# Patient Record
Sex: Female | Born: 1990 | Race: White | Hispanic: No | Marital: Single | Smoking: Current every day smoker
Health system: Southern US, Community
[De-identification: ages and names within clinical notes are randomized; demographics above are authoritative.]

## PROBLEM LIST (undated history)

## (undated) ENCOUNTER — Inpatient Hospital Stay (HOSPITAL_COMMUNITY): Payer: Self-pay

## (undated) DIAGNOSIS — O24419 Gestational diabetes mellitus in pregnancy, unspecified control: Secondary | ICD-10-CM

## (undated) DIAGNOSIS — F329 Major depressive disorder, single episode, unspecified: Secondary | ICD-10-CM

## (undated) DIAGNOSIS — F419 Anxiety disorder, unspecified: Secondary | ICD-10-CM

## (undated) DIAGNOSIS — D649 Anemia, unspecified: Secondary | ICD-10-CM

## (undated) DIAGNOSIS — IMO0002 Reserved for concepts with insufficient information to code with codable children: Secondary | ICD-10-CM

## (undated) DIAGNOSIS — R569 Unspecified convulsions: Secondary | ICD-10-CM

## (undated) DIAGNOSIS — J45909 Unspecified asthma, uncomplicated: Secondary | ICD-10-CM

## (undated) DIAGNOSIS — M797 Fibromyalgia: Secondary | ICD-10-CM

## (undated) DIAGNOSIS — R12 Heartburn: Secondary | ICD-10-CM

## (undated) DIAGNOSIS — O26899 Other specified pregnancy related conditions, unspecified trimester: Secondary | ICD-10-CM

## (undated) DIAGNOSIS — F32A Depression, unspecified: Secondary | ICD-10-CM

## (undated) DIAGNOSIS — K625 Hemorrhage of anus and rectum: Secondary | ICD-10-CM

## (undated) HISTORY — DX: Depression, unspecified: F32.A

## (undated) HISTORY — PX: CHOLECYSTECTOMY: SHX55

## (undated) HISTORY — DX: Hemorrhage of anus and rectum: K62.5

## (undated) HISTORY — DX: Anemia, unspecified: D64.9

## (undated) HISTORY — PX: WISDOM TOOTH EXTRACTION: SHX21

## (undated) HISTORY — DX: Fibromyalgia: M79.7

## (undated) HISTORY — DX: Reserved for concepts with insufficient information to code with codable children: IMO0002

## (undated) HISTORY — DX: Gestational diabetes mellitus in pregnancy, unspecified control: O24.419

## (undated) HISTORY — DX: Major depressive disorder, single episode, unspecified: F32.9

---
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
## 2008-02-26 ENCOUNTER — Emergency Department: Payer: Self-pay | Admitting: Emergency Medicine

## 2008-04-16 HISTORY — PX: COLONOSCOPY: SHX174

## 2008-05-12 ENCOUNTER — Emergency Department: Payer: Self-pay | Admitting: Internal Medicine

## 2008-05-26 ENCOUNTER — Emergency Department: Payer: Self-pay | Admitting: Emergency Medicine

## 2008-06-10 ENCOUNTER — Encounter: Payer: Self-pay | Admitting: Maternal & Fetal Medicine

## 2008-06-11 ENCOUNTER — Other Ambulatory Visit: Payer: Self-pay

## 2008-06-11 ENCOUNTER — Emergency Department: Payer: Self-pay

## 2008-06-14 ENCOUNTER — Ambulatory Visit: Payer: Self-pay | Admitting: Maternal & Fetal Medicine

## 2008-06-14 ENCOUNTER — Encounter: Payer: Self-pay | Admitting: Maternal & Fetal Medicine

## 2008-06-21 ENCOUNTER — Encounter: Payer: Self-pay | Admitting: Maternal & Fetal Medicine

## 2008-06-28 ENCOUNTER — Emergency Department: Payer: Self-pay | Admitting: Unknown Physician Specialty

## 2008-06-30 ENCOUNTER — Ambulatory Visit: Payer: Self-pay | Admitting: Maternal & Fetal Medicine

## 2008-07-02 ENCOUNTER — Emergency Department: Payer: Self-pay | Admitting: Emergency Medicine

## 2008-07-07 ENCOUNTER — Ambulatory Visit: Payer: Self-pay | Admitting: Gynecology

## 2008-07-15 ENCOUNTER — Other Ambulatory Visit: Payer: Self-pay | Admitting: Gynecology

## 2008-07-15 ENCOUNTER — Emergency Department (HOSPITAL_COMMUNITY): Admission: EM | Admit: 2008-07-15 | Discharge: 2008-07-16 | Payer: Self-pay | Admitting: Emergency Medicine

## 2008-07-26 ENCOUNTER — Other Ambulatory Visit: Admission: RE | Admit: 2008-07-26 | Discharge: 2008-07-26 | Payer: Self-pay | Admitting: Obstetrics & Gynecology

## 2008-07-26 ENCOUNTER — Ambulatory Visit: Payer: Self-pay | Admitting: Gynecology

## 2008-07-26 ENCOUNTER — Encounter: Payer: Self-pay | Admitting: Obstetrics & Gynecology

## 2008-08-04 ENCOUNTER — Ambulatory Visit: Payer: Self-pay | Admitting: Gynecology

## 2008-08-17 ENCOUNTER — Ambulatory Visit (HOSPITAL_COMMUNITY): Admission: RE | Admit: 2008-08-17 | Discharge: 2008-08-17 | Payer: Self-pay | Admitting: Gynecology

## 2008-08-18 ENCOUNTER — Ambulatory Visit: Payer: Self-pay | Admitting: Obstetrics and Gynecology

## 2008-08-27 ENCOUNTER — Inpatient Hospital Stay (HOSPITAL_COMMUNITY): Admission: AD | Admit: 2008-08-27 | Discharge: 2008-08-27 | Payer: Self-pay | Admitting: Obstetrics & Gynecology

## 2008-08-27 ENCOUNTER — Ambulatory Visit: Payer: Self-pay | Admitting: Obstetrics & Gynecology

## 2008-09-01 ENCOUNTER — Ambulatory Visit (HOSPITAL_COMMUNITY): Admission: RE | Admit: 2008-09-01 | Discharge: 2008-09-01 | Payer: Self-pay | Admitting: Gynecology

## 2008-09-23 ENCOUNTER — Ambulatory Visit: Payer: Self-pay | Admitting: Obstetrics & Gynecology

## 2008-10-01 ENCOUNTER — Emergency Department: Payer: Self-pay | Admitting: Emergency Medicine

## 2008-10-12 ENCOUNTER — Ambulatory Visit: Payer: Self-pay | Admitting: Obstetrics & Gynecology

## 2008-10-18 ENCOUNTER — Ambulatory Visit (HOSPITAL_COMMUNITY): Admission: RE | Admit: 2008-10-18 | Discharge: 2008-10-18 | Payer: Self-pay | Admitting: Gynecology

## 2008-10-26 ENCOUNTER — Encounter: Payer: Self-pay | Admitting: Family Medicine

## 2008-10-26 ENCOUNTER — Ambulatory Visit: Payer: Self-pay | Admitting: Obstetrics & Gynecology

## 2008-10-26 LAB — CONVERTED CEMR LAB
HCT: 34.8 % — ABNORMAL LOW (ref 36.0–46.0)
Hemoglobin: 11.3 g/dL — ABNORMAL LOW (ref 12.0–15.0)
MCHC: 32.5 g/dL (ref 30.0–36.0)
MCV: 90.2 fL (ref 78.0–100.0)
Platelets: 239 10*3/uL (ref 150–400)
RBC: 3.86 M/uL — ABNORMAL LOW (ref 3.87–5.11)
RDW: 13 % (ref 11.5–15.5)
WBC: 9.1 10*3/uL (ref 4.0–10.5)

## 2008-11-09 ENCOUNTER — Ambulatory Visit: Payer: Self-pay | Admitting: Family Medicine

## 2008-11-15 ENCOUNTER — Ambulatory Visit (HOSPITAL_COMMUNITY): Admission: RE | Admit: 2008-11-15 | Discharge: 2008-11-15 | Payer: Self-pay | Admitting: Gynecology

## 2008-11-23 ENCOUNTER — Ambulatory Visit: Payer: Self-pay | Admitting: Obstetrics & Gynecology

## 2008-11-29 ENCOUNTER — Ambulatory Visit: Payer: Self-pay | Admitting: Family Medicine

## 2008-12-02 ENCOUNTER — Ambulatory Visit: Payer: Self-pay | Admitting: Family Medicine

## 2008-12-06 ENCOUNTER — Ambulatory Visit: Payer: Self-pay | Admitting: Obstetrics and Gynecology

## 2008-12-06 ENCOUNTER — Ambulatory Visit (HOSPITAL_COMMUNITY): Admission: RE | Admit: 2008-12-06 | Discharge: 2008-12-06 | Payer: Self-pay | Admitting: Obstetrics & Gynecology

## 2008-12-09 ENCOUNTER — Ambulatory Visit (HOSPITAL_COMMUNITY): Admission: RE | Admit: 2008-12-09 | Discharge: 2008-12-09 | Payer: Self-pay | Admitting: Obstetrics & Gynecology

## 2008-12-11 ENCOUNTER — Inpatient Hospital Stay (HOSPITAL_COMMUNITY): Admission: AD | Admit: 2008-12-11 | Discharge: 2008-12-12 | Payer: Self-pay | Admitting: Obstetrics & Gynecology

## 2008-12-11 ENCOUNTER — Ambulatory Visit: Payer: Self-pay | Admitting: Advanced Practice Midwife

## 2008-12-13 ENCOUNTER — Ambulatory Visit: Payer: Self-pay | Admitting: Obstetrics and Gynecology

## 2008-12-16 ENCOUNTER — Ambulatory Visit (HOSPITAL_COMMUNITY): Admission: RE | Admit: 2008-12-16 | Discharge: 2008-12-16 | Payer: Self-pay | Admitting: Obstetrics & Gynecology

## 2008-12-20 ENCOUNTER — Ambulatory Visit: Payer: Self-pay | Admitting: Obstetrics and Gynecology

## 2008-12-22 ENCOUNTER — Ambulatory Visit (HOSPITAL_COMMUNITY): Admission: RE | Admit: 2008-12-22 | Discharge: 2008-12-22 | Payer: Self-pay | Admitting: Obstetrics & Gynecology

## 2008-12-27 ENCOUNTER — Ambulatory Visit: Payer: Self-pay | Admitting: Family Medicine

## 2008-12-27 LAB — CONVERTED CEMR LAB
Chlamydia, DNA Probe: NEGATIVE
GC Probe Amp, Genital: NEGATIVE

## 2008-12-28 ENCOUNTER — Encounter: Payer: Self-pay | Admitting: Family Medicine

## 2008-12-30 ENCOUNTER — Inpatient Hospital Stay (HOSPITAL_COMMUNITY): Admission: AD | Admit: 2008-12-30 | Discharge: 2008-12-30 | Payer: Self-pay | Admitting: Obstetrics & Gynecology

## 2009-01-01 ENCOUNTER — Ambulatory Visit: Payer: Self-pay | Admitting: Obstetrics & Gynecology

## 2009-01-01 ENCOUNTER — Inpatient Hospital Stay (HOSPITAL_COMMUNITY): Admission: AD | Admit: 2009-01-01 | Discharge: 2009-01-02 | Payer: Self-pay | Admitting: Obstetrics & Gynecology

## 2009-01-03 ENCOUNTER — Ambulatory Visit: Payer: Self-pay | Admitting: Obstetrics & Gynecology

## 2009-01-04 ENCOUNTER — Ambulatory Visit: Payer: Self-pay | Admitting: Obstetrics and Gynecology

## 2009-01-05 ENCOUNTER — Encounter: Payer: Self-pay | Admitting: Family Medicine

## 2009-01-05 LAB — CONVERTED CEMR LAB
Collection Interval-CRCL: 24 hr
Creatinine 24 HR UR: 997 mg/24hr (ref 700–1800)
Creatinine Clearance: 103 mL/min (ref 75–115)
Creatinine, Urine: 46.4 mg/dL
Protein, Ur: 108 mg/24hr — ABNORMAL HIGH (ref 50–100)

## 2009-01-06 ENCOUNTER — Ambulatory Visit (HOSPITAL_COMMUNITY): Admission: RE | Admit: 2009-01-06 | Discharge: 2009-01-06 | Payer: Self-pay | Admitting: Obstetrics & Gynecology

## 2009-01-07 ENCOUNTER — Ambulatory Visit: Payer: Self-pay | Admitting: Obstetrics and Gynecology

## 2009-01-10 ENCOUNTER — Ambulatory Visit: Payer: Self-pay | Admitting: Obstetrics & Gynecology

## 2009-01-11 ENCOUNTER — Inpatient Hospital Stay (HOSPITAL_COMMUNITY): Admission: RE | Admit: 2009-01-11 | Discharge: 2009-01-15 | Payer: Self-pay | Admitting: Family Medicine

## 2009-01-11 ENCOUNTER — Ambulatory Visit: Payer: Self-pay | Admitting: Family Medicine

## 2009-01-12 ENCOUNTER — Encounter: Payer: Self-pay | Admitting: Family Medicine

## 2009-01-19 ENCOUNTER — Ambulatory Visit: Payer: Self-pay | Admitting: Family Medicine

## 2009-01-20 ENCOUNTER — Encounter: Payer: Self-pay | Admitting: Family Medicine

## 2009-03-01 ENCOUNTER — Ambulatory Visit: Payer: Self-pay | Admitting: Family Medicine

## 2009-05-16 ENCOUNTER — Emergency Department: Payer: Self-pay | Admitting: Emergency Medicine

## 2009-10-26 IMAGING — US US FETAL BPP W/O NONSTRESS
1 series · 18 of 20 positions shown · non-contrast
Comparison: none

OBSTETRICAL ULTRASOUND:
 This ultrasound was performed in The [HOSPITAL], and the [facility] report will be stored to [REDACTED] PACS.

[Series 1: us fetal bpp w/o nonstress · 18 of 20 slices shown]
[im 1/20]
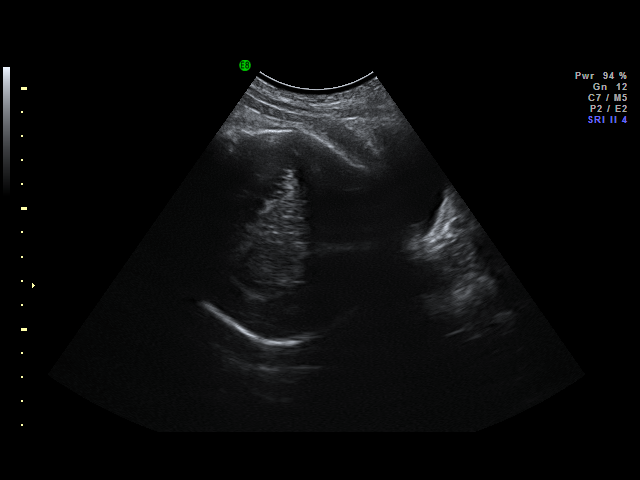
[im 2/20]
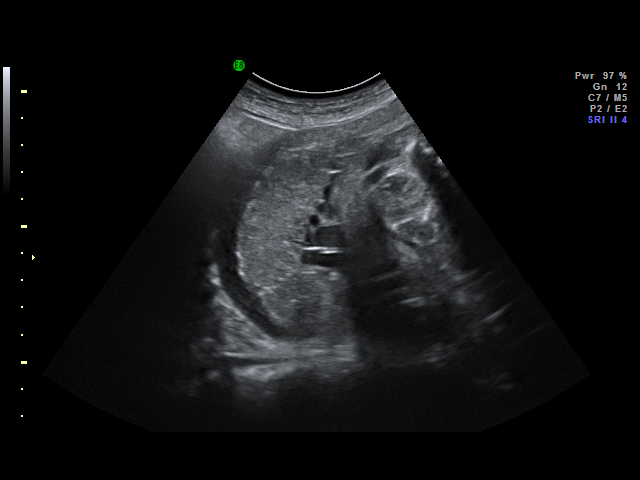
[im 3/20]
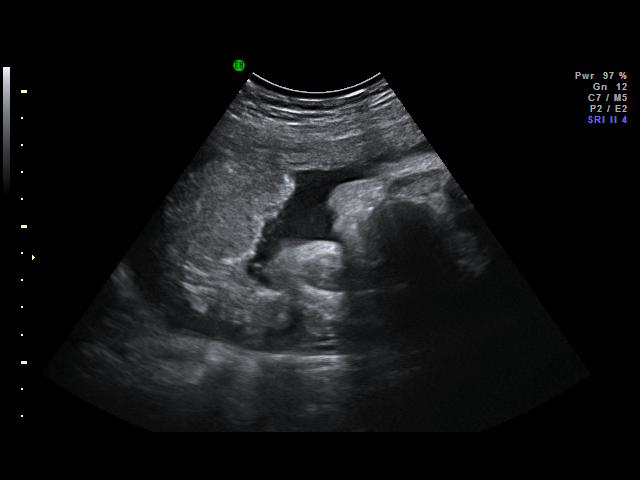
[im 4/20]
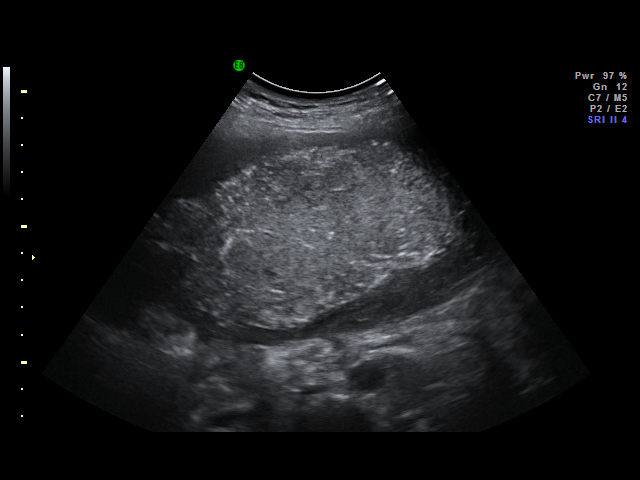
[im 6/20]
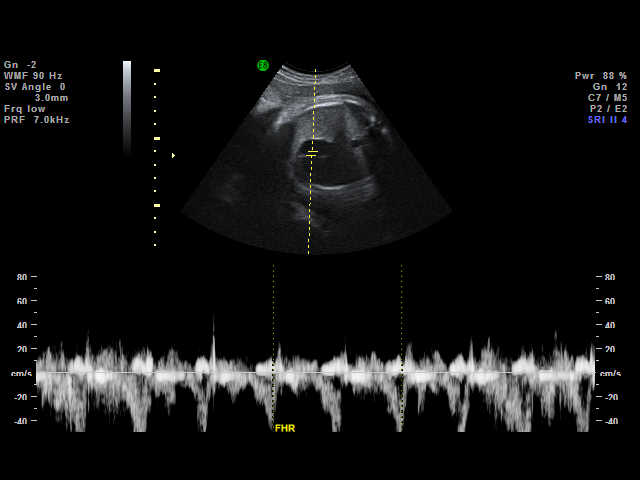
[im 7/20]
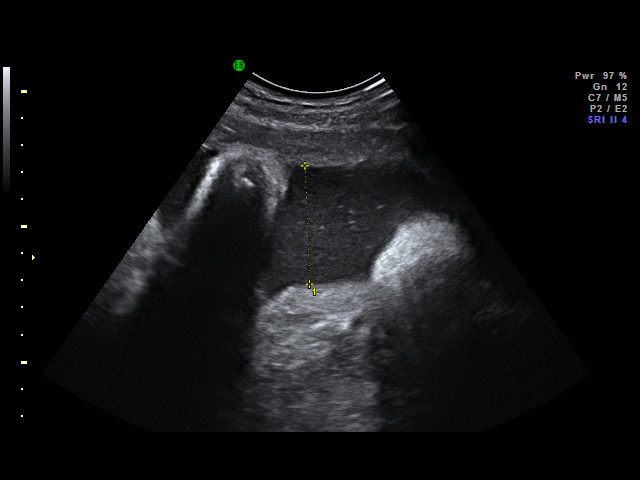
[im 8/20]
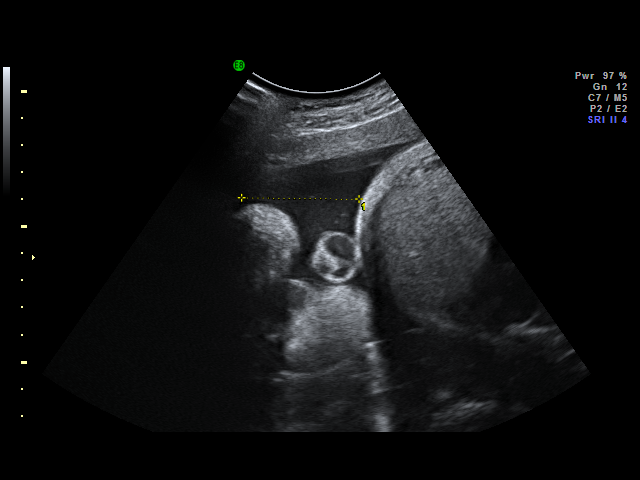
[im 9/20]
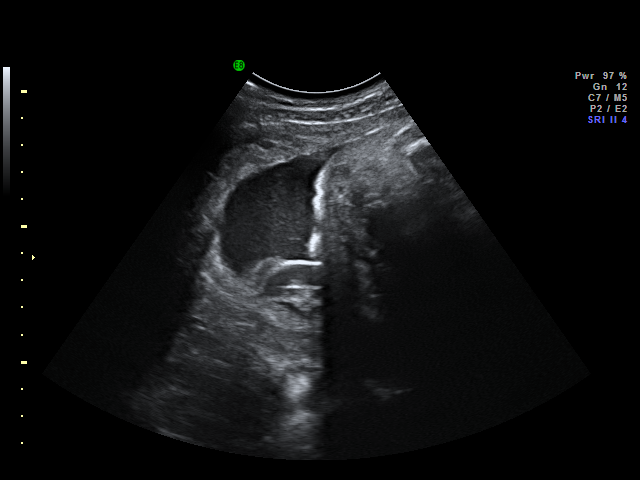
[im 10/20]
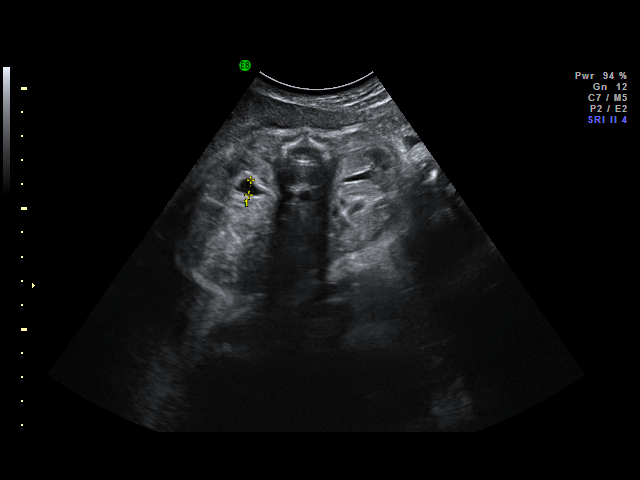
[im 11/20]
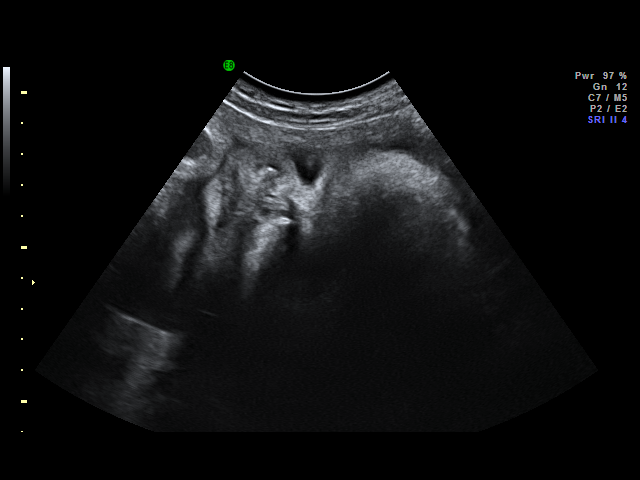
[im 12/20]
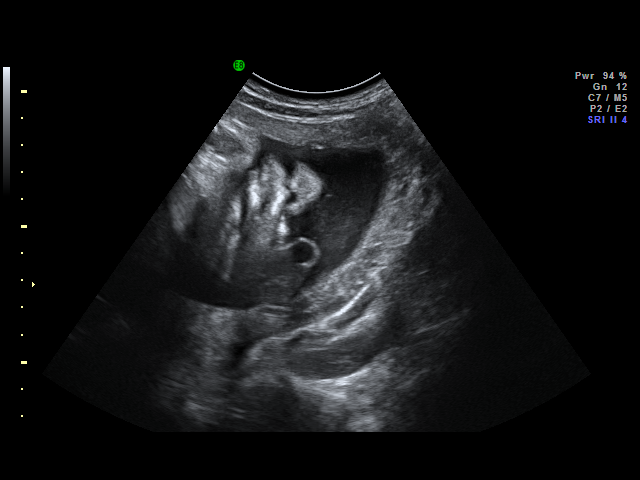
[im 13/20]
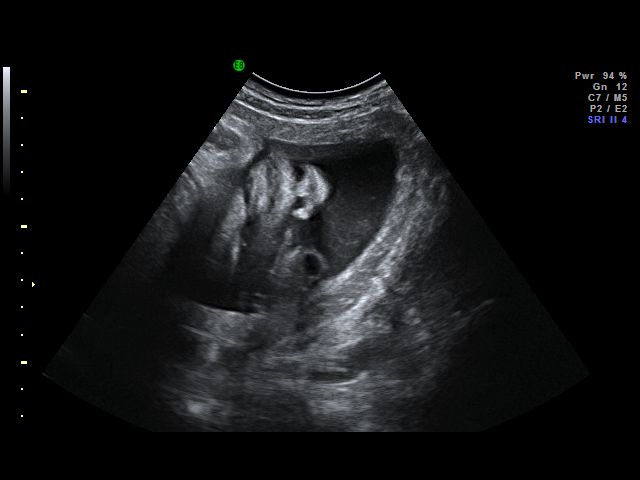
[im 14/20]
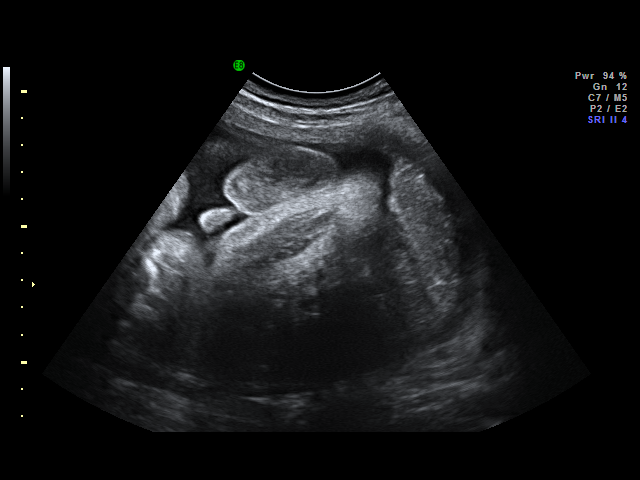
[im 16/20]
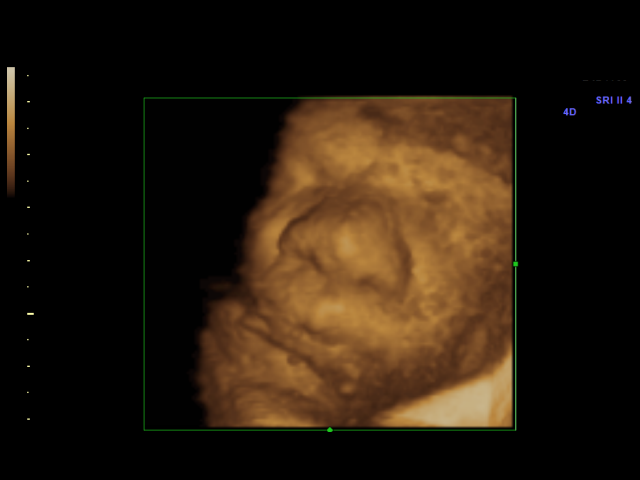
[im 17/20]
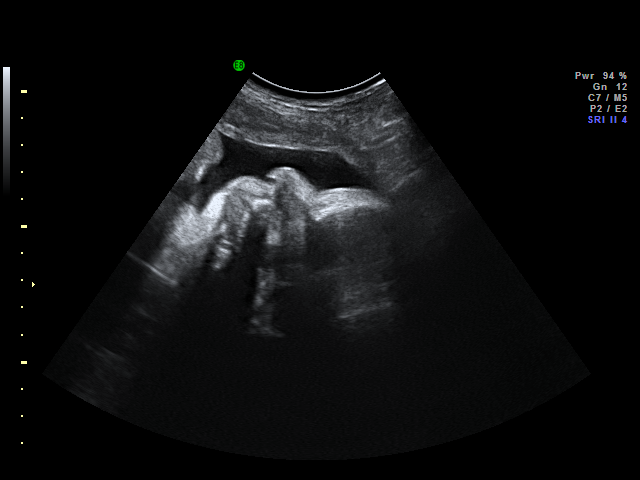
[im 18/20]
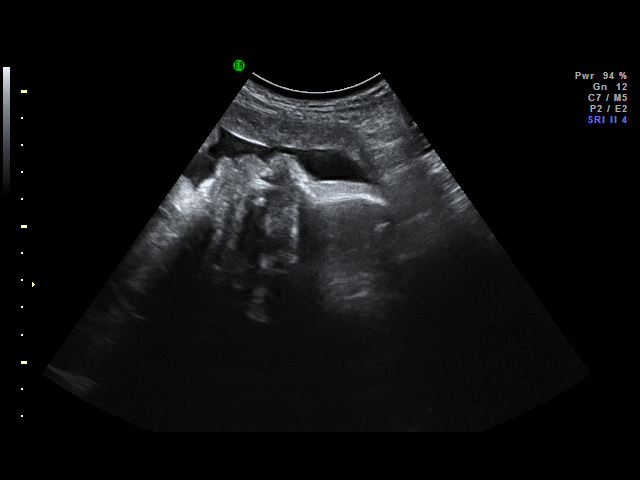
[im 19/20]
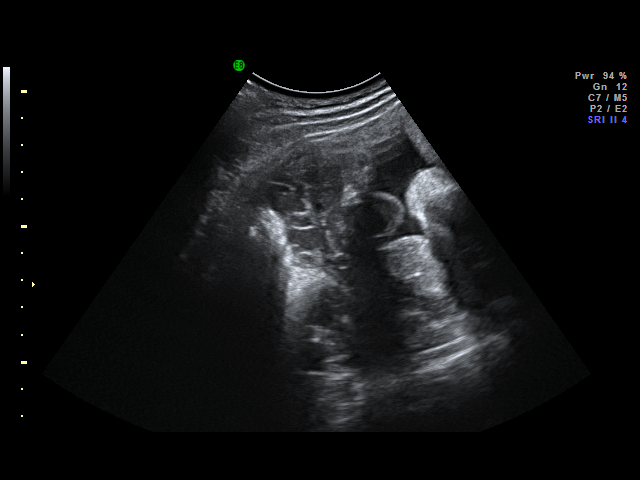
[im 20/20]
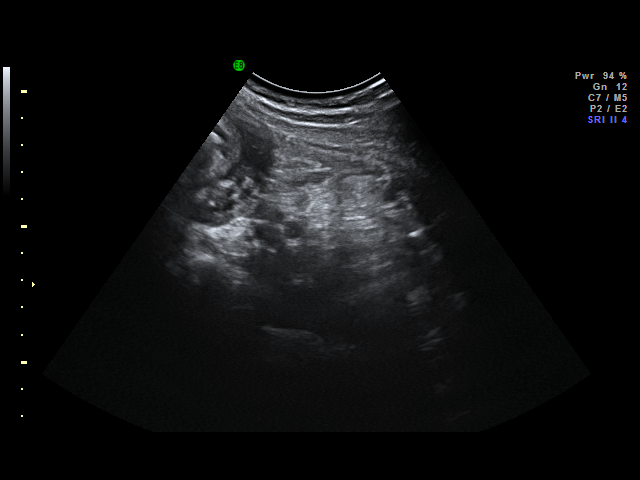

[18 of 20 positions shown; findings below may reference images not displayed]

IMPRESSION: [facility] has also been faxed to the ordering physician.

## 2010-03-04 ENCOUNTER — Emergency Department: Payer: Self-pay | Admitting: Emergency Medicine

## 2010-05-30 ENCOUNTER — Emergency Department: Payer: Self-pay | Admitting: Emergency Medicine

## 2010-06-01 ENCOUNTER — Encounter: Payer: Self-pay | Admitting: Obstetrics & Gynecology

## 2010-06-01 ENCOUNTER — Ambulatory Visit: Payer: Self-pay | Admitting: Obstetrics and Gynecology

## 2010-06-01 LAB — CONVERTED CEMR LAB
Antibody Screen: NEGATIVE
Eosinophils Absolute: 0.4 10*3/uL (ref 0.0–0.7)
Eosinophils Relative: 6 % — ABNORMAL HIGH (ref 0–5)
Hemoglobin: 10.9 g/dL — ABNORMAL LOW (ref 12.0–15.0)
Lymphs Abs: 2.2 10*3/uL (ref 0.7–4.0)
MCHC: 33.3 g/dL (ref 30.0–36.0)
Monocytes Absolute: 0.7 10*3/uL (ref 0.1–1.0)
Monocytes Relative: 9 % (ref 3–12)
RDW: 13.5 % (ref 11.5–15.5)
Rh Type: POSITIVE
Rubella: 137.1 intl units/mL — ABNORMAL HIGH
TSH: 3.027 microintl units/mL (ref 0.350–4.500)

## 2010-06-08 ENCOUNTER — Ambulatory Visit: Payer: Self-pay | Admitting: Obstetrics and Gynecology

## 2010-06-08 ENCOUNTER — Other Ambulatory Visit: Admission: RE | Admit: 2010-06-08 | Discharge: 2010-06-08 | Payer: Self-pay | Admitting: Obstetrics and Gynecology

## 2010-06-08 ENCOUNTER — Encounter: Payer: Self-pay | Admitting: Obstetrics & Gynecology

## 2010-06-08 LAB — CONVERTED CEMR LAB: hCG, Beta Chain, Quant, S: 16407.7 milliintl units/mL

## 2010-06-26 ENCOUNTER — Ambulatory Visit: Payer: Self-pay | Admitting: Obstetrics and Gynecology

## 2010-06-26 LAB — CONVERTED CEMR LAB
Ferritin: 14 ng/mL (ref 10–291)
Folate: >20 ng/mL

## 2010-07-07 ENCOUNTER — Encounter: Payer: Self-pay | Admitting: Cardiovascular Disease

## 2010-07-11 ENCOUNTER — Ambulatory Visit: Payer: Self-pay | Admitting: Obstetrics & Gynecology

## 2010-07-31 ENCOUNTER — Ambulatory Visit: Payer: Self-pay | Admitting: Obstetrics and Gynecology

## 2010-08-04 ENCOUNTER — Ambulatory Visit (HOSPITAL_COMMUNITY): Admission: RE | Admit: 2010-08-04 | Discharge: 2010-08-04 | Payer: Self-pay | Admitting: Obstetrics & Gynecology

## 2010-08-07 ENCOUNTER — Ambulatory Visit: Payer: Self-pay | Admitting: Obstetrics and Gynecology

## 2010-09-05 ENCOUNTER — Emergency Department: Payer: Self-pay | Admitting: Emergency Medicine

## 2010-09-07 ENCOUNTER — Ambulatory Visit: Payer: Self-pay | Admitting: Obstetrics and Gynecology

## 2010-09-11 ENCOUNTER — Ambulatory Visit (HOSPITAL_COMMUNITY): Admission: RE | Admit: 2010-09-11 | Discharge: 2010-09-11 | Payer: Self-pay | Admitting: Family Medicine

## 2010-09-13 ENCOUNTER — Ambulatory Visit: Payer: Self-pay | Admitting: Obstetrics and Gynecology

## 2010-10-10 ENCOUNTER — Encounter: Payer: Self-pay | Admitting: Obstetrics & Gynecology

## 2010-10-10 ENCOUNTER — Ambulatory Visit: Payer: Self-pay | Admitting: Obstetrics & Gynecology

## 2010-10-15 ENCOUNTER — Observation Stay: Payer: Self-pay | Admitting: Obstetrics and Gynecology

## 2010-10-23 ENCOUNTER — Ambulatory Visit: Payer: Self-pay | Admitting: Obstetrics and Gynecology

## 2010-11-08 ENCOUNTER — Ambulatory Visit: Payer: Self-pay | Admitting: Obstetrics and Gynecology

## 2010-11-18 ENCOUNTER — Inpatient Hospital Stay: Payer: Self-pay | Admitting: Obstetrics and Gynecology

## 2010-11-20 ENCOUNTER — Encounter: Payer: Self-pay | Admitting: Obstetrics & Gynecology

## 2010-11-20 ENCOUNTER — Ambulatory Visit: Payer: Self-pay | Admitting: Obstetrics & Gynecology

## 2010-11-20 LAB — CONVERTED CEMR LAB
HCT: 31.6 % — ABNORMAL LOW (ref 36.0–46.0)
HIV: NONREACTIVE
MCV: 88.5 fL (ref 78.0–100.0)
RBC: 3.57 M/uL — ABNORMAL LOW (ref 3.87–5.11)
WBC: 13.7 10*3/uL — ABNORMAL HIGH (ref 4.0–10.5)

## 2010-11-22 ENCOUNTER — Ambulatory Visit: Payer: Self-pay | Admitting: Obstetrics & Gynecology

## 2010-11-24 ENCOUNTER — Inpatient Hospital Stay (HOSPITAL_COMMUNITY)
Admission: AD | Admit: 2010-11-24 | Discharge: 2010-11-24 | Payer: Self-pay | Source: Home / Self Care | Admitting: Obstetrics and Gynecology

## 2010-12-04 ENCOUNTER — Ambulatory Visit: Payer: Self-pay | Admitting: Obstetrics and Gynecology

## 2010-12-05 ENCOUNTER — Ambulatory Visit: Payer: Self-pay | Admitting: Obstetrics & Gynecology

## 2010-12-13 ENCOUNTER — Ambulatory Visit: Payer: Self-pay | Admitting: Family Medicine

## 2010-12-13 ENCOUNTER — Ambulatory Visit: Payer: Self-pay | Admitting: Obstetrics & Gynecology

## 2010-12-18 ENCOUNTER — Ambulatory Visit: Payer: Self-pay | Admitting: Obstetrics and Gynecology

## 2010-12-20 ENCOUNTER — Ambulatory Visit: Payer: Self-pay | Admitting: Obstetrics & Gynecology

## 2011-01-05 ENCOUNTER — Inpatient Hospital Stay (HOSPITAL_COMMUNITY)
Admission: AD | Admit: 2011-01-05 | Discharge: 2011-01-06 | Payer: Self-pay | Source: Home / Self Care | Attending: Family Medicine | Admitting: Family Medicine

## 2011-01-08 ENCOUNTER — Inpatient Hospital Stay (HOSPITAL_COMMUNITY)
Admission: AD | Admit: 2011-01-08 | Discharge: 2011-01-09 | Payer: Self-pay | Source: Home / Self Care | Attending: Obstetrics & Gynecology | Admitting: Obstetrics & Gynecology

## 2011-01-08 ENCOUNTER — Ambulatory Visit
Admission: RE | Admit: 2011-01-08 | Discharge: 2011-01-08 | Payer: Self-pay | Source: Home / Self Care | Attending: Obstetrics and Gynecology | Admitting: Obstetrics and Gynecology

## 2011-01-10 ENCOUNTER — Inpatient Hospital Stay (HOSPITAL_COMMUNITY)
Admission: AD | Admit: 2011-01-10 | Discharge: 2011-01-11 | Payer: Self-pay | Source: Home / Self Care | Attending: Obstetrics and Gynecology | Admitting: Obstetrics and Gynecology

## 2011-01-11 ENCOUNTER — Ambulatory Visit
Admission: RE | Admit: 2011-01-11 | Discharge: 2011-01-11 | Payer: Self-pay | Source: Home / Self Care | Attending: Obstetrics & Gynecology | Admitting: Obstetrics & Gynecology

## 2011-01-14 ENCOUNTER — Inpatient Hospital Stay (HOSPITAL_COMMUNITY)
Admission: AD | Admit: 2011-01-14 | Discharge: 2011-01-14 | Payer: Self-pay | Source: Home / Self Care | Attending: Family Medicine | Admitting: Family Medicine

## 2011-01-15 LAB — URINALYSIS, ROUTINE W REFLEX MICROSCOPIC
Bilirubin Urine: NEGATIVE
Bilirubin Urine: NEGATIVE
Bilirubin Urine: NEGATIVE
Hgb urine dipstick: NEGATIVE
Hgb urine dipstick: NEGATIVE
Hgb urine dipstick: NEGATIVE
Ketones, ur: NEGATIVE mg/dL
Ketones, ur: NEGATIVE mg/dL
Ketones, ur: NEGATIVE mg/dL
Nitrite: NEGATIVE
Nitrite: NEGATIVE
Nitrite: NEGATIVE
Protein, ur: NEGATIVE mg/dL
Protein, ur: NEGATIVE mg/dL
Protein, ur: NEGATIVE mg/dL
Specific Gravity, Urine: <1.005 — ABNORMAL LOW (ref 1.005–1.030)
Specific Gravity, Urine: <1.005 — ABNORMAL LOW (ref 1.005–1.030)
Specific Gravity, Urine: <1.005 — ABNORMAL LOW (ref 1.005–1.030)
Urine Glucose, Fasting: NEGATIVE mg/dL
Urine Glucose, Fasting: NEGATIVE mg/dL
Urine Glucose, Fasting: NEGATIVE mg/dL
Urobilinogen, UA: 0.2 mg/dL (ref 0.0–1.0)
Urobilinogen, UA: 0.2 mg/dL (ref 0.0–1.0)
Urobilinogen, UA: 1 mg/dL (ref 0.0–1.0)
pH: 6 (ref 5.0–8.0)
pH: 6 (ref 5.0–8.0)
pH: 6.5 (ref 5.0–8.0)

## 2011-01-15 LAB — URINE MICROSCOPIC-ADD ON

## 2011-01-15 LAB — COMPREHENSIVE METABOLIC PANEL
ALT: 10 U/L (ref 0–35)
AST: 20 U/L (ref 0–37)
Albumin: 2.8 g/dL — ABNORMAL LOW (ref 3.5–5.2)
Alkaline Phosphatase: 191 U/L — ABNORMAL HIGH (ref 39–117)
BUN: 2 mg/dL — ABNORMAL LOW (ref 6–23)
CO2: 22 mEq/L (ref 19–32)
Calcium: 8.5 mg/dL (ref 8.4–10.5)
Chloride: 105 mEq/L (ref 96–112)
Creatinine, Ser: 0.48 mg/dL (ref 0.4–1.2)
GFR calc Af Amer: >60 mL/min (ref >60)
GFR calc non Af Amer: >60 mL/min (ref >60)
Glucose, Bld: 83 mg/dL (ref 70–99)
Potassium: 3.1 mEq/L — ABNORMAL LOW (ref 3.5–5.1)
Sodium: 135 mEq/L (ref 135–145)
Total Bilirubin: 0.3 mg/dL (ref 0.3–1.2)
Total Protein: 6.1 g/dL (ref 6.0–8.3)

## 2011-01-15 LAB — WET PREP, GENITAL
Clue Cells Wet Prep HPF POC: NONE SEEN
Trich, Wet Prep: NONE SEEN
Yeast Wet Prep HPF POC: NONE SEEN

## 2011-01-15 LAB — AMNISURE RUPTURE OF MEMBRANE (ROM) NOT AT ARMC: Amnisure ROM: NEGATIVE

## 2011-01-15 LAB — CBC
HCT: 31.2 % — ABNORMAL LOW (ref 36.0–46.0)
Hemoglobin: 10.4 g/dL — ABNORMAL LOW (ref 12.0–15.0)
MCH: 27.2 pg (ref 26.0–34.0)
MCHC: 33.3 g/dL (ref 30.0–36.0)
MCV: 81.7 fL (ref 78.0–100.0)
Platelets: 253 10*3/uL (ref 150–400)
RBC: 3.82 MIL/uL — ABNORMAL LOW (ref 3.87–5.11)
RDW: 14.3 % (ref 11.5–15.5)
WBC: 7.5 10*3/uL (ref 4.0–10.5)

## 2011-01-17 ENCOUNTER — Ambulatory Visit
Admission: RE | Admit: 2011-01-17 | Discharge: 2011-01-17 | Payer: Self-pay | Source: Home / Self Care | Attending: Obstetrics & Gynecology | Admitting: Obstetrics & Gynecology

## 2011-01-17 ENCOUNTER — Encounter: Payer: Self-pay | Admitting: Obstetrics & Gynecology

## 2011-01-17 LAB — CONVERTED CEMR LAB: GC Probe Amp, Genital: NEGATIVE

## 2011-01-23 ENCOUNTER — Ambulatory Visit (HOSPITAL_COMMUNITY)
Admission: RE | Admit: 2011-01-23 | Discharge: 2011-01-23 | Payer: Self-pay | Source: Home / Self Care | Attending: Obstetrics & Gynecology | Admitting: Obstetrics & Gynecology

## 2011-01-24 ENCOUNTER — Inpatient Hospital Stay (HOSPITAL_COMMUNITY)
Admission: AD | Admit: 2011-01-24 | Discharge: 2011-01-24 | Payer: Self-pay | Source: Home / Self Care | Attending: Obstetrics and Gynecology | Admitting: Obstetrics and Gynecology

## 2011-01-25 ENCOUNTER — Ambulatory Visit
Admission: RE | Admit: 2011-01-25 | Discharge: 2011-01-25 | Payer: Self-pay | Source: Home / Self Care | Attending: Obstetrics & Gynecology | Admitting: Obstetrics & Gynecology

## 2011-01-29 ENCOUNTER — Inpatient Hospital Stay (HOSPITAL_COMMUNITY)
Admission: RE | Admit: 2011-01-29 | Discharge: 2011-01-29 | Payer: Self-pay | Source: Home / Self Care | Attending: Obstetrics & Gynecology | Admitting: Obstetrics & Gynecology

## 2011-01-29 LAB — SURGICAL PCR SCREEN: MRSA, PCR: NEGATIVE

## 2011-01-29 LAB — RPR: RPR Ser Ql: NONREACTIVE

## 2011-01-30 LAB — CBC
HCT: 34.4 % — ABNORMAL LOW (ref 36.0–46.0)
MCHC: 31.7 g/dL (ref 30.0–36.0)
Platelets: 248 10*3/uL (ref 150–400)
RDW: 15.2 % (ref 11.5–15.5)
WBC: 9.5 10*3/uL (ref 4.0–10.5)

## 2011-02-01 ENCOUNTER — Encounter: Payer: Medicaid Other | Admitting: Obstetrics & Gynecology

## 2011-02-01 DIAGNOSIS — O169 Unspecified maternal hypertension, unspecified trimester: Secondary | ICD-10-CM

## 2011-02-01 DIAGNOSIS — O34219 Maternal care for unspecified type scar from previous cesarean delivery: Secondary | ICD-10-CM

## 2011-02-01 DIAGNOSIS — Z3480 Encounter for supervision of other normal pregnancy, unspecified trimester: Secondary | ICD-10-CM

## 2011-02-04 ENCOUNTER — Other Ambulatory Visit: Payer: Self-pay | Admitting: Physician Assistant

## 2011-02-04 ENCOUNTER — Inpatient Hospital Stay (HOSPITAL_COMMUNITY)
Admission: AD | Admit: 2011-02-04 | Discharge: 2011-02-07 | DRG: 766 | Disposition: A | Discharge status: Home / Self Care | Payer: Medicaid Other | Source: Ambulatory Visit | Attending: Obstetrics & Gynecology | Admitting: Obstetrics & Gynecology

## 2011-02-04 ENCOUNTER — Other Ambulatory Visit: Payer: Self-pay

## 2011-02-04 DIAGNOSIS — O1490 Unspecified pre-eclampsia, unspecified trimester: Secondary | ICD-10-CM

## 2011-02-04 DIAGNOSIS — O34219 Maternal care for unspecified type scar from previous cesarean delivery: Principal | ICD-10-CM | POA: Diagnosis present

## 2011-02-04 DIAGNOSIS — O4290 Premature rupture of membranes, unspecified as to length of time between rupture and onset of labor, unspecified weeks of gestation: Secondary | ICD-10-CM | POA: Diagnosis present

## 2011-02-04 LAB — COMPREHENSIVE METABOLIC PANEL
CO2: 23 mEq/L (ref 19–32)
Calcium: 8.6 mg/dL (ref 8.4–10.5)
Creatinine, Ser: 0.45 mg/dL (ref 0.4–1.2)
GFR calc Af Amer: >60 mL/min (ref >60)
GFR calc non Af Amer: >60 mL/min (ref >60)
Glucose, Bld: 81 mg/dL (ref 70–99)
Total Protein: 5.8 g/dL — ABNORMAL LOW (ref 6.0–8.3)

## 2011-02-04 LAB — CBC
HCT: 31 % — ABNORMAL LOW (ref 36.0–46.0)
Hemoglobin: 10.4 g/dL — ABNORMAL LOW (ref 12.0–15.0)
MCH: 26.3 pg (ref 26.0–34.0)
MCHC: 33.5 g/dL (ref 30.0–36.0)
RDW: 15.9 % — ABNORMAL HIGH (ref 11.5–15.5)

## 2011-02-04 LAB — AMNISURE RUPTURE OF MEMBRANE (ROM) NOT AT ARMC: Amnisure ROM: POSITIVE

## 2011-02-04 LAB — PROTEIN / CREATININE RATIO, URINE
Protein Creatinine Ratio: 0.2 — ABNORMAL HIGH (ref 0.00–0.15)
Total Protein, Urine: 16 mg/dL

## 2011-02-04 LAB — RPR: RPR Ser Ql: NONREACTIVE

## 2011-02-04 LAB — URIC ACID: Uric Acid, Serum: 3.7 mg/dL (ref 2.4–7.0)

## 2011-02-05 ENCOUNTER — Inpatient Hospital Stay (HOSPITAL_COMMUNITY)
Admission: RE | Admit: 2011-02-05 | Payer: Medicaid Other | Source: Home / Self Care | Admitting: Obstetrics & Gynecology

## 2011-02-05 DIAGNOSIS — O34219 Maternal care for unspecified type scar from previous cesarean delivery: Secondary | ICD-10-CM

## 2011-02-05 DIAGNOSIS — O4290 Premature rupture of membranes, unspecified as to length of time between rupture and onset of labor, unspecified weeks of gestation: Secondary | ICD-10-CM

## 2011-02-05 LAB — CBC
MCH: 26.1 pg (ref 26.0–34.0)
MCHC: 31.6 g/dL (ref 30.0–36.0)
Platelets: 203 10*3/uL (ref 150–400)
RDW: 16 % — ABNORMAL HIGH (ref 11.5–15.5)

## 2011-02-12 ENCOUNTER — Ambulatory Visit: Payer: Self-pay | Admitting: Pediatrics

## 2011-02-12 DIAGNOSIS — Z09 Encounter for follow-up examination after completed treatment for conditions other than malignant neoplasm: Secondary | ICD-10-CM

## 2011-02-14 NOTE — Op Note (Signed)
  [name], Kim [name]             ACCOUNT NO.:  [id number]  MEDICAL RECORD NO.:  [id number]           PATIENT TYPE:  I  LOCATION:  9130                          FACILITY:  [facility]  PHYSICIAN:  Lesly Dukes, M.D. DATE OF BIRTH:  1990/09/15  DATE OF PROCEDURE: DATE OF DISCHARGE:                              OPERATIVE REPORT   PREOPERATIVE DIAGNOSIS:  A 21 year old G2, P1-0-1 at 68 and 1 for repeat cesarean section for premature rupture of membranes.  POSTOPERATIVE DIAGNOSIS:  A 21 year old G2, P1-0-1 at 7 and 1 for repeat cesarean section for premature rupture of membranes.  PROCEDURE:  Repeat low transverse cesarean section.  SURGEON:  Lesly Dukes, MD  ANESTHESIA:  Spinal.  FINDINGS:  Viable female infant, Apgars 9 at 1 minute and 9 at 5 minute. Grossly normal uterus, ovaries, and fallopian tubes, and moderate mount of adhesions involving the anterior abdominal wall and the fascia.  SPECIMENS:  None.  ESTIMATED BLOOD LOSS:  100.  COMPLICATIONS:  None.  PROCEDURE IN DETAIL:  After informed consent was obtained, the patient was taken to the operating room where spinal anesthesia was found to be adequate.  The patient was placed in dorsal supine position with leftward tilt.  SCDs were on lower extremities and 2 g of Ancef were given IV.  A Pfannenstiel skin incision was made with a scalpel and carried down to fascia.  The fascia was incised in the midline.  This incision was extended bilaterally with the Mayo scissors.  The superior and inferior aspects of the fascial incision were grasped with Kocher clamps, tented up, and dissected off sharply and bluntly from the underlying layers of rectus muscles.  Rectus muscles were separated in midline.  Peritoneum was entered sharply and this incision was entered both superiorly and inferiorly with good visualization of the bladder. An Alexis self-retaining tractor was placed into the abdominal cavity. The bladder flap was  made and the bladder blade was inserted.  The uterine incision was made in transverse fashion in lower uterine segment.  This incision was extended bluntly,  head delivered atraumatically.  This baby's body was delivered easily.  The cord was clamped and cut.  Baby was handed off to the awaiting pediatrician. Placenta delivered manually with three-vessel cord.  The uterus was cleared of all clots and debris.  The uterine incision was closed with 0 Vicryl in a running locked fashion with good hemostasis.  The peritoneal cavity was copiously irrigated and again the uterus noted to be hemostatic off tension.  The peritoneum and rectus muscles noted to be hemostatic.  The fascia was closed with 0 Vicryl in a running fashion. Subcutaneous tissue was copiously irrigated and found to be hemostatic. The skin was closed with staples.  The patient tolerated procedure well. Sponge, lap, instrument, and needle count were correct x2.  The patient went to recovery room in stable condition.     Lesly Dukes, M.D.     Lora Paula  D:  02/04/2011  T:  02/04/2011  Job:  [id number]  Electronically Signed by Elsie Lincoln M.D. on 02/14/2011 07:15:33 PM

## 2011-02-19 NOTE — Discharge Summary (Signed)
  [name], [name]             ACCOUNT NO.:  [id number]  MEDICAL RECORD NO.:  [id number]           PATIENT TYPE:  I  LOCATION:  9130                          FACILITY:  [facility]  PHYSICIAN:  Kim [name], M.D.   DATE OF BIRTH:  07/14/1990  DATE OF ADMISSION:  02/04/2011 DATE OF DISCHARGE:  02/07/2011                              DISCHARGE SUMMARY   ATTENDING:  Shelbie Proctor. Shawnie Pons, MD  ADMISSION DIAGNOSES: 1. Intrauterine pregnancy at 39.1 weeks. 2. Previous cesarean section. 3. Premature rupture of membranes. 4. Repeat cesarean section for premature rupture of membranes and 2     previous cesarean sections.  DISCHARGE DIAGNOSES:  Status post repeat low transverse cesarean section for premature rupture of membranes.  DATE OF PROCEDURE:  February 04, 2011.  PROCEDURE:  A repeat low transverse cesarean section.  SURGEON:  Kim Dukes, MD.  Delivery of a viable female infant, Apgars 9 at 1 minute, 9 at 5 minutes, weight 6 pounds 9 ounces.  LABORATORY DATA:  CBC on admission; white count 10.5, hemoglobin 10.4, hematocrit 31.0, platelets 235.  Postop CBC; white count 13.4, hemoglobin 8.7, hematocrit 27.5, platelet count 203.  HOSPITAL COURSE:  This is a 21 year old G2, P2 at 39.1 weeks here for repeat C-section and premature rupture of membranes.  The patient was taken to the operating room where spinal anesthesia was found to be adequate.  Prior to the procedure, she received 2 doses of mag sulfate. Please see procedure note for further details.  Cord pH is pending. Agpars score is 9 and 9.  Postoperative course was benign.  The patient's C-section incision remained dry with staples intact.  Because the patient has had previous C-sections, we decided to leave the staples in for 2 more days and the [employer] nurse is to remove the staples on February 09, 2011.  The patient is to follow up with [facility] in 4-6  weeks.  The patient plans on breastfeeding and using  Depo-Provera for birth control.  DISPOSITION:  The patient was medically stable to be discharged home.  DISCHARGE MEDICATIONS: 1. Percocet 5/325 one tablet by mouth every 6 hours p.r.n. for pain. 2. Ibuprofen 600 mg every 6 hours 1 tablet by mouth p.r.n. for pain. 3. Colace 100 mg 1 tablet by mouth twice daily for postpartum     constipation.      ______________________________ Kim Lister, MD   ______________________________ Kim [name], M.D.    ID/MEDQ  D:  02/07/2011  T:  02/08/2011  Job:  [id number]  Electronically Signed by Kim Lister MD on 02/15/2011 09:01:33 PM Electronically Signed by Kim [name] M.D. on 02/19/2011 09:38:36 AM

## 2011-03-13 LAB — URINALYSIS, ROUTINE W REFLEX MICROSCOPIC
Glucose, UA: NEGATIVE mg/dL
Ketones, ur: NEGATIVE mg/dL
Leukocytes, UA: NEGATIVE
Nitrite: NEGATIVE
Protein, ur: NEGATIVE mg/dL
Urobilinogen, UA: 0.2 mg/dL (ref 0.0–1.0)

## 2011-03-13 LAB — URINE MICROSCOPIC-ADD ON

## 2011-03-15 ENCOUNTER — Ambulatory Visit: Payer: Medicaid Other | Admitting: Obstetrics & Gynecology

## 2011-03-15 LAB — ABO/RH: ABO/RH(D): O POS

## 2011-03-15 LAB — URINALYSIS, ROUTINE W REFLEX MICROSCOPIC
Bilirubin Urine: NEGATIVE
Glucose, UA: NEGATIVE mg/dL
Nitrite: NEGATIVE
Specific Gravity, Urine: 1.015 (ref 1.005–1.030)
pH: 6.5 (ref 5.0–8.0)

## 2011-03-15 LAB — URINE MICROSCOPIC-ADD ON

## 2011-03-21 ENCOUNTER — Ambulatory Visit: Payer: Medicaid Other | Admitting: Obstetrics & Gynecology

## 2011-03-23 NOTE — Assessment & Plan Note (Signed)
NAME:  Kim [name], Kim [name]               ACCOUNT NO.:  [id number]  MEDICAL RECORD NO.:  [id number]           PATIENT TYPE:  LOCATION:  [facility] at [facility]           FACILITY:  PHYSICIAN:  Tinnie Gens, MD             DATE OF BIRTH:  DATE OF SERVICE:                                 CLINIC NOTE  CHIEF COMPLAINT:  Postpartum check.  HISTORY OF PRESENT ILLNESS:  The patient is a 21 year old gravida 2, para 2 who had a repeat C-section approximately 6 weeks ago of a female infant as an elective repeat.  The patient declined birth control at that time and desires an IUD for 5 years now.  The patient reports her mood is good, her baby sleeps through the night.  She is breast and bottle feeding.  Her pregnancy was uncomplicated.  She reports that her mood is good.  PHYSICAL EXAMINATION TODAY:  VITAL SIGNS:  Her weight is 120 almost down to 118, which she was before pregnancy, blood pressure is 114/76, pulse is 109. GENERAL:  She is a well-developed, well-nourished female, in no acute distress. ABDOMEN:  Soft, nontender, nondistended.  Her incision is well healed. GENITOURINARY:  Normal external female genitalia.  BUS normal.  Vagina pink and rugated.  Cervix nulliparous and without lesion.  Uterus is small, anteverted.  No adnexal mass or tenderness.  IMPRESSION:  Postpartum check, doing well.  PLAN:  Return next week for Mirena IUD insertion, remain abstinent until which time that gets done.          ______________________________ Tinnie Gens, MD    TP/MEDQ  D:  03/21/2011  T:  03/22/2011  Job:  [id number]

## 2011-03-29 ENCOUNTER — Ambulatory Visit: Payer: Medicaid Other | Admitting: Obstetrics & Gynecology

## 2011-03-29 DIAGNOSIS — Z30430 Encounter for insertion of intrauterine contraceptive device: Secondary | ICD-10-CM

## 2011-04-16 LAB — CBC
HCT: 25.7 % — ABNORMAL LOW (ref 36.0–46.0)
HCT: 35.3 % — ABNORMAL LOW (ref 36.0–46.0)
HCT: 25.9 % — ABNORMAL LOW (ref 36.0–46.0)
Hemoglobin: 11.6 g/dL — ABNORMAL LOW (ref 12.0–15.0)
Hemoglobin: 8.6 g/dL — ABNORMAL LOW (ref 12.0–15.0)
MCHC: 32.9 g/dL (ref 30.0–36.0)
MCHC: 33 g/dL (ref 30.0–36.0)
MCHC: 33.7 g/dL (ref 30.0–36.0)
MCV: 87.1 fL (ref 78.0–100.0)
MCV: 87.8 fL (ref 78.0–100.0)
Platelets: 148 10*3/uL — ABNORMAL LOW (ref 150–400)
Platelets: 221 10*3/uL (ref 150–400)
RBC: 3.68 MIL/uL — ABNORMAL LOW (ref 3.87–5.11)
RBC: 3.8 MIL/uL — ABNORMAL LOW (ref 3.87–5.11)
RBC: 4.05 MIL/uL (ref 3.87–5.11)
RDW: 14.1 % (ref 11.5–15.5)
RDW: 14.4 % (ref 11.5–15.5)
RDW: 14.7 % (ref 11.5–15.5)
RDW: 14.9 % (ref 11.5–15.5)
RDW: 15.2 % (ref 11.5–15.5)
WBC: 10.1 10*3/uL (ref 4.0–10.5)

## 2011-04-16 LAB — URINALYSIS, ROUTINE W REFLEX MICROSCOPIC
Bilirubin Urine: NEGATIVE
Bilirubin Urine: NEGATIVE
Glucose, UA: NEGATIVE mg/dL
Hgb urine dipstick: NEGATIVE
Ketones, ur: NEGATIVE mg/dL
Nitrite: NEGATIVE
Nitrite: NEGATIVE
Protein, ur: NEGATIVE mg/dL
Specific Gravity, Urine: 1.015 (ref 1.005–1.030)
Specific Gravity, Urine: <1.005 — ABNORMAL LOW (ref 1.005–1.030)
Urobilinogen, UA: 0.2 mg/dL (ref 0.0–1.0)
Urobilinogen, UA: 0.2 mg/dL (ref 0.0–1.0)
pH: 6.5 (ref 5.0–8.0)

## 2011-04-16 LAB — COMPREHENSIVE METABOLIC PANEL
ALT: 10 U/L (ref 0–35)
ALT: 18 U/L (ref 0–35)
AST: 19 U/L (ref 0–37)
AST: 26 U/L (ref 0–37)
Albumin: 2.7 g/dL — ABNORMAL LOW (ref 3.5–5.2)
CO2: 20 mEq/L (ref 19–32)
Calcium: 8.3 mg/dL — ABNORMAL LOW (ref 8.4–10.5)
Calcium: 8.4 mg/dL (ref 8.4–10.5)
GFR calc Af Amer: >60 mL/min (ref >60)
GFR calc Af Amer: >60 mL/min (ref >60)
Sodium: 134 mEq/L — ABNORMAL LOW (ref 135–145)
Sodium: 137 mEq/L (ref 135–145)
Total Protein: 5.3 g/dL — ABNORMAL LOW (ref 6.0–8.3)
Total Protein: 5.8 g/dL — ABNORMAL LOW (ref 6.0–8.3)

## 2011-04-16 LAB — GLUCOSE, CAPILLARY
Glucose-Capillary: 109 mg/dL — ABNORMAL HIGH (ref 70–99)
Glucose-Capillary: 208 mg/dL — ABNORMAL HIGH (ref 70–99)
Glucose-Capillary: 71 mg/dL (ref 70–99)
Glucose-Capillary: 79 mg/dL (ref 70–99)
Glucose-Capillary: 87 mg/dL (ref 70–99)
Glucose-Capillary: 87 mg/dL (ref 70–99)
Glucose-Capillary: 87 mg/dL (ref 70–99)
Glucose-Capillary: 92 mg/dL (ref 70–99)
Glucose-Capillary: 97 mg/dL (ref 70–99)

## 2011-05-15 NOTE — Assessment & Plan Note (Signed)
NAME:  CIENNA, DUMAIS               ACCOUNT NO.:  [id number]   MEDICAL RECORD NO.:  [id number]          PATIENT TYPE:  POB   LOCATION:  [facility] at [facility]         FACILITY:  [facility]   PHYSICIAN:  Argentina Donovan, MD        DATE OF BIRTH:  1990-06-09   DATE OF SERVICE:  07/31/2010                                  CLINIC NOTE   The patient is a 21 year old Caucasian female gravida 2, para 1-0-1-62,  baby 64-month-old who since beginning of this pregnancy has had multiple  problems, dizziness, draining from her ears.  We have referred her to a  good ENT and she has an appointment this week finally.  She had problems  with tachycardia.  She has seen a cardiologist and has a mild tricuspid  regurg, but this week she started 2 days ago with bloody diarrhea,  abdominal, back cramping, and nausea.  She has not had any nausea since  beginning her pregnancy and she is now [redacted] weeks along.  She had a  history where she had a colonoscopy and was admitted to the hospital for  3 days back in 2008 with what she was told was that she had ulcers on  her bowel.  She said they did not clot, ulcerative colitis, and she  seemed to have recovered from that.  It was after she had graduated and  she has not had any symptoms similar up until now.  Because I do not  know what the etiology of this is, I am little afraid to put her on  Lomotil.  I told her she could take a little Imodium and probably safe  to take until she sees one of __________ trying to get her an  appointment with a gastroenterologist a.s.a.p., and I am going to give  her little Percocet for her pain.   IMPRESSION:  Also, bloody diarrhea of unknown etiology.  On examination,  the fetal heart was fine on the 12-week 3-day pregnancy.  She is having  her first pregnancy ultrasound this coming Friday.           ______________________________  Argentina Donovan, MD     PR/MEDQ  D:  07/31/2010  T:  08/01/2010  Job:  [id number]

## 2011-05-15 NOTE — Assessment & Plan Note (Signed)
NAME:  Kim [name], Kim [name]               ACCOUNT NO.:  [id number]   MEDICAL RECORD NO.:  [id number]          PATIENT TYPE:  POB   LOCATION:  [facility] at [facility]         FACILITY:  [facility]   PHYSICIAN:  Argentina Donovan, MD        DATE OF BIRTH:  [birth year]   DATE OF SERVICE:  06/08/2010                                  CLINIC NOTE   The patient is a 21 year old Caucasian female gravida 2, para 1-0-1-1  with the baby just over a year old.  On May 29, 2010, the patient  experienced difficulty breathing, some chest pain, called the rescue  service that took her into [facility] where she was reported to  have a pulse rate of 177 and a blood pressure of 150/90.  She also  apparently had some abdominal pain as they were preparing her for a CAT  scan, they gave her the contrast medium to drink and when they just got  her in to do the CAT scan, radiologist came and said we cannot do it.  She is pregnant.  So shortly after that, the heart rate went back down  to fairly normal.  She had received no treatment and was discharged. The  patient at that point not missed a period.  Her previous last period was  on May 06, 2010.  She used NuvaRing plus condoms for contraception and so  apparently conceived during that time if truly she is pregnant.  Chronic  medical conditions, she had been on Flexeril regularly for fibromyalgia  which mainly affects her upper back and neck area.  Physical exam was  except to the report, the next reported comments fairly normal with a  blood pressure 114/74, however, her pulse rate was 120.  Her thyroid was  enlarged, although the TSH, free T3, and T4 were normal as she had the  prenatal testing last week.  The thyroid is symmetrical and  symmetrically enlarged.  Heart, no murmur, normal sinus rhythm with a  apical rate of 120.  The abdomen is soft, flat, nontender.  No masses,  no organomegaly.  The genitalia examination, external genitalia is  normal.  BUS within normal  limits.  Vagina is clean and well rugated.  Cervix is clean, parous.  Pap smear was taken.  The uterus appears to be  of normal size, shape, consistency in first-degree retroversion.  I am  going to have the patient come back in about 2 weeks for followup.  I am  going to get a Cardiology consult on her and I am going to have her get  quantitative beta which we do not have on this lady.  If I did this  examination not knowing that she for sure she was pregnant, I would have  to say that physical examination, she shows no significant signs of  pregnancy at this point.  With her previous pregnancy, she did have  first trimester nausea, she may have at this time, but it is possible  that it is just too early to tell.  In the fact that she got pregnant  using NuvaRing plus condoms, makes a possibility of her not being  pregnant if  possible reality, although at this point she has missed a  period which should have occurred several days ago.   IMPRESSION:  Possible early pregnancy, a tachycardia.           ______________________________  Argentina Donovan, MD     PR/MEDQ  D:  06/08/2010  T:  06/08/2010  Job:  [id number]

## 2011-05-15 NOTE — Discharge Summary (Signed)
Kim [name], Kim [name]               ACCOUNT NO.:  [id number]   MEDICAL RECORD NO.:  [id number]         PATIENT TYPE:  WINP   LOCATION:                                FACILITY:  [facility]   PHYSICIAN:  Lazaro Arms, M.D.   DATE OF BIRTH:  [birth year]   DATE OF ADMISSION:  01/11/2009  DATE OF DISCHARGE:                               DISCHARGE SUMMARY   PRIMARY OB/GYN:  [facility].   DISCHARGE DIAGNOSES:  1. Intrauterine pregnancy at term.  2. Class B gestational diabetes mellitus.  3. Fetal intolerance to labor.   DISCHARGE MEDICATIONS:  1. Zoloft 50 mg daily.  2. Albuterol inhaler p.r.n.  3. Ambien 5 mg 1 p.o. nightly p.r.n. sleep.  4. NuvaRing as directed to start, 2 weeks postpartum.  5. Percocet 5/325 mg 1 p.o. q.4 h. p.r.n. pain.  6. Ibuprofen 600 mg q.6 h. p.r.n. pain.  7. Colace 100 mg b.i.d.  8. FeSO4 325 mg 1 p.o. b.i.d.   DISCONTINUED MEDICATIONS:  Glyburide 2.5 mg.   PROCEDURES:  Primary low transverse C-section.   PERTINENT LABS:  Hemoglobin on discharge 8.6.  Blood type O positive,  rubella immune, and GBS negative.   BRIEF HOSPITAL COURSE:  This is a 21 year old, G2, P 0-0-1-0 at 39  weeks, who was admitted for induction of labor.  The patient is a class  B diabetic, controlled with glyburide.  The patient attempted induction  of labor with Cytotec x1 and Foley bulb placement.  Once the patient  reached 5 cm, artificial rupture of membranes was performed and IUPC was  placed with amnioinfusion started.  The patient then had a prolonged  decels at the 80s with no continual  cervical change and the patient was  taken for C-section.  C-section was well tolerated without any  complications with an estimated blood loss of 700 mL.  Please see  dictated operative note for full details.   The patient had an uncomplicated postpartum recovery.  The patient will  have her staples removed on the post operative day #3 prior to  discharge.  She desires NuvaRing for  contraception.  She is not breast-  feeding.  She will  room in tonight as the NICU continued to monitor baby for feeding and  will go home with her infant tomorrow morning.  Follow up appointment in  6 weeks.  Postpartum appointment at [facility].  Discharged home in  stable condition.      Delbert Harness, MD      Lazaro Arms, M.D.  Electronically Signed    KB/MEDQ  D:  01/15/2009  T:  01/15/2009  Job:  [id number]

## 2011-05-15 NOTE — Assessment & Plan Note (Signed)
NAME:  Kim [name], Kim [name]               ACCOUNT NO.:  [id number]   MEDICAL RECORD NO.:  [id number]          PATIENT TYPE:  POB   LOCATION:  [facility] at [facility]         FACILITY:  [facility]   PHYSICIAN:  Remonia Richter, NP   DATE OF BIRTH:  09-04-1990   DATE OF SERVICE:  03/01/2009                                  CLINIC NOTE   The patient comes to the office today for her postpartum visit.  The  patient was delivered a baby boy on January 12, 2009.  She did have a C-  section secondary to fetal distress as well as failure to progress.  Her  incision is healing with no difficulties.  She has resumed sexual  intercourse approximately 2 weeks ago.  She has been using condoms a  100% of the time.  She was given a NuvaRing in the hospital prior to  discharge, but she has not used it yet, as she has had irregular  bleeding and has not been able to decide when her period started and  stopped.  She is questioning other types of birth control including the  Mirena IUD.  She is doing well with her diet-controlled type 1 diabetes.  Her sugars at home has been in the 86-120 range.  She is not having any  difficulties there.  The patient is also complaining that she is out of  her pain medication for her migraines.  She has been taking oxycodone.  She is willing to trial something else now, but she is no longer  pregnant.  She is not breast feeding.  She is bottle feeding  exclusively.  She has taken Imitrex in the past, which she seemed to  have some reaction to, but is willing to trial something else.   PHYSICAL EXAMINATION:  VITAL SIGNS:  Blood pressure is 98/73, pulse is  100, weight is 115, and height is 5 feet 8 inches.  PELVIC:  External genitalia, there are no lesions or discharges.  Internally, vaginal vault is pink and rugated.  Cervix is closed with no  lesions.  Bimanual exam, there is no cervical motion tenderness.  There  is no adnexal mass.  The incision is well healed.   ASSESSMENT AND  PLAN:  1. Postpartum.  2. Contraception.  3. Migraine headaches.  We had a lengthy discussion concerning the      NuvaRing versus the Mirena.  We were able to insert a NuvaRing      today.  The patient was instructed to leave it in for 3 weeks and 3      days, to take it out and have a 4-day period, and then reinsert it.      She was given prescription for that today.  She is aware that she      needs to use condoms 100% of the time for the next 1 month or she      will may become pregnant.  She was given a prescription for Relpax      40 mg 1 p.o. p.r.n. migraine.  We did discuss how to take this.      She was instructed that if this does not work well  for her, she      should return back to return appointment for consultation for      migraine headaches.  The patient will return to the clinic in July      2010 for her yearly exam or sooner if she is having problems with      her contraception or her migraines.      Remonia Richter, NP     LR/MEDQ  D:  03/01/2009  T:  03/02/2009  Job:  [id number]

## 2011-05-15 NOTE — Op Note (Signed)
NAMEROIZA, WIEDEL               ACCOUNT NO.:  [id number]   MEDICAL RECORD NO.:  [id number]          PATIENT TYPE:  INP   LOCATION:                                FACILITY:  [facility]   PHYSICIAN:  Tanya S. Shawnie Pons, M.D.   DATE OF BIRTH:  24-Jan-1990   DATE OF PROCEDURE:  01/12/2009  DATE OF DISCHARGE:  01/15/2009                               OPERATIVE REPORT   PREOPERATIVE DIAGNOSES:  1. Intrauterine pregnancy at term.  2. Class C pregestational diabetes mellitus.  3. Fetal intolerance to labor.   POSTOPERATIVE DIAGNOSES:  1. Intrauterine pregnancy at term.  2. Class C pregestational diabetes mellitus.  3. Fetal intolerance to labor.   PROCEDURE:  Primary low transverse cesarean section.   SURGEON:  Shelbie Proctor. Shawnie Pons, MD   ASSISTANT:  Odie Sera, DO   ANESTHESIA:  Epidural and local.   INDICATIONS FOR PROCEDURE:  Ms. Brynnlie Unterreiner is a 21 year old gravida  2, now para 1-0-1-1 who was admitted for induction of labor at 84 and  0/7th weeks due to pregestational diabetes as well as gestational  hypertension.  Her labor progressed; however, she had a protracted first  age of labor as well as her fetus showing intolerance to labor with  repetitive variable decelerations.  Her cervix dilated to 5 cm and then  stayed there for several hours.  Pitocin was attempted; however at 2  milliunits per minute, the baby had decel to the 80s, that lasted  approximately 7-8 minutes.  The heart rate recovered after that showed  good variability at a rate of 160; however, due to intolerance to labor,  remote from delivery, the decision was made to proceed with a cesarean  section.  The patient was counseled on the risks and benefits of  cesarean section to include bleeding, infection, damage to internal  organs, potentially requiring further surgery, and possibly  hysterectomy.  The patient voiced understanding of these risks and  agreed to proceed with cesarean section.   DESCRIPTION OF  PROCEDURE:  The patient was taken to the operating room  where her epidural anesthesia was re-dosed.  The patient was then  prepped and draped in usual sterile manner.  A time-out was conducted.  Appropriate anesthesia was confirmed.  A Pfannenstiel incision was made  in the skin with the scalpel.  The incision was extended through down to  the fascia.  The fascia was then incised in the midline with a scalpel.  The fascial incision was extended laterally with Mayo scissors.  The  fascia was then dissected off the underlying rectus muscles.  The rectus  muscles were separated in the midline with manual traction.  The  peritoneum was entered bluntly with the finger and the peritoneal  opening was extended with manual traction.  The bladder blade was  placed.  A transverse incision was made in the lower uterine segment.  The incision was carefully carried down through the myometrial layers  until membranes were noted.  The incision was extended laterally using  manual traction.  Clear amniotic fluid was noted.  The fetal head was  grasped and elevated out of the pelvis.  The bladder blade was then  removed and the head was delivered with the assistance of fundal  pressure.  The mouth and nares were then bulb suctioned and the  shoulders followed by rest of the corpus were delivered without  difficulty.  Mouth and nares were bulb suctioned again.  The cord was  clamped and cut x2, and the baby was handed to the awaiting NICU staff.  The placenta then delivered spontaneously intact with three-vessel cord.  A cord pH was collected, which was 7.09.  The uterine incision was then  closed with 0-Vicryl in a running interlocking fashion for the first  layer, followed by second layer closure of 0-Vicryl in a non-  interlocking fashion.  Good hemostasis was noted.  Both tubes and  ovaries were identified and appeared grossly normal.  The fascia was  then closed with 0-Vicryl in a running  non-interlocking fashion.  Good  hemostasis was noted.  There were no defects noted in the fascia.  The  subcutaneous tissues were irrigated with a wet lap sponge.  Good  hemostasis was noted and then the skin was closed with staples in the  usual manner.  The superior-inferior aspects of the incision were  injected with 0.5% Marcaine 10 mL above, 10 mL below and 5 mL were  injected at each anterior superior iliac spine. Pressure dressing was  then applied.   FINDINGS:  1. Clear amniotic fluid.  2. Viable female infant  3. Bilateral fallopian tubes and ovaries grossly normal.   SPECIMENS:  Placenta, cord pH.   DISPOSITION:  To Pathology.   FINDINGS:  Cord pH of 7.09.   ESTIMATED BLOOD LOSS:  700 mL.   COMPLICATIONS:  None immediate.   The patient was taken to PACU in good condition.      Odie Sera, DO  Electronically Signed     ______________________________  Shelbie Proctor. Shawnie Pons, M.D.    MC/MEDQ  D:  01/12/2009  T:  01/12/2009  Job:  [id number]

## 2011-09-28 LAB — URINALYSIS, ROUTINE W REFLEX MICROSCOPIC
Glucose, UA: NEGATIVE
Leukocytes, UA: NEGATIVE
Protein, ur: NEGATIVE
Specific Gravity, Urine: 1.015
pH: 6

## 2011-09-28 LAB — URINE MICROSCOPIC-ADD ON

## 2011-09-28 LAB — GLUCOSE, CAPILLARY: Glucose-Capillary: 100 — ABNORMAL HIGH

## 2011-10-05 LAB — CBC
Hemoglobin: 11.4 g/dL — ABNORMAL LOW (ref 12.0–15.0)
MCHC: 33.7 g/dL (ref 30.0–36.0)
MCV: 86.8 fL (ref 78.0–100.0)
RBC: 3.91 MIL/uL (ref 3.87–5.11)
RDW: 13.6 % (ref 11.5–15.5)

## 2011-10-05 LAB — COMPREHENSIVE METABOLIC PANEL
ALT: 23 U/L (ref 0–35)
CO2: 25 mEq/L (ref 19–32)
Calcium: 8.3 mg/dL — ABNORMAL LOW (ref 8.4–10.5)
Creatinine, Ser: 0.57 mg/dL (ref 0.4–1.2)
GFR calc Af Amer: >60 mL/min (ref >60)
GFR calc non Af Amer: >60 mL/min (ref >60)
Glucose, Bld: 85 mg/dL (ref 70–99)
Sodium: 137 mEq/L (ref 135–145)
Total Protein: 5.6 g/dL — ABNORMAL LOW (ref 6.0–8.3)

## 2011-10-05 LAB — URINALYSIS, ROUTINE W REFLEX MICROSCOPIC
Bilirubin Urine: NEGATIVE
Bilirubin Urine: NEGATIVE
Hgb urine dipstick: NEGATIVE
Ketones, ur: NEGATIVE mg/dL
Ketones, ur: NEGATIVE mg/dL
Nitrite: NEGATIVE
Nitrite: NEGATIVE
Protein, ur: NEGATIVE mg/dL
Urobilinogen, UA: 0.2 mg/dL (ref 0.0–1.0)
Urobilinogen, UA: 0.2 mg/dL (ref 0.0–1.0)

## 2012-02-13 ENCOUNTER — Emergency Department: Payer: Self-pay | Admitting: Emergency Medicine

## 2012-08-25 ENCOUNTER — Emergency Department: Payer: Self-pay | Admitting: Emergency Medicine

## 2012-10-01 ENCOUNTER — Encounter: Payer: Self-pay | Admitting: Cardiovascular Disease

## 2012-11-10 ENCOUNTER — Emergency Department: Payer: Self-pay | Admitting: Emergency Medicine

## 2012-11-10 LAB — URINALYSIS, COMPLETE
Bilirubin,UR: NEGATIVE
Glucose,UR: NEGATIVE mg/dL (ref 0–75)
Ph: 6 (ref 4.5–8.0)
RBC,UR: 1 /HPF (ref 0–5)
Specific Gravity: 1.009 (ref 1.003–1.030)
Squamous Epithelial: 12
WBC UR: 3 /HPF (ref 0–5)

## 2012-11-10 LAB — CBC
HGB: 13.6 g/dL (ref 12.0–16.0)
MCV: 91 fL (ref 80–100)
Platelet: 251 10*3/uL (ref 150–440)
RBC: 4.44 10*6/uL (ref 3.80–5.20)
RDW: 12.7 % (ref 11.5–14.5)
WBC: 9 10*3/uL (ref 3.6–11.0)

## 2012-11-10 LAB — COMPREHENSIVE METABOLIC PANEL
Alkaline Phosphatase: 73 U/L (ref 50–136)
Calcium, Total: 9 mg/dL (ref 8.5–10.1)
Co2: 27 mmol/L (ref 21–32)
Creatinine: 0.62 mg/dL (ref 0.60–1.30)
EGFR (African American): >60
Glucose: 76 mg/dL (ref 65–99)
Osmolality: 276 (ref 275–301)
Potassium: 3.4 mmol/L — ABNORMAL LOW (ref 3.5–5.1)
SGOT(AST): 19 U/L (ref 15–37)
SGPT (ALT): 16 U/L (ref 12–78)

## 2012-11-10 LAB — LIPASE, BLOOD: Lipase: 214 U/L (ref 73–393)

## 2012-12-30 ENCOUNTER — Emergency Department: Payer: Self-pay | Admitting: Emergency Medicine

## 2012-12-30 LAB — BASIC METABOLIC PANEL
Chloride: 103 mmol/L (ref 98–107)
Co2: 29 mmol/L (ref 21–32)
Creatinine: 0.76 mg/dL (ref 0.60–1.30)
EGFR (African American): >60
EGFR (Non-African Amer.): >60
Glucose: 95 mg/dL (ref 65–99)
Sodium: 138 mmol/L (ref 136–145)

## 2012-12-30 LAB — CBC
HCT: 39 % (ref 35.0–47.0)
HGB: 13 g/dL (ref 12.0–16.0)
Platelet: 213 10*3/uL (ref 150–440)
RDW: 12.4 % (ref 11.5–14.5)
WBC: 18.7 10*3/uL — ABNORMAL HIGH (ref 3.6–11.0)

## 2012-12-30 LAB — MONONUCLEOSIS SCREEN: Mono Test: NEGATIVE

## 2012-12-30 LAB — RAPID INFLUENZA A&B ANTIGENS

## 2013-01-01 LAB — BETA STREP CULTURE(ARMC)

## 2013-02-08 ENCOUNTER — Emergency Department: Payer: Self-pay | Admitting: Emergency Medicine

## 2013-04-27 ENCOUNTER — Emergency Department: Payer: Self-pay | Admitting: Emergency Medicine

## 2013-04-27 LAB — URINALYSIS, COMPLETE
Bilirubin,UR: NEGATIVE
Blood: NEGATIVE
Glucose,UR: NEGATIVE mg/dL (ref 0–75)
Ketone: NEGATIVE
Nitrite: NEGATIVE
Ph: 6 (ref 4.5–8.0)
RBC,UR: 4 /HPF (ref 0–5)
Specific Gravity: 1.015 (ref 1.003–1.030)
Squamous Epithelial: 3
WBC UR: 52 /HPF (ref 0–5)

## 2013-05-06 ENCOUNTER — Emergency Department: Payer: Self-pay | Admitting: Unknown Physician Specialty

## 2013-05-06 LAB — URINALYSIS, COMPLETE
Bacteria: NONE SEEN
Blood: NEGATIVE
Glucose,UR: NEGATIVE mg/dL (ref 0–75)
Ph: 8 (ref 4.5–8.0)
RBC,UR: NONE SEEN /HPF (ref 0–5)
Squamous Epithelial: 2

## 2013-05-25 ENCOUNTER — Emergency Department: Payer: Self-pay | Admitting: Emergency Medicine

## 2013-06-11 ENCOUNTER — Emergency Department: Payer: Self-pay | Admitting: Emergency Medicine

## 2013-06-11 LAB — DRUG SCREEN, URINE
Amphetamines, Ur Screen: NEGATIVE (ref ?–1000)
Barbiturates, Ur Screen: NEGATIVE (ref ?–200)
Benzodiazepine, Ur Scrn: NEGATIVE (ref ?–200)
Cannabinoid 50 Ng, Ur ~~LOC~~: POSITIVE (ref ?–50)
Cocaine Metabolite,Ur ~~LOC~~: POSITIVE (ref ?–300)
Methadone, Ur Screen: NEGATIVE (ref ?–300)
Phencyclidine (PCP) Ur S: NEGATIVE (ref ?–25)
Tricyclic, Ur Screen: NEGATIVE (ref ?–1000)

## 2013-06-11 LAB — CBC
HCT: 39.7 % (ref 35.0–47.0)
HGB: 13.4 g/dL (ref 12.0–16.0)
MCH: 30.1 pg (ref 26.0–34.0)
MCHC: 33.7 g/dL (ref 32.0–36.0)
MCV: 89 fL (ref 80–100)
Platelet: 227 10*3/uL (ref 150–440)
RBC: 4.46 10*6/uL (ref 3.80–5.20)
RDW: 12.9 % (ref 11.5–14.5)

## 2013-06-11 LAB — COMPREHENSIVE METABOLIC PANEL
Alkaline Phosphatase: 75 U/L (ref 50–136)
Bilirubin,Total: 0.4 mg/dL (ref 0.2–1.0)
Co2: 30 mmol/L (ref 21–32)
EGFR (African American): >60
Osmolality: 276 (ref 275–301)
Potassium: 3.8 mmol/L (ref 3.5–5.1)
SGOT(AST): 19 U/L (ref 15–37)
Sodium: 138 mmol/L (ref 136–145)

## 2013-06-11 LAB — URINALYSIS, COMPLETE
Bilirubin,UR: NEGATIVE
Blood: NEGATIVE
Nitrite: NEGATIVE
Specific Gravity: 1.017 (ref 1.003–1.030)
WBC UR: 12 /HPF (ref 0–5)

## 2013-06-11 LAB — ETHANOL
Ethanol %: <0.003 % (ref 0.000–0.080)
Ethanol: <3 mg/dL

## 2013-10-14 ENCOUNTER — Emergency Department: Payer: Self-pay | Admitting: Emergency Medicine

## 2013-10-22 ENCOUNTER — Emergency Department: Payer: Self-pay | Admitting: Internal Medicine

## 2013-12-31 DIAGNOSIS — R569 Unspecified convulsions: Secondary | ICD-10-CM

## 2013-12-31 HISTORY — DX: Unspecified convulsions: R56.9

## 2014-03-09 ENCOUNTER — Emergency Department: Payer: Self-pay | Admitting: Emergency Medicine

## 2014-03-09 LAB — CBC
HCT: 37.9 % (ref 35.0–47.0)
HGB: 13 g/dL (ref 12.0–16.0)
MCH: 31.4 pg (ref 26.0–34.0)
MCHC: 34.3 g/dL (ref 32.0–36.0)
MCV: 92 fL (ref 80–100)
Platelet: 201 10*3/uL (ref 150–440)
RBC: 4.14 10*6/uL (ref 3.80–5.20)
RDW: 13.1 % (ref 11.5–14.5)
WBC: 7.2 10*3/uL (ref 3.6–11.0)

## 2014-03-09 LAB — WET PREP, GENITAL

## 2014-03-09 LAB — GC/CHLAMYDIA PROBE AMP

## 2014-03-09 LAB — URINALYSIS, COMPLETE
BLOOD: NEGATIVE
Bilirubin,UR: NEGATIVE
Glucose,UR: NEGATIVE mg/dL (ref 0–75)
Ketone: NEGATIVE
Leukocyte Esterase: NEGATIVE
Nitrite: NEGATIVE
PROTEIN: NEGATIVE
Ph: 6 (ref 4.5–8.0)
RBC,UR: 1 /HPF (ref 0–5)
Specific Gravity: 1.004 (ref 1.003–1.030)

## 2014-03-09 LAB — HCG, QUANTITATIVE, PREGNANCY: Beta Hcg, Quant.: <1 m[IU]/mL — ABNORMAL LOW

## 2014-04-04 ENCOUNTER — Emergency Department (HOSPITAL_COMMUNITY): Payer: Medicaid Other

## 2014-04-04 ENCOUNTER — Emergency Department (HOSPITAL_COMMUNITY)
Admission: EM | Admit: 2014-04-04 | Discharge: 2014-04-04 | Disposition: A | Discharge status: Home / Self Care | Payer: Medicaid Other | Attending: Emergency Medicine | Admitting: Emergency Medicine

## 2014-04-04 ENCOUNTER — Encounter (HOSPITAL_COMMUNITY): Payer: Self-pay | Admitting: Emergency Medicine

## 2014-04-04 DIAGNOSIS — N390 Urinary tract infection, site not specified: Secondary | ICD-10-CM | POA: Insufficient documentation

## 2014-04-04 DIAGNOSIS — Z8659 Personal history of other mental and behavioral disorders: Secondary | ICD-10-CM | POA: Insufficient documentation

## 2014-04-04 DIAGNOSIS — Z79899 Other long term (current) drug therapy: Secondary | ICD-10-CM | POA: Insufficient documentation

## 2014-04-04 DIAGNOSIS — R404 Transient alteration of awareness: Secondary | ICD-10-CM | POA: Insufficient documentation

## 2014-04-04 DIAGNOSIS — Z8739 Personal history of other diseases of the musculoskeletal system and connective tissue: Secondary | ICD-10-CM | POA: Insufficient documentation

## 2014-04-04 DIAGNOSIS — F17200 Nicotine dependence, unspecified, uncomplicated: Secondary | ICD-10-CM | POA: Insufficient documentation

## 2014-04-04 DIAGNOSIS — R4020 Unspecified coma: Secondary | ICD-10-CM

## 2014-04-04 DIAGNOSIS — Z872 Personal history of diseases of the skin and subcutaneous tissue: Secondary | ICD-10-CM | POA: Insufficient documentation

## 2014-04-04 DIAGNOSIS — Z8719 Personal history of other diseases of the digestive system: Secondary | ICD-10-CM | POA: Insufficient documentation

## 2014-04-04 DIAGNOSIS — Z8632 Personal history of gestational diabetes: Secondary | ICD-10-CM | POA: Insufficient documentation

## 2014-04-04 DIAGNOSIS — Z3202 Encounter for pregnancy test, result negative: Secondary | ICD-10-CM | POA: Insufficient documentation

## 2014-04-04 DIAGNOSIS — D649 Anemia, unspecified: Secondary | ICD-10-CM | POA: Insufficient documentation

## 2014-04-04 LAB — CBC WITH DIFFERENTIAL/PLATELET
Basophils Absolute: 0 10*3/uL (ref 0.0–0.1)
Basophils Relative: 0 % (ref 0–1)
EOS PCT: 1 % (ref 0–5)
Eosinophils Absolute: 0.1 10*3/uL (ref 0.0–0.7)
HCT: 36.2 % (ref 36.0–46.0)
Hemoglobin: 12.3 g/dL (ref 12.0–15.0)
LYMPHS ABS: 1.8 10*3/uL (ref 0.7–4.0)
Lymphocytes Relative: 15 % (ref 12–46)
MCH: 30.5 pg (ref 26.0–34.0)
MCHC: 34 g/dL (ref 30.0–36.0)
MCV: 89.8 fL (ref 78.0–100.0)
Monocytes Absolute: 0.8 10*3/uL (ref 0.1–1.0)
Monocytes Relative: 7 % (ref 3–12)
Neutro Abs: 9.6 10*3/uL — ABNORMAL HIGH (ref 1.7–7.7)
Neutrophils Relative %: 77 % (ref 43–77)
PLATELETS: 222 10*3/uL (ref 150–400)
RBC: 4.03 MIL/uL (ref 3.87–5.11)
RDW: 12.6 % (ref 11.5–15.5)
WBC: 12.3 10*3/uL — ABNORMAL HIGH (ref 4.0–10.5)

## 2014-04-04 LAB — COMPREHENSIVE METABOLIC PANEL
ALT: 8 U/L (ref 0–35)
AST: 14 U/L (ref 0–37)
Albumin: 3.4 g/dL — ABNORMAL LOW (ref 3.5–5.2)
Alkaline Phosphatase: 61 U/L (ref 39–117)
BUN: 8 mg/dL (ref 6–23)
CALCIUM: 8.8 mg/dL (ref 8.4–10.5)
CO2: 26 meq/L (ref 19–32)
Chloride: 102 mEq/L (ref 96–112)
Creatinine, Ser: 0.7 mg/dL (ref 0.50–1.10)
GLUCOSE: 93 mg/dL (ref 70–99)
Potassium: 3.5 mEq/L — ABNORMAL LOW (ref 3.7–5.3)
Sodium: 140 mEq/L (ref 137–147)
TOTAL PROTEIN: 6.4 g/dL (ref 6.0–8.3)
Total Bilirubin: <0.2 mg/dL — ABNORMAL LOW (ref 0.3–1.2)

## 2014-04-04 LAB — URINALYSIS, ROUTINE W REFLEX MICROSCOPIC
Bilirubin Urine: NEGATIVE
GLUCOSE, UA: NEGATIVE mg/dL
Hgb urine dipstick: NEGATIVE
KETONES UR: NEGATIVE mg/dL
NITRITE: POSITIVE — AB
PH: 7 (ref 5.0–8.0)
Protein, ur: NEGATIVE mg/dL
SPECIFIC GRAVITY, URINE: 1.015 (ref 1.005–1.030)
Urobilinogen, UA: 0.2 mg/dL (ref 0.0–1.0)

## 2014-04-04 LAB — CBG MONITORING, ED: GLUCOSE-CAPILLARY: 97 mg/dL (ref 70–99)

## 2014-04-04 LAB — URINE MICROSCOPIC-ADD ON

## 2014-04-04 LAB — ETHANOL

## 2014-04-04 LAB — RAPID URINE DRUG SCREEN, HOSP PERFORMED
AMPHETAMINES: NOT DETECTED
Barbiturates: NOT DETECTED
Benzodiazepines: NOT DETECTED
Cocaine: NOT DETECTED
OPIATES: NOT DETECTED
Tetrahydrocannabinol: POSITIVE — AB

## 2014-04-04 LAB — POC URINE PREG, ED: Preg Test, Ur: NEGATIVE

## 2014-04-04 MED ORDER — SULFAMETHOXAZOLE-TRIMETHOPRIM 800-160 MG PO TABS: 1.0000 | ORAL_TABLET | Freq: Two times a day (BID) | ORAL | Status: DC

## 2014-04-04 MED ORDER — DEXTROSE 5 % IV SOLN: 1.0000 g | Freq: Once | INTRAVENOUS | Status: DC

## 2014-04-04 NOTE — ED Notes (Signed)
Pt presents with seizure like activity full body while at church. Witnessed by family for approximately 3-5 minutes per family. Pt has no history of seizures but states that she stopped her seroquel and prozac approximately 2 weeks ago. Pt was posr -dictal per EMS arriva. Pt states that she felt dizzy prior the episode. cbg was 65. Pt states that she did not eat today. Pt states that she had recently suffered a miscarriage which why she had stopped her medication. Pt reports a headache at time of triage

## 2014-04-04 NOTE — Discharge Instructions (Signed)
Urinary Tract Infection Urinary tract infections (UTIs) can develop anywhere along your urinary tract. Your urinary tract is your body's drainage system for removing wastes and extra water. Your urinary tract includes two kidneys, two ureters, a bladder, and a urethra. Your kidneys are a pair of bean-shaped organs. Each kidney is about the size of your fist. They are located below your ribs, one on each side of your spine. CAUSES Infections are caused by microbes, which are microscopic organisms, including fungi, viruses, and bacteria. These organisms are so small that they can only be seen through a microscope. Bacteria are the microbes that most commonly cause UTIs. SYMPTOMS  Symptoms of UTIs may vary by age and gender of the patient and by the location of the infection. Symptoms in young women typically include a frequent and intense urge to urinate and a painful, burning feeling in the bladder or urethra during urination. Older women and men are more likely to be tired, shaky, and weak and have muscle aches and abdominal pain. A fever may mean the infection is in your kidneys. Other symptoms of a kidney infection include pain in your back or sides below the ribs, nausea, and vomiting. DIAGNOSIS To diagnose a UTI, your caregiver will ask you about your symptoms. Your caregiver also will ask to provide a urine sample. The urine sample will be tested for bacteria and white blood cells. White blood cells are made by your body to help fight infection. TREATMENT  Typically, UTIs can be treated with medication. Because most UTIs are caused by a bacterial infection, they usually can be treated with the use of antibiotics. The choice of antibiotic and length of treatment depend on your symptoms and the type of bacteria causing your infection. HOME CARE INSTRUCTIONS  If you were prescribed antibiotics, take them exactly as your caregiver instructs you. Finish the medication even if you feel better after you  have only taken some of the medication.  Drink enough water and fluids to keep your urine clear or pale yellow.  Avoid caffeine, tea, and carbonated beverages. They tend to irritate your bladder.  Empty your bladder often. Avoid holding urine for long periods of time.  Empty your bladder before and after sexual intercourse.  After a bowel movement, women should cleanse from front to back. Use each tissue only once. SEEK MEDICAL CARE IF:   You have back pain.  You develop a fever.  Your symptoms do not begin to resolve within 3 days. SEEK IMMEDIATE MEDICAL CARE IF:   You have severe back pain or lower abdominal pain.  You develop chills.  You have nausea or vomiting.  You have continued burning or discomfort with urination. MAKE SURE YOU:   Understand these instructions.  Will watch your condition.  Will get help right away if you are not doing well or get worse. Document Released: 09/26/2005 Document Revised: 06/17/2012 Document Reviewed: 01/25/2012 [facility] Patient Information 2014 [place], [place]. Seizure, Adult A seizure is abnormal electrical activity in the brain. Seizures usually last from 30 seconds to 2 minutes. There are various types of seizures. Before a seizure, you may have a warning sensation (aura) that a seizure is about to occur. An aura may include the following symptoms:   Fear or anxiety.  Nausea.  Feeling like the room is spinning (vertigo).  Vision changes, such as seeing flashing lights or spots. Common symptoms during a seizure include:  A change in attention or behavior (altered mental status).  Convulsions with rhythmic jerking  movements.  Drooling.  Rapid eye movements.  Grunting.  Loss of bladder and bowel control.  Bitter taste in the mouth.  Tongue biting. After a seizure, you may feel confused and sleepy. You may also have an injury resulting from convulsions during the seizure. HOME CARE INSTRUCTIONS   If you are given  medicines, take them exactly as prescribed by your health care provider.  Keep all follow-up appointments as directed by your health care provider.  Do not swim or drive or engage in risky activity during which a seizure could cause further injury to you or others until your health care provider says it is OK.  Get adequate rest.  Teach friends and family what to do if you have a seizure. They should:  Lay you on the ground to prevent a fall.  Put a cushion under your head.  Loosen any tight clothing around your neck.  Turn you on your side. If vomiting occurs, this helps keep your airway clear.  Stay with you until you recover.  Know whether or not you need emergency care. SEEK IMMEDIATE MEDICAL CARE IF:  The seizure lasts longer than 5 minutes.  The seizure is severe or you do not wake up immediately after the seizure.  You have an altered mental status after the seizure.  You are having more frequent or worsening seizures. Someone should drive you to the emergency department or call local emergency services (911 in [place] MAKE SURE YOU:  Understand these instructions.  Will watch your condition.  Will get help right away if you are not doing well or get worse. Document Released: 12/14/2000 Document Revised: 10/07/2013 Document Reviewed: 07/29/2013 [facility] Patient Information 2014 [place], [place]. Syncope Syncope is a fainting spell. This means the person loses consciousness and drops to the ground. The person is generally unconscious for less than 5 minutes. The person may have some muscle twitches for up to 15 seconds before waking up and returning to normal. Syncope occurs more often in elderly people, but it can happen to anyone. While most causes of syncope are not dangerous, syncope can be a sign of a serious medical problem. It is important to seek medical care.  CAUSES  Syncope is caused by a sudden decrease in blood flow to the brain. The specific cause is often  not determined. Factors that can trigger syncope include:  Taking medicines that lower blood pressure.  Sudden changes in posture, such as standing up suddenly.  Taking more medicine than prescribed.  Standing in one place for too long.  Seizure disorders.  Dehydration and excessive exposure to heat.  Low blood sugar (hypoglycemia).  Straining to have a bowel movement.  Heart disease, irregular heartbeat, or other circulatory problems.  Fear, emotional distress, seeing blood, or severe pain. SYMPTOMS  Right before fainting, you may:  Feel dizzy or lightheaded.  Feel nauseous.  See all white or all black in your field of vision.  Have cold, clammy skin. DIAGNOSIS  Your caregiver will ask about your symptoms, perform a physical exam, and perform electrocardiography (ECG) to record the electrical activity of your heart. Your caregiver may also perform other heart or blood tests to determine the cause of your syncope. TREATMENT  In most cases, no treatment is needed. Depending on the cause of your syncope, your caregiver may recommend changing or stopping some of your medicines. HOME CARE INSTRUCTIONS  Have someone stay with you until you feel stable.  Do not drive, operate machinery, or play sports until  your caregiver says it is okay.  Keep all follow-up appointments as directed by your caregiver.  Lie down right away if you start feeling like you might faint. Breathe deeply and steadily. Wait until all the symptoms have passed.  Drink enough fluids to keep your urine clear or pale yellow.  If you are taking blood pressure or heart medicine, get up slowly, taking several minutes to sit and then stand. This can reduce dizziness. SEEK IMMEDIATE MEDICAL CARE IF:   You have a severe headache.  You have unusual pain in the chest, abdomen, or back.  You are bleeding from the mouth or rectum, or you have black or tarry stool.  You have an irregular or very fast  heartbeat.  You have pain with breathing.  You have repeated fainting or seizure-like jerking during an episode.  You faint when sitting or lying down.  You have confusion.  You have difficulty walking.  You have severe weakness.  You have vision problems. If you fainted, call your local emergency services (911 in [place] Do not drive yourself to the hospital.  MAKE SURE YOU:  Understand these instructions.  Will watch your condition.  Will get help right away if you are not doing well or get worse. Document Released: 12/17/2005 Document Revised: 06/17/2012 Document Reviewed: 02/15/2012 [facility] Patient Information 2014 [place], [place]. Driving and Equipment Restrictions Some medical problems make it dangerous to drive, ride a bike, or use machines. Some of these problems are:  A hard blow to the head (concussion).  Passing out (fainting).  Twitching and shaking (seizures).  Low blood sugar.  Taking medicine to help you relax (sedatives).  Taking pain medicines.  Wearing an eye patch.  Wearing splints. This can make it hard to use parts of your body that you need to drive safely. HOME CARE   Do not drive until your doctor says it is okay.  Do not use machines until your doctor says it is okay. You may need a form signed by your doctor (medical release) before you can drive again. You may also need this form before you do other tasks where you need to be fully alert. MAKE SURE YOU:  Understand these instructions.  Will watch your condition.  Will get help right away if you are not doing well or get worse. Document Released: 01/24/2005 Document Revised: 03/10/2012 Document Reviewed: 04/26/2010 [facility] Patient Information 2014 [place], [place].
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
## 2014-04-04 NOTE — ED Notes (Signed)
Patient transported to CT 

## 2014-04-07 NOTE — ED Provider Notes (Signed)
Medical screening examination/treatment/procedure(s) were performed by non-physician practitioner and as supervising physician I was immediately available for consultation/collaboration.   EKG Interpretation   Date/Time:  Sunday April 04 2014 12:15:20 EDT Ventricular Rate:  68 PR Interval:  127 QRS Duration: 100 QT Interval:  402 QTC Calculation: 427 R Axis:   90 Text Interpretation:  Sinus rhythm Artifact Consider right ventricular  hypertrophy No old tracing to compare Confirmed by [facility]  MD, CHARLES  [phone]([id number]) on 04/04/2014 12:18:29 PM        Bonnita Levanharles B. Bernette MayersSheldon, MD 04/07/14 [phone]

## 2014-08-01 ENCOUNTER — Emergency Department: Payer: Self-pay | Admitting: Emergency Medicine

## 2014-11-07 ENCOUNTER — Emergency Department (HOSPITAL_COMMUNITY)
Admission: EM | Admit: 2014-11-07 | Discharge: 2014-11-07 | Disposition: A | Discharge status: Home / Self Care | Payer: Medicaid Other | Attending: Emergency Medicine | Admitting: Emergency Medicine

## 2014-11-07 ENCOUNTER — Emergency Department (HOSPITAL_COMMUNITY): Payer: Medicaid Other

## 2014-11-07 ENCOUNTER — Encounter (HOSPITAL_COMMUNITY): Payer: Self-pay | Admitting: *Deleted

## 2014-11-07 DIAGNOSIS — B373 Candidiasis of vulva and vagina: Secondary | ICD-10-CM | POA: Diagnosis not present

## 2014-11-07 DIAGNOSIS — Z872 Personal history of diseases of the skin and subcutaneous tissue: Secondary | ICD-10-CM | POA: Diagnosis not present

## 2014-11-07 DIAGNOSIS — Z8632 Personal history of gestational diabetes: Secondary | ICD-10-CM | POA: Insufficient documentation

## 2014-11-07 DIAGNOSIS — F17200 Nicotine dependence, unspecified, uncomplicated: Secondary | ICD-10-CM | POA: Insufficient documentation

## 2014-11-07 DIAGNOSIS — O99331 Smoking (tobacco) complicating pregnancy, first trimester: Secondary | ICD-10-CM | POA: Diagnosis not present

## 2014-11-07 DIAGNOSIS — Z792 Long term (current) use of antibiotics: Secondary | ICD-10-CM | POA: Diagnosis not present

## 2014-11-07 DIAGNOSIS — Z862 Personal history of diseases of the blood and blood-forming organs and certain disorders involving the immune mechanism: Secondary | ICD-10-CM | POA: Diagnosis not present

## 2014-11-07 DIAGNOSIS — F329 Major depressive disorder, single episode, unspecified: Secondary | ICD-10-CM | POA: Insufficient documentation

## 2014-11-07 DIAGNOSIS — Z8719 Personal history of other diseases of the digestive system: Secondary | ICD-10-CM | POA: Diagnosis not present

## 2014-11-07 DIAGNOSIS — O200 Threatened abortion: Secondary | ICD-10-CM | POA: Diagnosis not present

## 2014-11-07 DIAGNOSIS — O26851 Spotting complicating pregnancy, first trimester: Secondary | ICD-10-CM

## 2014-11-07 DIAGNOSIS — M797 Fibromyalgia: Secondary | ICD-10-CM | POA: Insufficient documentation

## 2014-11-07 DIAGNOSIS — Z3A01 Less than 8 weeks gestation of pregnancy: Secondary | ICD-10-CM | POA: Diagnosis not present

## 2014-11-07 DIAGNOSIS — O209 Hemorrhage in early pregnancy, unspecified: Secondary | ICD-10-CM | POA: Diagnosis present

## 2014-11-07 DIAGNOSIS — O9989 Other specified diseases and conditions complicating pregnancy, childbirth and the puerperium: Secondary | ICD-10-CM | POA: Insufficient documentation

## 2014-11-07 DIAGNOSIS — Z79899 Other long term (current) drug therapy: Secondary | ICD-10-CM | POA: Insufficient documentation

## 2014-11-07 DIAGNOSIS — O99341 Other mental disorders complicating pregnancy, first trimester: Secondary | ICD-10-CM | POA: Insufficient documentation

## 2014-11-07 DIAGNOSIS — Z3490 Encounter for supervision of normal pregnancy, unspecified, unspecified trimester: Secondary | ICD-10-CM

## 2014-11-07 DIAGNOSIS — B3731 Acute candidiasis of vulva and vagina: Secondary | ICD-10-CM

## 2014-11-07 LAB — CBC WITH DIFFERENTIAL/PLATELET
BASOS ABS: 0 10*3/uL (ref 0.0–0.1)
Basophils Relative: 0 % (ref 0–1)
Eosinophils Absolute: 0.1 10*3/uL (ref 0.0–0.7)
Eosinophils Relative: 1 % (ref 0–5)
HCT: 32.1 % — ABNORMAL LOW (ref 36.0–46.0)
Hemoglobin: 10.8 g/dL — ABNORMAL LOW (ref 12.0–15.0)
LYMPHS PCT: 33 % (ref 12–46)
Lymphs Abs: 1.8 10*3/uL (ref 0.7–4.0)
MCH: 29.1 pg (ref 26.0–34.0)
MCHC: 33.6 g/dL (ref 30.0–36.0)
MCV: 86.5 fL (ref 78.0–100.0)
MONO ABS: 0.4 10*3/uL (ref 0.1–1.0)
Monocytes Relative: 8 % (ref 3–12)
NEUTROS ABS: 3 10*3/uL (ref 1.7–7.7)
NEUTROS PCT: 58 % (ref 43–77)
PLATELETS: 229 10*3/uL (ref 150–400)
RBC: 3.71 MIL/uL — ABNORMAL LOW (ref 3.87–5.11)
RDW: 13 % (ref 11.5–15.5)
WBC: 5.3 10*3/uL (ref 4.0–10.5)

## 2014-11-07 LAB — WET PREP, GENITAL
Clue Cells Wet Prep HPF POC: NONE SEEN
Trich, Wet Prep: NONE SEEN

## 2014-11-07 LAB — HCG, QUANTITATIVE, PREGNANCY: hCG, Beta Chain, Quant, S: 1566 m[IU]/mL — ABNORMAL HIGH (ref <5)

## 2014-11-07 LAB — URINALYSIS, ROUTINE W REFLEX MICROSCOPIC
Bilirubin Urine: NEGATIVE
GLUCOSE, UA: NEGATIVE mg/dL
Hgb urine dipstick: NEGATIVE
Ketones, ur: NEGATIVE mg/dL
LEUKOCYTES UA: NEGATIVE
Nitrite: NEGATIVE
PH: 7.5 (ref 5.0–8.0)
PROTEIN: NEGATIVE mg/dL
Specific Gravity, Urine: 1.011 (ref 1.005–1.030)
Urobilinogen, UA: 0.2 mg/dL (ref 0.0–1.0)

## 2014-11-07 LAB — I-STAT CHEM 8, ED
BUN: 8 mg/dL (ref 6–23)
CALCIUM ION: 1.17 mmol/L (ref 1.12–1.23)
Chloride: 105 mEq/L (ref 96–112)
Creatinine, Ser: 0.6 mg/dL (ref 0.50–1.10)
Glucose, Bld: 89 mg/dL (ref 70–99)
HEMATOCRIT: 34 % — AB (ref 36.0–46.0)
Hemoglobin: 11.6 g/dL — ABNORMAL LOW (ref 12.0–15.0)
POTASSIUM: 3.6 meq/L — AB (ref 3.7–5.3)
SODIUM: 138 meq/L (ref 137–147)
TCO2: 22 mmol/L (ref 0–100)

## 2014-11-07 LAB — ABO/RH: ABO/RH(D): O POS

## 2014-11-07 LAB — PREGNANCY, URINE: PREG TEST UR: POSITIVE — AB

## 2014-11-07 MED ORDER — CLOTRIMAZOLE 1 % VA CREA: TOPICAL_CREAM | VAGINAL | Status: DC

## 2014-11-07 MED ORDER — PRENATAL VITAMIN 27-0.8 MG PO TABS: 1.0000 | ORAL_TABLET | Freq: Every morning | ORAL | Status: DC

## 2014-11-07 NOTE — ED Provider Notes (Signed)
CSN: [id number]     Arrival date & time 11/07/14  1114 History   First MD Initiated Contact with Patient 11/07/14 1135     Chief Complaint  Patient presents with  . Vaginal Bleeding     (Consider location/radiation/quality/duration/timing/severity/associated sxs/prior Treatment) HPI   24 year old 575P2 female presents for evaluation of vaginal bleeding.  Pt report her LPM was 10/4.  She has had 3 positive pregnancy test this week.  Pt develop RLQ abd pain, intermittent, mild pain x 3 days.  Pain has progressed and radiates to back.  Pt felt nauseaous, vomit once yesterday, having clear vaginal discharge without odor. She has mild vaginal bleeding x 3 days without clots, changing 1 pad a day. No prior hx of STD or ectopic pregnancy. Has 3 miscarriage in the past. Pt is a smoker. Repot mild cough and rhinorrhea for the past few days but sts she has been remodeling her house and it may have caused it.  Denies fever, chills, sob, wheezing.  Does not have OBGYN.    Past Medical History  Diagnosis Date  . Chest pain   . SOB (shortness of breath)   . Depression   . Gestational diabetes   . Fibromyalgia   . Anemia   . Ulcer   . Rectal bleeding    Past Surgical History  Procedure Laterality Date  . Cesarean section     Family History  Problem Relation Age of Onset  . Depression Mother   . Fibromyalgia Mother    History  Substance Use Topics  . Smoking status: Current Every Day Smoker  . Smokeless tobacco: Not on file  . Alcohol Use: No   OB History    No data available     Review of Systems  All other systems reviewed and are negative.     Allergies  Review of patient's allergies indicates no known allergies.  Home Medications   Prior to Admission medications   Medication Sig Start Date End Date Taking? Authorizing Provider  albuterol (PROVENTIL HFA;VENTOLIN HFA) 108 (90 BASE) MCG/ACT inhaler Inhale 2 puffs into the lungs every 6 (six) hours as needed for wheezing or  shortness of breath.    Historical Provider, MD  FLUoxetine (PROZAC) 10 MG capsule Take 10 mg by mouth daily.    Historical Provider, MD  sulfamethoxazole-trimethoprim (SEPTRA DS) 800-160 MG per tablet Take 1 tablet by mouth 2 (two) times daily. 04/04/14   Abigail Harris, PA-C   BP 120/76 mmHg  Pulse 74  Temp(Src) 98.1 F (36.7 C) (Oral)  Resp 18  SpO2 100%  LMP 10/03/2014 Physical Exam  Constitutional: She appears well-developed and well-nourished. No distress.  HENT:  Head: Atraumatic.  Eyes: Conjunctivae are normal.  Neck: Neck supple.  Cardiovascular: Normal rate and regular rhythm.   Pulmonary/Chest: Effort normal and breath sounds normal.  Abdominal: Soft. There is no tenderness. There is no rebound and no guarding.  Nongravid abdomen  Negative Murphy sign, no pain at McBurney's point.  No CVA tenderness  Genitourinary:  Chaperone present:  No inguinal lymphadenopathy, normal external genitalia, no pain with speculum insertion, normal vaginal vault with minimal discharge and no blood in vaginal vault. Cervical os is visualized and closed. On bimanual exam patient has no adnexal tenderness or cervical motion tenderness.  Neurological: She is alert.  Skin: No rash noted.  Psychiatric: She has a normal mood and affect.  Nursing note and vitals reviewed.   ED Course  Procedures (including critical care time)  Patient here with low abdominal pain and 3 positive pregnancy test within the past week. Workup initiated to rule out ectopic pregnancy or other signs of a patient. I have low suspicion for appendicitis, UTI, or kidney stones.  3:55 PM Wet prep demonstrated some evidence of yeast infection. Will treat with clotrimazole vaginal cream for 1-2 weeks.  CSN is positive, her hCG is 1566 and a transvaginal ultrasound demonstrate a yolk sac intrauterine without any obvious complication. The remainder of the labs are unremarkable. At this time I suggest patient to follow-up  closely with OB/GYN for a repeat beta hCG as well as an official ultrasound. She is to avoid any medication that can be potentially harmful for the baby. Recommend prenatal vitamins. Return precautions discussed. Patient voiced understanding and agrees with plan.  Labs Review Labs Reviewed  WET PREP, GENITAL - Abnormal; Notable for the following:    Yeast Wet Prep HPF POC RARE (*)    WBC, Wet Prep HPF POC RARE (*)    All other components within normal limits  PREGNANCY, URINE - Abnormal; Notable for the following:    Preg Test, Ur POSITIVE (*)    All other components within normal limits  HCG, QUANTITATIVE, PREGNANCY - Abnormal; Notable for the following:    hCG, Beta Chain, Quant, S 1566 (*)    All other components within normal limits  CBC WITH DIFFERENTIAL - Abnormal; Notable for the following:    RBC 3.71 (*)    Hemoglobin 10.8 (*)    HCT 32.1 (*)    All other components within normal limits  I-STAT CHEM 8, ED - Abnormal; Notable for the following:    Potassium 3.6 (*)    Hemoglobin 11.6 (*)    HCT 34.0 (*)    All other components within normal limits  GC/CHLAMYDIA PROBE AMP  URINALYSIS, ROUTINE W REFLEX MICROSCOPIC  HIV ANTIBODY (ROUTINE TESTING)  RPR  ABO/RH    Imaging Review [place] Ob Comp Less 14 Wks  11/07/2014   CLINICAL DATA:  Pregnant patient, now with vaginal spotting and cramping. Beta HCG - 1,566.  EXAM: OBSTETRIC <14 WK US AND TRANSVAGINAL OB US  TECHNIQUE: Both transabdominal and transvaginal ultrasound examinations were performed for complete evaluation of the gestation as well as the maternal uterus, adnexal regions, and pelvic cul-de-sac. Transvaginal technique was performed to assess early pregnancy.  COMPARISON:  None.  FINDINGS: Intrauterine gestational sac: Visualized, normal in appearance with normal/expected surrounding decidual reaction. No evidence of a subchorionic hemorrhage.  Yolk sac:  Visualized  Embryo:  Not visualized  MSD:  4.7  mm   5 w   2  d                US EDC: 07/08/2015  Maternal uterus/adnexae:  Otherwise, normal appearance of the anteverted uterus.  The left ovary is normal in size and appearance, measuring 3.0 x 1.8 x 2.6 cm. No discrete left-sided ovarian or adnexal lesion.  The right ovary is normal in size and appearance measuring 1.8 x 2.3 x 1.4 cm. No discrete right-sided ovarian or adnexal lesion.  IMPRESSION: A normal intrauterine gestational sac is visualized with mean sac diameter compatible with 5 weeks 2 days gestation and an estimated delivery date of 07/08/2015. Serial beta HCG and follow-up pelvic ultrasound could be performed to ensure visualization of a viable embryo as clinically indicated.   Electronically Signed   By: Simonne ComeJohn  Watts M.D.   On: 11/07/2014 15:29   [place] Ob Transvaginal  11/07/2014  CLINICAL DATA:  Pregnant patient, now with vaginal spotting and cramping. Beta HCG - 1,566.  EXAM: OBSTETRIC <14 WK [place] AND TRANSVAGINAL OB US  TECHNIQUE: Both transabdominal and transvaginal ultrasound examinations were performed for complete evaluation of the gestation as well as the maternal uterus, adnexal regions, and pelvic cul-de-sac. Transvaginal technique was performed to assess early pregnancy.  COMPARISON:  None.  FINDINGS: Intrauterine gestational sac: Visualized, normal in appearance with normal/expected surrounding decidual reaction. No evidence of a subchorionic hemorrhage.  Yolk sac:  Visualized  Embryo:  Not visualized  MSD:  4.7  mm   5 w   2  d               [place] EDC: 07/08/2015  Maternal uterus/adnexae:  Otherwise, normal appearance of the anteverted uterus.  The left ovary is normal in size and appearance, measuring 3.0 x 1.8 x 2.6 cm. No discrete left-sided ovarian or adnexal lesion.  The right ovary is normal in size and appearance measuring 1.8 x 2.3 x 1.4 cm. No discrete right-sided ovarian or adnexal lesion.  IMPRESSION: A normal intrauterine gestational sac is visualized with mean sac diameter compatible with 5 weeks 2  days gestation and an estimated delivery date of 07/08/2015. Serial beta HCG and follow-up pelvic ultrasound could be performed to ensure visualization of a viable embryo as clinically indicated.   Electronically Signed   By: Simonne Come M.D.   On: 11/07/2014 15:29     EKG Interpretation None      MDM   Final diagnoses:  Pregnancy  Threatened abortion in first trimester  Vagina, candidiasis    BP 105/60 mmHg  Pulse 63  Temp(Src) 98.1 F (36.7 C) (Oral)  Resp 18  SpO2 98%  LMP 10/03/2014  I have reviewed nursing notes and vital signs. I personally reviewed the imaging tests through PACS system  I reviewed available ER/hospitalization records thought the EMR     Fayrene Helper, PA-C 11/07/14 1556  Mirian Mo, MD 11/11/14 1906

## 2014-11-07 NOTE — ED Notes (Signed)
Pt returned from US

## 2014-11-07 NOTE — Discharge Instructions (Signed)
You are pregnant, estimated to be 5 weeks and 2 days with expected date of July 8th, 2016.  Please apply clomitrazole cream nightly for 1-2 weeks.  [facility] for recheck HcG and official ultrasound.     Threatened Miscarriage A threatened miscarriage occurs when you have vaginal bleeding during your first 20 weeks of pregnancy but the pregnancy has not ended. If you have vaginal bleeding during this time, your health care provider will do tests to make sure you are still pregnant. If the tests show you are still pregnant and the developing baby (fetus) inside your womb (uterus) is still growing, your condition is considered a threatened miscarriage. A threatened miscarriage does not mean your pregnancy will end, but it does increase the risk of losing your pregnancy (complete miscarriage). CAUSES  The cause of a threatened miscarriage is usually not known. If you go on to have a complete miscarriage, the most common cause is an abnormal number of chromosomes in the developing baby. Chromosomes are the structures inside cells that hold all your genetic material. Some causes of vaginal bleeding that do not result in miscarriage include:  Having sex.  Having an infection.  Normal hormone changes of pregnancy.  Bleeding that occurs when an egg implants in your uterus. RISK FACTORS Risk factors for bleeding in early pregnancy include:  Obesity.  Smoking.  Drinking excessive amounts of alcohol or caffeine.  Recreational drug use. SIGNS AND SYMPTOMS  Light vaginal bleeding.  Mild abdominal pain or cramps. DIAGNOSIS  If you have bleeding with or without abdominal pain before 20 weeks of pregnancy, your health care provider will do tests to check whether you are still pregnant. One important test involves using sound waves and a computer (ultrasound) to create images of the inside of your uterus. Other tests include an internal exam of your vagina and  uterus (pelvic exam) and measurement of your baby's heart rate.  You may be diagnosed with a threatened miscarriage if:  Ultrasound testing shows you are still pregnant.  Your baby's heart rate is strong.  A pelvic exam shows that the opening between your uterus and your vagina (cervix) is closed.  Your heart rate and blood pressure are stable.  Blood tests confirm you are still pregnant. TREATMENT  No treatments have been shown to prevent a threatened miscarriage from going on to a complete miscarriage. However, the right home care is important.  HOME CARE INSTRUCTIONS   Make sure you keep all your appointments for prenatal care. This is very important.  Get plenty of rest.  Do not have sex or use tampons if you have vaginal bleeding.  Do not douche.  Do not smoke or use recreational drugs.  Do not drink alcohol.  Avoid caffeine. SEEK MEDICAL CARE IF:  You have light vaginal bleeding or spotting while pregnant.  You have abdominal pain or cramping.  You have a fever. SEEK IMMEDIATE MEDICAL CARE IF:  You have heavy vaginal bleeding.  You have blood clots coming from your vagina.  You have severe low back pain or abdominal cramps.  You have fever, chills, and severe abdominal pain. MAKE SURE YOU:  Understand these instructions.  Will watch your condition.  Will get help right away if you are not doing well or get worse. Document Released: 12/17/2005 Document Revised: 12/22/2013 Document Reviewed: 10/13/2013 [facility] Patient Information 2015 [place], [place]. This information is not intended to replace advice given to you by your health care provider.  Make sure you discuss any questions you have with your health care provider. ° °

## 2014-11-07 NOTE — ED Notes (Signed)
Pt reports lmp 10/4, had 3 + preg tests at home. Started having spotting and cramping 2 days ago.

## 2014-11-07 NOTE — ED Notes (Signed)
Pt returned to floor from ultrasound.

## 2014-11-08 LAB — GC/CHLAMYDIA PROBE AMP
CT PROBE, AMP APTIMA: NEGATIVE
GC Probe RNA: NEGATIVE

## 2014-11-08 LAB — HIV ANTIBODY (ROUTINE TESTING W REFLEX): HIV 1&2 Ab, 4th Generation: NONREACTIVE

## 2014-11-08 LAB — RPR

## 2014-11-16 ENCOUNTER — Encounter: Payer: Self-pay | Admitting: Family Medicine

## 2014-11-16 ENCOUNTER — Ambulatory Visit (INDEPENDENT_AMBULATORY_CARE_PROVIDER_SITE_OTHER): Payer: Medicaid Other | Admitting: Family Medicine

## 2014-11-16 VITALS — BP 106/63 | HR 69 | Ht 68.0 in | Wt 107.0 lb

## 2014-11-16 DIAGNOSIS — Z23 Encounter for immunization: Secondary | ICD-10-CM

## 2014-11-16 DIAGNOSIS — Z3491 Encounter for supervision of normal pregnancy, unspecified, first trimester: Secondary | ICD-10-CM

## 2014-11-16 DIAGNOSIS — Z8632 Personal history of gestational diabetes: Secondary | ICD-10-CM

## 2014-11-16 DIAGNOSIS — O200 Threatened abortion: Secondary | ICD-10-CM

## 2014-11-16 DIAGNOSIS — Z8639 Personal history of other endocrine, nutritional and metabolic disease: Secondary | ICD-10-CM | POA: Insufficient documentation

## 2014-11-16 DIAGNOSIS — R636 Underweight: Secondary | ICD-10-CM | POA: Insufficient documentation

## 2014-11-16 DIAGNOSIS — Z3490 Encounter for supervision of normal pregnancy, unspecified, unspecified trimester: Secondary | ICD-10-CM | POA: Insufficient documentation

## 2014-11-16 LAB — POCT URINE PREGNANCY: PREG TEST UR: POSITIVE

## 2014-11-16 NOTE — Progress Notes (Signed)
[facility], told to follow up with clinic, but clinic was full. No longer bleeding or cramping. Bled for two days, then stopped. Patient is up to date with pap smears, has them done by Dr. Shelah LewandowskyElkin in [place]. Had been off BC prior to this, had nexplanon removed about six months ago.

## 2014-11-16 NOTE — Progress Notes (Signed)
Bedside transvaginal ultrasound shows crown rump length of 6 weeks and 1day.  Yolk sac is present and fetal heart rate is seen and heard on ultrasound.  Patients bleeding has stopped.  Last period was October 03, 2014.

## 2014-11-16 NOTE — Progress Notes (Signed)
    Subjective:    Patient ID: Kim [name] is a 24 y.o. female presenting with Threatened Miscarriage  on 11/16/2014  HPI: Here for hospital f/u.  Had threatened AB and had U/s which showed a yolk sac and BHCG of 1500. Here for f/u.  Needs New OB scheduled.  Interestingly, pt. Has h/o diabetes requiring insulin at age [age] and then honeymooning and only requiring treatment with orals while pregnant.  Review of Systems  Constitutional: Negative for fever and chills.  Respiratory: Negative for shortness of breath.   Cardiovascular: Negative for chest pain.  Gastrointestinal: Negative for nausea, vomiting and abdominal pain.  Genitourinary: Negative for dysuria and vaginal bleeding.  Skin: Negative for rash.      Objective:    BP 106/63 mmHg  Pulse 69  Ht 5\' 8"  (1.727 m)  Wt 107 lb (48.535 kg)  BMI 16.27 kg/m2  LMP 10/03/2014 Physical Exam  Constitutional: She is oriented to person, place, and time. She appears well-developed and well-nourished. No distress.  HENT:  Head: Normocephalic and atraumatic.  Eyes: No scleral icterus.  Neck: Neck supple.  Cardiovascular: Normal rate.   Pulmonary/Chest: Effort normal.  Abdominal: Soft.  Neurological: She is alert and oriented to person, place, and time.  Skin: Skin is warm and dry.  Psychiatric: She has a normal mood and affect.    TVUS show SIUP + flicker, 6 wk     Assessment & Plan:   Pregnancy-viable Return for new OB. Flu shot today. Return in about 4 weeks (around 12/14/2014).

## 2014-11-16 NOTE — Patient Instructions (Signed)
First Trimester of Pregnancy The first trimester of pregnancy is from week 1 until the end of week 12 (months 1 through 3). A week after a sperm fertilizes an egg, the egg will implant on the wall of the uterus. This embryo will begin to develop into a baby. Genes from you and your partner are forming the baby. The female genes determine whether the baby is a boy or a girl. At 6-8 weeks, the eyes and face are formed, and the heartbeat can be seen on ultrasound. At the end of 12 weeks, all the baby's organs are formed.  Now that you are pregnant, you will want to do everything you can to have a healthy baby. Two of the most important things are to get good prenatal care and to follow your health care provider's instructions. Prenatal care is all the medical care you receive before the baby's birth. This care will help prevent, find, and treat any problems during the pregnancy and childbirth. BODY CHANGES Your body goes through many changes during pregnancy. The changes vary from woman to woman.   You may gain or lose a couple of pounds at first.  You may feel sick to your stomach (nauseous) and throw up (vomit). If the vomiting is uncontrollable, call your health care provider.  You may tire easily.  You may develop headaches that can be relieved by medicines approved by your health care provider.  You may urinate more often. Painful urination may mean you have a bladder infection.  You may develop heartburn as a result of your pregnancy.  You may develop constipation because certain hormones are causing the muscles that push waste through your intestines to slow down.  You may develop hemorrhoids or swollen, bulging veins (varicose veins).  Your breasts may begin to grow larger and become tender. Your nipples may stick out more, and the tissue that surrounds them (areola) may become darker.  Your gums may bleed and may be sensitive to brushing and flossing.  Dark spots or blotches (chloasma,  mask of pregnancy) may develop on your face. This will likely fade after the baby is born.  Your menstrual periods will stop.  You may have a loss of appetite.  You may develop cravings for certain kinds of food.  You may have changes in your emotions from day to day, such as being excited to be pregnant or being concerned that something may go wrong with the pregnancy and baby.  You may have more vivid and strange dreams.  You may have changes in your hair. These can include thickening of your hair, rapid growth, and changes in texture. Some women also have hair loss during or after pregnancy, or hair that feels dry or thin. Your hair will most likely return to normal after your baby is born. WHAT TO EXPECT AT YOUR PRENATAL VISITS During a routine prenatal visit:  You will be weighed to make sure you and the baby are growing normally.  Your blood pressure will be taken.  Your abdomen will be measured to track your baby's growth.  The fetal heartbeat will be listened to starting around week 10 or 12 of your pregnancy.  Test results from any previous visits will be discussed. Your health care provider may ask you:  How you are feeling.  If you are feeling the baby move.  If you have had any abnormal symptoms, such as leaking fluid, bleeding, severe headaches, or abdominal cramping.  If you have any questions. Other tests   that may be performed during your first trimester include:  Blood tests to find your blood type and to check for the presence of any previous infections. They will also be used to check for low iron levels (anemia) and Rh antibodies. Later in the pregnancy, blood tests for diabetes will be done along with other tests if problems develop.  Urine tests to check for infections, diabetes, or protein in the urine.  An ultrasound to confirm the proper growth and development of the baby.  An amniocentesis to check for possible genetic problems.  Fetal screens for  spina bifida and Down syndrome.  You may need other tests to make sure you and the baby are doing well. HOME CARE INSTRUCTIONS  Medicines  Follow your health care provider's instructions regarding medicine use. Specific medicines may be either safe or unsafe to take during pregnancy.  Take your prenatal vitamins as directed.  If you develop constipation, try taking a stool softener if your health care provider approves. Diet  Eat regular, well-balanced meals. Choose a variety of foods, such as meat or vegetable-based protein, fish, milk and low-fat dairy products, vegetables, fruits, and whole grain breads and cereals. Your health care provider will help you determine the amount of weight gain that is right for you.  Avoid raw meat and uncooked cheese. These carry germs that can cause birth defects in the baby.  Eating four or five small meals rather than three large meals a day may help relieve nausea and vomiting. If you start to feel nauseous, eating a few soda crackers can be helpful. Drinking liquids between meals instead of during meals also seems to help nausea and vomiting.  If you develop constipation, eat more high-fiber foods, such as fresh vegetables or fruit and whole grains. Drink enough fluids to keep your urine clear or pale yellow. Activity and Exercise  Exercise only as directed by your health care provider. Exercising will help you:  Control your weight.  Stay in shape.  Be prepared for labor and delivery.  Experiencing pain or cramping in the lower abdomen or low back is a good sign that you should stop exercising. Check with your health care provider before continuing normal exercises.  Try to avoid standing for long periods of time. Move your legs often if you must stand in one place for a long time.  Avoid heavy lifting.  Wear low-heeled shoes, and practice good posture.  You may continue to have sex unless your health care provider directs you  otherwise. Relief of Pain or Discomfort  Wear a good support bra for breast tenderness.   Take warm sitz baths to soothe any pain or discomfort caused by hemorrhoids. Use hemorrhoid cream if your health care provider approves.   Rest with your legs elevated if you have leg cramps or low back pain.  If you develop varicose veins in your legs, wear support hose. Elevate your feet for 15 minutes, 3-4 times a day. Limit salt in your diet. Prenatal Care  Schedule your prenatal visits by the twelfth week of pregnancy. They are usually scheduled monthly at first, then more often in the last 2 months before delivery.  Write down your questions. Take them to your prenatal visits.  Keep all your prenatal visits as directed by your health care provider. Safety  Wear your seat belt at all times when driving.  Make a list of emergency phone numbers, including numbers for family, friends, the hospital, and police and fire departments. General Tips    Ask your health care provider for a referral to a local prenatal education class. Begin classes no later than at the beginning of month 6 of your pregnancy.  Ask for help if you have counseling or nutritional needs during pregnancy. Your health care provider can offer advice or refer you to specialists for help with various needs.  Do not use hot tubs, steam rooms, or saunas.  Do not douche or use tampons or scented sanitary pads.  Do not cross your legs for long periods of time.  Avoid cat litter boxes and soil used by cats. These carry germs that can cause birth defects in the baby and possibly loss of the fetus by miscarriage or stillbirth.  Avoid all smoking, herbs, alcohol, and medicines not prescribed by your health care provider. Chemicals in these affect the formation and growth of the baby.  Schedule a dentist appointment. At home, brush your teeth with a soft toothbrush and be gentle when you floss. SEEK MEDICAL CARE IF:   You have  dizziness.  You have mild pelvic cramps, pelvic pressure, or nagging pain in the abdominal area.  You have persistent nausea, vomiting, or diarrhea.  You have a bad smelling vaginal discharge.  You have pain with urination.  You notice increased swelling in your face, hands, legs, or ankles. SEEK IMMEDIATE MEDICAL CARE IF:   You have a fever.  You are leaking fluid from your vagina.  You have spotting or bleeding from your vagina.  You have severe abdominal cramping or pain.  You have rapid weight gain or loss.  You vomit blood or material that looks like coffee grounds.  You are exposed to German measles and have never had them.  You are exposed to fifth disease or chickenpox.  You develop a severe headache.  You have shortness of breath.  You have any kind of trauma, such as from a fall or a car accident. Document Released: 12/11/2001 Document Revised: 05/03/2014 Document Reviewed: 10/27/2013 [employer] Patient Information 2015 [employer]. This information is not intended to replace advice given to you by your health care provider. Make sure you discuss any questions you have with your health care provider.  

## 2014-12-10 ENCOUNTER — Emergency Department: Payer: Self-pay | Admitting: Emergency Medicine

## 2014-12-14 ENCOUNTER — Ambulatory Visit (INDEPENDENT_AMBULATORY_CARE_PROVIDER_SITE_OTHER): Payer: Self-pay | Admitting: Family Medicine

## 2014-12-14 ENCOUNTER — Encounter: Payer: Self-pay | Admitting: Family Medicine

## 2014-12-14 ENCOUNTER — Ambulatory Visit: Payer: Medicaid Other | Admitting: Family Medicine

## 2014-12-14 ENCOUNTER — Other Ambulatory Visit (HOSPITAL_COMMUNITY)
Admission: RE | Admit: 2014-12-14 | Discharge: 2014-12-14 | Disposition: A | Discharge status: Home / Self Care | Payer: Medicaid Other | Source: Ambulatory Visit | Attending: Family Medicine | Admitting: Family Medicine

## 2014-12-14 VITALS — BP 107/69 | HR 107 | Wt 109.0 lb

## 2014-12-14 DIAGNOSIS — Z01419 Encounter for gynecological examination (general) (routine) without abnormal findings: Secondary | ICD-10-CM | POA: Insufficient documentation

## 2014-12-14 DIAGNOSIS — Z113 Encounter for screening for infections with a predominantly sexual mode of transmission: Secondary | ICD-10-CM | POA: Diagnosis present

## 2014-12-14 DIAGNOSIS — O99330 Smoking (tobacco) complicating pregnancy, unspecified trimester: Secondary | ICD-10-CM | POA: Insufficient documentation

## 2014-12-14 DIAGNOSIS — O09299 Supervision of pregnancy with other poor reproductive or obstetric history, unspecified trimester: Secondary | ICD-10-CM

## 2014-12-14 DIAGNOSIS — Z124 Encounter for screening for malignant neoplasm of cervix: Secondary | ICD-10-CM

## 2014-12-14 DIAGNOSIS — F319 Bipolar disorder, unspecified: Secondary | ICD-10-CM

## 2014-12-14 DIAGNOSIS — J4540 Moderate persistent asthma, uncomplicated: Secondary | ICD-10-CM | POA: Insufficient documentation

## 2014-12-14 DIAGNOSIS — Z3491 Encounter for supervision of normal pregnancy, unspecified, first trimester: Secondary | ICD-10-CM

## 2014-12-14 DIAGNOSIS — O3421 Maternal care for scar from previous cesarean delivery: Secondary | ICD-10-CM

## 2014-12-14 DIAGNOSIS — F3130 Bipolar disorder, current episode depressed, mild or moderate severity, unspecified: Secondary | ICD-10-CM

## 2014-12-14 DIAGNOSIS — O34219 Maternal care for unspecified type scar from previous cesarean delivery: Secondary | ICD-10-CM | POA: Insufficient documentation

## 2014-12-14 DIAGNOSIS — O99331 Smoking (tobacco) complicating pregnancy, first trimester: Secondary | ICD-10-CM

## 2014-12-14 DIAGNOSIS — O09291 Supervision of pregnancy with other poor reproductive or obstetric history, first trimester: Secondary | ICD-10-CM

## 2014-12-14 HISTORY — DX: Supervision of pregnancy with other poor reproductive or obstetric history, unspecified trimester: O09.299

## 2014-12-14 MED ORDER — ALBUTEROL SULFATE HFA 108 (90 BASE) MCG/ACT IN AERS
2.0000 | INHALATION_SPRAY | Freq: Four times a day (QID) | RESPIRATORY_TRACT | Status: DC | PRN
Start: 2014-12-14 — End: 2018-06-10

## 2014-12-14 MED ORDER — ASPIRIN 81 MG PO CHEW: 81.0000 mg | CHEWABLE_TABLET | Freq: Every day | ORAL | Status: DC

## 2014-12-14 NOTE — Progress Notes (Signed)
   Subjective:    Kim [name] is a 51 being seen today for her first obstetrical visit.  Her obstetrical history is significant for pre-eclampsia and diabetes and previous c-section x 2. Medical history is significant for tobacco use and asthma. Pregnancy history fully reviewed.  Patient reports nausea, vomiting and asthma flare.  Filed Vitals:   12/14/14 1350  BP: 107/69  Pulse: 107  Weight: 109 lb (49.442 kg)    HISTORY: OB History  Gravida Para Term Preterm AB SAB TAB Ectopic Multiple Living  4 2 2  1 1    2     # Outcome Date GA Lbr Len/2nd Weight Sex Delivery Anes PTL Lv  4 Current           3 Term 02/04/11    F CS-LTranv   Y     Complications: Pre-eclampsia  2 Term 01/12/09    M CS-LTranv EPI N Y     Complications: Fetal Intolerance  1 SAB              Past Medical History  Diagnosis Date  . Chest pain   . SOB (shortness of breath)   . Depression   . Gestational diabetes   . Fibromyalgia   . Anemia   . Ulcer   . Rectal bleeding    Past Surgical History  Procedure Laterality Date  . Cesarean section     Family History  Problem Relation Age of Onset  . Depression Mother   . Fibromyalgia Mother   . Diabetes Maternal Grandmother   . Hypertension Maternal Grandmother   . Arthritis Maternal Grandmother   . Thyroid disease Maternal Grandmother      Exam    Uterus:   10 wk size  Pelvic Exam:    Perineum: Normal Perineum   Vulva: Bartholin's, Urethra, Skene's normal   Vagina:  normal mucosa, normal discharge   Cervix: multiparous appearance   Adnexa: no mass, fullness, tenderness   Bony Pelvis: average   Skin: normal coloration and turgor, no rashes    Neurologic: oriented   Extremities: normal strength, tone, and muscle mass   HEENT sclera clear, anicteric   Mouth/Teeth mucous membranes moist, pharynx normal without lesions and dental hygiene good   Neck supple   Cardiovascular: regular rate and rhythm, no murmurs or gallops   Respiratory:  appears well, vitals normal, no respiratory distress, acyanotic, normal RR, ear and throat exam is normal, neck free of mass or lymphadenopathy, chest clear, no wheezing, crepitations, rhonchi, normal symmetric air entry   Abdomen: soft, non-tender; bowel sounds normal; no masses,  no organomegaly      Assessment:    Pregnancy: [id number] Patient Active Problem List   Diagnosis Date Noted  . Supervision of normal pregnancy 11/16/2014  . History of gestational diabetes 11/16/2014  . Underweight 11/16/2014        Plan:     Initial labs drawn. Prenatal vitamins. Problem list reviewed and updated. Genetic Screening discussed First Screen: declined. Ultrasound discussed; fetal survey: discussed. Baby ASA for h/o pre-eclampsia x 2--begin after 12 wks. Baseline labs and Hgb A1c for h/o pre-eclampsia and GDM. Refill medications--await medication dosing for Flovent Smoking cessation. Follow up in 4 weeks.    Kim [name] S 12/14/2014

## 2014-12-14 NOTE — Assessment & Plan Note (Signed)
  [facility]  Dating  Blood type: --/--/O POS (11/08 1245)  Genetic Screen 1 Screen:                 AFP:                    Quad:                  NIPS: Antibody:   Anatomic US  Rubella:    GTT Early:               Third trimester:  RPR: NON REAC (11/08 1238)   TDaP vaccine  HBsAg:     Flu vaccine 11/16/14 HIV: NONREACTIVE (11/08 1238)   GBS  GBS:   Contraception  Pap:  Baby Food    Circumcision    Pediatrician    Support Person

## 2014-12-14 NOTE — Patient Instructions (Signed)
First Trimester of Pregnancy The first trimester of pregnancy is from week 1 until the end of week 12 (months 1 through 3). A week after a sperm fertilizes an egg, the egg will implant on the wall of the uterus. This embryo will begin to develop into a baby. Genes from you and your partner are forming the baby. The female genes determine whether the baby is a boy or a girl. At 6-8 weeks, the eyes and face are formed, and the heartbeat can be seen on ultrasound. At the end of 12 weeks, all the baby's organs are formed.  Now that you are pregnant, you will want to do everything you can to have a healthy baby. Two of the most important things are to get good prenatal care and to follow your health care provider's instructions. Prenatal care is all the medical care you receive before the baby's birth. This care will help prevent, find, and treat any problems during the pregnancy and childbirth. BODY CHANGES Your body goes through many changes during pregnancy. The changes vary from woman to woman.   You may gain or lose a couple of pounds at first.  You may feel sick to your stomach (nauseous) and throw up (vomit). If the vomiting is uncontrollable, call your health care provider.  You may tire easily.  You may develop headaches that can be relieved by medicines approved by your health care provider.  You may urinate more often. Painful urination may mean you have a bladder infection.  You may develop heartburn as a result of your pregnancy.  You may develop constipation because certain hormones are causing the muscles that push waste through your intestines to slow down.  You may develop hemorrhoids or swollen, bulging veins (varicose veins).  Your breasts may begin to grow larger and become tender. Your nipples may stick out more, and the tissue that surrounds them (areola) may become darker.  Your gums may bleed and may be sensitive to brushing and flossing.  Dark spots or blotches  (chloasma, mask of pregnancy) may develop on your face. This will likely fade after the baby is born.  Your menstrual periods will stop.  You may have a loss of appetite.  You may develop cravings for certain kinds of food.  You may have changes in your emotions from day to day, such as being excited to be pregnant or being concerned that something may go wrong with the pregnancy and baby.  You may have more vivid and strange dreams.  You may have changes in your hair. These can include thickening of your hair, rapid growth, and changes in texture. Some women also have hair loss during or after pregnancy, or hair that feels dry or thin. Your hair will most likely return to normal after your baby is born. WHAT TO EXPECT AT YOUR PRENATAL VISITS During a routine prenatal visit:  You will be weighed to make sure you and the baby are growing normally.  Your blood pressure will be taken.  Your abdomen will be measured to track your baby's growth.  The fetal heartbeat will be listened to starting around week 10 or 12 of your pregnancy.  Test results from any previous visits will be discussed. Your health care provider may ask you:  How you are feeling.  If you are feeling the baby move.  If you have had any abnormal symptoms, such as leaking fluid, bleeding, severe headaches, or abdominal cramping.  If you have any questions. Other tests   that may be performed during your first trimester include:  Blood tests to find your blood type and to check for the presence of any previous infections. They will also be used to check for low iron levels (anemia) and Rh antibodies. Later in the pregnancy, blood tests for diabetes will be done along with other tests if problems develop.  Urine tests to check for infections, diabetes, or protein in the urine.  An ultrasound to confirm the proper growth and development of the baby.  An amniocentesis to check for possible genetic problems.  Fetal  screens for spina bifida and Down syndrome.  You may need other tests to make sure you and the baby are doing well. HOME CARE INSTRUCTIONS  Medicines  Follow your health care provider's instructions regarding medicine use. Specific medicines may be either safe or unsafe to take during pregnancy.  Take your prenatal vitamins as directed.  If you develop constipation, try taking a stool softener if your health care provider approves. Diet  Eat regular, well-balanced meals. Choose a variety of foods, such as meat or vegetable-based protein, fish, milk and low-fat dairy products, vegetables, fruits, and whole grain breads and cereals. Your health care provider will help you determine the amount of weight gain that is right for you.  Avoid raw meat and uncooked cheese. These carry germs that can cause birth defects in the baby.  Eating four or five small meals rather than three large meals a day may help relieve nausea and vomiting. If you start to feel nauseous, eating a few soda crackers can be helpful. Drinking liquids between meals instead of during meals also seems to help nausea and vomiting.  If you develop constipation, eat more high-fiber foods, such as fresh vegetables or fruit and whole grains. Drink enough fluids to keep your urine clear or pale yellow. Activity and Exercise  Exercise only as directed by your health care provider. Exercising will help you:  Control your weight.  Stay in shape.  Be prepared for labor and delivery.  Experiencing pain or cramping in the lower abdomen or low back is a good sign that you should stop exercising. Check with your health care provider before continuing normal exercises.  Try to avoid standing for long periods of time. Move your legs often if you must stand in one place for a long time.  Avoid heavy lifting.  Wear low-heeled shoes, and practice good posture.  You may continue to have sex unless your health care provider directs you  otherwise. Relief of Pain or Discomfort  Wear a good support bra for breast tenderness.   Take warm sitz baths to soothe any pain or discomfort caused by hemorrhoids. Use hemorrhoid cream if your health care provider approves.   Rest with your legs elevated if you have leg cramps or low back pain.  If you develop varicose veins in your legs, wear support hose. Elevate your feet for 15 minutes, 3-4 times a day. Limit salt in your diet. Prenatal Care  Schedule your prenatal visits by the twelfth week of pregnancy. They are usually scheduled monthly at first, then more often in the last 2 months before delivery.  Write down your questions. Take them to your prenatal visits.  Keep all your prenatal visits as directed by your health care provider. Safety  Wear your seat belt at all times when driving.  Make a list of emergency phone numbers, including numbers for family, friends, the hospital, and police and fire departments. General Tips    Ask your health care provider for a referral to a local prenatal education class. Begin classes no later than at the beginning of month 6 of your pregnancy.  Ask for help if you have counseling or nutritional needs during pregnancy. Your health care provider can offer advice or refer you to specialists for help with various needs.  Do not use hot tubs, steam rooms, or saunas.  Do not douche or use tampons or scented sanitary pads.  Do not cross your legs for long periods of time.  Avoid cat litter boxes and soil used by cats. These carry germs that can cause birth defects in the baby and possibly loss of the fetus by miscarriage or stillbirth.  Avoid all smoking, herbs, alcohol, and medicines not prescribed by your health care provider. Chemicals in these affect the formation and growth of the baby.  Schedule a dentist appointment. At home, brush your teeth with a soft toothbrush and be gentle when you floss. SEEK MEDICAL CARE IF:   You have  dizziness.  You have mild pelvic cramps, pelvic pressure, or nagging pain in the abdominal area.  You have persistent nausea, vomiting, or diarrhea.  You have a bad smelling vaginal discharge.  You have pain with urination.  You notice increased swelling in your face, hands, legs, or ankles. SEEK IMMEDIATE MEDICAL CARE IF:   You have a fever.  You are leaking fluid from your vagina.  You have spotting or bleeding from your vagina.  You have severe abdominal cramping or pain.  You have rapid weight gain or loss.  You vomit blood or material that looks like coffee grounds.  You are exposed to German measles and have never had them.  You are exposed to fifth disease or chickenpox.  You develop a severe headache.  You have shortness of breath.  You have any kind of trauma, such as from a fall or a car accident. Document Released: 12/11/2001 Document Revised: 05/03/2014 Document Reviewed: 10/27/2013 [employer] Patient Information 2015 [employer]. This information is not intended to replace advice given to you by your health care provider. Make sure you discuss any questions you have with your health care provider.  Breastfeeding Deciding to breastfeed is one of the best choices you can make for you and your baby. A change in hormones during pregnancy causes your breast tissue to grow and increases the number and size of your milk ducts. These hormones also allow proteins, sugars, and fats from your blood supply to make breast milk in your milk-producing glands. Hormones prevent breast milk from being released before your baby is born as well as prompt milk flow after birth. Once breastfeeding has begun, thoughts of your baby, as well as his or her sucking or crying, can stimulate the release of milk from your milk-producing glands.  BENEFITS OF BREASTFEEDING For Your Baby  Your first milk (colostrum) helps your baby's digestive system function better.   There are antibodies  in your milk that help your baby fight off infections.   Your baby has a lower incidence of asthma, allergies, and sudden infant death syndrome.   The nutrients in breast milk are better for your baby than infant formulas and are designed uniquely for your baby's needs.   Breast milk improves your baby's brain development.   Your baby is less likely to develop other conditions, such as childhood obesity, asthma, or type 2 diabetes mellitus.  For You   Breastfeeding helps to create a very special bond between   you and your baby.   Breastfeeding is convenient. Breast milk is always available at the correct temperature and costs nothing.   Breastfeeding helps to burn calories and helps you lose the weight gained during pregnancy.   Breastfeeding makes your uterus contract to its prepregnancy size faster and slows bleeding (lochia) after you give birth.   Breastfeeding helps to lower your risk of developing type 2 diabetes mellitus, osteoporosis, and breast or ovarian cancer later in life. SIGNS THAT YOUR BABY IS HUNGRY Early Signs of Hunger  Increased alertness or activity.  Stretching.  Movement of the head from side to side.  Movement of the head and opening of the mouth when the corner of the mouth or cheek is stroked (rooting).  Increased sucking sounds, smacking lips, cooing, sighing, or squeaking.  Hand-to-mouth movements.  Increased sucking of fingers or hands. Late Signs of Hunger  Fussing.  Intermittent crying. Extreme Signs of Hunger Signs of extreme hunger will require calming and consoling before your baby will be able to breastfeed successfully. Do not wait for the following signs of extreme hunger to occur before you initiate breastfeeding:   Restlessness.  A loud, strong cry.   Screaming. BREASTFEEDING BASICS Breastfeeding Initiation  Find a comfortable place to sit or lie down, with your neck and back well supported.  Place a pillow or  rolled up blanket under your baby to bring him or her to the level of your breast (if you are seated). Nursing pillows are specially designed to help support your arms and your baby while you breastfeed.  Make sure that your baby's abdomen is facing your abdomen.   Gently massage your breast. With your fingertips, massage from your chest wall toward your nipple in a circular motion. This encourages milk flow. You may need to continue this action during the feeding if your milk flows slowly.  Support your breast with 4 fingers underneath and your thumb above your nipple. Make sure your fingers are well away from your nipple and your baby's mouth.   Stroke your baby's lips gently with your finger or nipple.   When your baby's mouth is open wide enough, quickly bring your baby to your breast, placing your entire nipple and as much of the colored area around your nipple (areola) as possible into your baby's mouth.   More areola should be visible above your baby's upper lip than below the lower lip.   Your baby's tongue should be between his or her lower gum and your breast.   Ensure that your baby's mouth is correctly positioned around your nipple (latched). Your baby's lips should create a seal on your breast and be turned out (everted).  It is common for your baby to suck about 2-3 minutes in order to start the flow of breast milk. Latching Teaching your baby how to latch on to your breast properly is very important. An improper latch can cause nipple pain and decreased milk supply for you and poor weight gain in your baby. Also, if your baby is not latched onto your nipple properly, he or she may swallow some air during feeding. This can make your baby fussy. Burping your baby when you switch breasts during the feeding can help to get rid of the air. However, teaching your baby to latch on properly is still the best way to prevent fussiness from swallowing air while breastfeeding. Signs  that your baby has successfully latched on to your nipple:    Silent tugging or silent   sucking, without causing you pain.   Swallowing heard between every 3-4 sucks.    Muscle movement above and in front of his or her ears while sucking.  Signs that your baby has not successfully latched on to nipple:   Sucking sounds or smacking sounds from your baby while breastfeeding.  Nipple pain. If you think your baby has not latched on correctly, slip your finger into the corner of your baby's mouth to break the suction and place it between your baby's gums. Attempt breastfeeding initiation again. Signs of Successful Breastfeeding Signs from your baby:   A gradual decrease in the number of sucks or complete cessation of sucking.   Falling asleep.   Relaxation of his or her body.   Retention of a small amount of milk in his or her mouth.   Letting go of your breast by himself or herself. Signs from you:  Breasts that have increased in firmness, weight, and size 1-3 hours after feeding.   Breasts that are softer immediately after breastfeeding.  Increased milk volume, as well as a change in milk consistency and color by the fifth day of breastfeeding.   Nipples that are not sore, cracked, or bleeding. Signs That Your Baby is Getting Enough Milk  Wetting at least 3 diapers in a 24-hour period. The urine should be clear and pale yellow by age 5 days.  At least 3 stools in a 24-hour period by age 5 days. The stool should be soft and yellow.  At least 3 stools in a 24-hour period by age 7 days. The stool should be seedy and yellow.  No loss of weight greater than 10% of birth weight during the first 3 days of age.  Average weight gain of 4-7 ounces (113-198 g) per week after age 4 days.  Consistent daily weight gain by age 5 days, without weight loss after the age of 2 weeks. After a feeding, your baby may spit up a small amount. This is common. BREASTFEEDING FREQUENCY AND  DURATION Frequent feeding will help you make more milk and can prevent sore nipples and breast engorgement. Breastfeed when you feel the need to reduce the fullness of your breasts or when your baby shows signs of hunger. This is called "breastfeeding on demand." Avoid introducing a pacifier to your baby while you are working to establish breastfeeding (the first 4-6 weeks after your baby is born). After this time you may choose to use a pacifier. Research has shown that pacifier use during the first year of a baby's life decreases the risk of sudden infant death syndrome (SIDS). Allow your baby to feed on each breast as long as he or she wants. Breastfeed until your baby is finished feeding. When your baby unlatches or falls asleep while feeding from the first breast, offer the second breast. Because newborns are often sleepy in the first few weeks of life, you may need to awaken your baby to get him or her to feed. Breastfeeding times will vary from baby to baby. However, the following rules can serve as a guide to help you ensure that your baby is properly fed:  Newborns (babies 4 weeks of age or younger) may breastfeed every 1-3 hours.  Newborns should not go longer than 3 hours during the day or 5 hours during the night without breastfeeding.  You should breastfeed your baby a minimum of 8 times in a 24-hour period until you begin to introduce solid foods to your baby at around 6   months of age. BREAST MILK PUMPING Pumping and storing breast milk allows you to ensure that your baby is exclusively fed your breast milk, even at times when you are unable to breastfeed. This is especially important if you are going back to work while you are still breastfeeding or when you are not able to be present during feedings. Your lactation consultant can give you guidelines on how long it is safe to store breast milk.  A breast pump is a machine that allows you to pump milk from your breast into a sterile bottle.  The pumped breast milk can then be stored in a refrigerator or freezer. Some breast pumps are operated by hand, while others use electricity. Ask your lactation consultant which type will work best for you. Breast pumps can be purchased, but some hospitals and breastfeeding support groups lease breast pumps on a monthly basis. A lactation consultant can teach you how to hand express breast milk, if you prefer not to use a pump.  CARING FOR YOUR BREASTS WHILE YOU BREASTFEED Nipples can become dry, cracked, and sore while breastfeeding. The following recommendations can help keep your breasts moisturized and healthy:  Avoid using soap on your nipples.   Wear a supportive bra. Although not required, special nursing bras and tank tops are designed to allow access to your breasts for breastfeeding without taking off your entire bra or top. Avoid wearing underwire-style bras or extremely tight bras.  Air dry your nipples for 3-4minutes after each feeding.   Use only cotton bra pads to absorb leaked breast milk. Leaking of breast milk between feedings is normal.   Use lanolin on your nipples after breastfeeding. Lanolin helps to maintain your skin's normal moisture barrier. If you use pure lanolin, you do not need to wash it off before feeding your baby again. Pure lanolin is not toxic to your baby. You may also hand express a few drops of breast milk and gently massage that milk into your nipples and allow the milk to air dry. In the first few weeks after giving birth, some women experience extremely full breasts (engorgement). Engorgement can make your breasts feel heavy, warm, and tender to the touch. Engorgement peaks within 3-5 days after you give birth. The following recommendations can help ease engorgement:  Completely empty your breasts while breastfeeding or pumping. You may want to start by applying warm, moist heat (in the shower or with warm water-soaked hand towels) just before feeding or  pumping. This increases circulation and helps the milk flow. If your baby does not completely empty your breasts while breastfeeding, pump any extra milk after he or she is finished.  Wear a snug bra (nursing or regular) or tank top for 1-2 days to signal your body to slightly decrease milk production.  Apply ice packs to your breasts, unless this is too uncomfortable for you.  Make sure that your baby is latched on and positioned properly while breastfeeding. If engorgement persists after 48 hours of following these recommendations, contact your health care provider or a lactation consultant. OVERALL HEALTH CARE RECOMMENDATIONS WHILE BREASTFEEDING  Eat healthy foods. Alternate between meals and snacks, eating 3 of each per day. Because what you eat affects your breast milk, some of the foods may make your baby more irritable than usual. Avoid eating these foods if you are sure that they are negatively affecting your baby.  Drink milk, fruit juice, and water to satisfy your thirst (about 10 glasses a day).   Rest   often, relax, and continue to take your prenatal vitamins to prevent fatigue, stress, and anemia.  Continue breast self-awareness checks.  Avoid chewing and smoking tobacco.  Avoid alcohol and drug use. Some medicines that may be harmful to your baby can pass through breast milk. It is important to ask your health care provider before taking any medicine, including all over-the-counter and prescription medicine as well as vitamin and herbal supplements. It is possible to become pregnant while breastfeeding. If birth control is desired, ask your health care provider about options that will be safe for your baby. SEEK MEDICAL CARE IF:   You feel like you want to stop breastfeeding or have become frustrated with breastfeeding.  You have painful breasts or nipples.  Your nipples are cracked or bleeding.  Your breasts are red, tender, or warm.  You have a swollen area on either  breast.  You have a fever or chills.  You have nausea or vomiting.  You have drainage other than breast milk from your nipples.  Your breasts do not become full before feedings by the fifth day after you give birth.  You feel sad and depressed.  Your baby is too sleepy to eat well.  Your baby is having trouble sleeping.   Your baby is wetting less than 3 diapers in a 24-hour period.  Your baby has less than 3 stools in a 24-hour period.  Your baby's skin or the white part of his or her eyes becomes yellow.   Your baby is not gaining weight by 5 days of age. SEEK IMMEDIATE MEDICAL CARE IF:   Your baby is overly tired (lethargic) and does not want to wake up and feed.  Your baby develops an unexplained fever. Document Released: 12/17/2005 Document Revised: 12/22/2013 Document Reviewed: 06/10/2013 [employer] Patient Information 2015 [employer]. This information is not intended to replace advice given to you by your health care provider. Make sure you discuss any questions you have with your health care provider.  

## 2014-12-15 LAB — PRENATAL PROFILE (SOLSTAS)
Antibody Screen: NEGATIVE
BASOS ABS: 0 10*3/uL (ref 0.0–0.1)
Basophils Relative: 0 % (ref 0–1)
EOS ABS: 0.2 10*3/uL (ref 0.0–0.7)
EOS PCT: 2 % (ref 0–5)
HCT: 32.5 % — ABNORMAL LOW (ref 36.0–46.0)
HEP B S AG: NEGATIVE
HIV 1&2 Ab, 4th Generation: NONREACTIVE
Hemoglobin: 11 g/dL — ABNORMAL LOW (ref 12.0–15.0)
LYMPHS PCT: 24 % (ref 12–46)
Lymphs Abs: 2.4 10*3/uL (ref 0.7–4.0)
MCH: 28.2 pg (ref 26.0–34.0)
MCHC: 33.8 g/dL (ref 30.0–36.0)
MCV: 83.3 fL (ref 78.0–100.0)
MPV: 10.3 fL (ref 9.4–12.4)
Monocytes Absolute: 0.6 10*3/uL (ref 0.1–1.0)
Monocytes Relative: 6 % (ref 3–12)
NEUTROS PCT: 68 % (ref 43–77)
Neutro Abs: 6.7 10*3/uL (ref 1.7–7.7)
PLATELETS: 254 10*3/uL (ref 150–400)
RBC: 3.9 MIL/uL (ref 3.87–5.11)
RDW: 14.5 % (ref 11.5–15.5)
Rh Type: POSITIVE
Rubella: 3 Index — ABNORMAL HIGH (ref <0.90)
WBC: 9.8 10*3/uL (ref 4.0–10.5)

## 2014-12-15 LAB — COMPREHENSIVE METABOLIC PANEL
ALT: 8 U/L (ref 0–35)
AST: 14 U/L (ref 0–37)
Albumin: 3.9 g/dL (ref 3.5–5.2)
Alkaline Phosphatase: 55 U/L (ref 39–117)
BILIRUBIN TOTAL: 0.2 mg/dL (ref 0.2–1.2)
BUN: 11 mg/dL (ref 6–23)
CO2: 24 mEq/L (ref 19–32)
Calcium: 9 mg/dL (ref 8.4–10.5)
Chloride: 106 mEq/L (ref 96–112)
Creat: 0.53 mg/dL (ref 0.50–1.10)
Glucose, Bld: 85 mg/dL (ref 70–99)
Potassium: 3.5 mEq/L (ref 3.5–5.3)
Sodium: 138 mEq/L (ref 135–145)
Total Protein: 6.2 g/dL (ref 6.0–8.3)

## 2014-12-15 LAB — PROTEIN / CREATININE RATIO, URINE
Creatinine, Urine: 114.8 mg/dL
Protein Creatinine Ratio: 0.19 — ABNORMAL HIGH (ref <0.15)
Total Protein, Urine: 22 mg/dL (ref 5–24)

## 2014-12-15 LAB — HEMOGLOBIN A1C
HEMOGLOBIN A1C: 5.5 % (ref <5.7)
MEAN PLASMA GLUCOSE: 111 mg/dL (ref <117)

## 2014-12-16 LAB — CULTURE, OB URINE

## 2014-12-16 LAB — CYTOLOGY - PAP

## 2015-01-10 ENCOUNTER — Ambulatory Visit (INDEPENDENT_AMBULATORY_CARE_PROVIDER_SITE_OTHER): Payer: Self-pay | Admitting: Obstetrics and Gynecology

## 2015-01-10 ENCOUNTER — Encounter: Payer: Self-pay | Admitting: Obstetrics and Gynecology

## 2015-01-10 VITALS — BP 103/62 | HR 59 | Wt 108.0 lb

## 2015-01-10 DIAGNOSIS — Z8632 Personal history of gestational diabetes: Secondary | ICD-10-CM

## 2015-01-10 DIAGNOSIS — O3421 Maternal care for scar from previous cesarean delivery: Secondary | ICD-10-CM

## 2015-01-10 DIAGNOSIS — J4540 Moderate persistent asthma, uncomplicated: Secondary | ICD-10-CM

## 2015-01-10 DIAGNOSIS — F319 Bipolar disorder, unspecified: Secondary | ICD-10-CM

## 2015-01-10 DIAGNOSIS — O09292 Supervision of pregnancy with other poor reproductive or obstetric history, second trimester: Secondary | ICD-10-CM

## 2015-01-10 DIAGNOSIS — O34219 Maternal care for unspecified type scar from previous cesarean delivery: Secondary | ICD-10-CM

## 2015-01-10 DIAGNOSIS — F3130 Bipolar disorder, current episode depressed, mild or moderate severity, unspecified: Secondary | ICD-10-CM

## 2015-01-10 DIAGNOSIS — O99332 Smoking (tobacco) complicating pregnancy, second trimester: Secondary | ICD-10-CM

## 2015-01-10 DIAGNOSIS — Z3492 Encounter for supervision of normal pregnancy, unspecified, second trimester: Secondary | ICD-10-CM

## 2015-01-10 NOTE — Progress Notes (Signed)
Patient is doing well without complaints. Anatomy ultrasound ordered. Patient has significantly decreased her smoking habit but is finding it hard to keep up. Patient referred to DSS who will assist her further. I congratulated her on her efforts and encouraged her to continue.

## 2015-02-07 ENCOUNTER — Ambulatory Visit (INDEPENDENT_AMBULATORY_CARE_PROVIDER_SITE_OTHER): Payer: Self-pay | Admitting: Obstetrics and Gynecology

## 2015-02-07 ENCOUNTER — Encounter: Payer: Self-pay | Admitting: Obstetrics and Gynecology

## 2015-02-07 ENCOUNTER — Encounter: Payer: Medicaid Other | Admitting: Obstetrics and Gynecology

## 2015-02-07 VITALS — BP 110/60 | HR 75 | Wt 110.0 lb

## 2015-02-07 DIAGNOSIS — O99332 Smoking (tobacco) complicating pregnancy, second trimester: Secondary | ICD-10-CM

## 2015-02-07 DIAGNOSIS — O3421 Maternal care for scar from previous cesarean delivery: Secondary | ICD-10-CM

## 2015-02-07 DIAGNOSIS — Z8632 Personal history of gestational diabetes: Secondary | ICD-10-CM

## 2015-02-07 DIAGNOSIS — F3130 Bipolar disorder, current episode depressed, mild or moderate severity, unspecified: Secondary | ICD-10-CM

## 2015-02-07 DIAGNOSIS — O09292 Supervision of pregnancy with other poor reproductive or obstetric history, second trimester: Secondary | ICD-10-CM

## 2015-02-07 DIAGNOSIS — Z3492 Encounter for supervision of normal pregnancy, unspecified, second trimester: Secondary | ICD-10-CM

## 2015-02-07 DIAGNOSIS — F319 Bipolar disorder, unspecified: Secondary | ICD-10-CM

## 2015-02-07 DIAGNOSIS — O34219 Maternal care for unspecified type scar from previous cesarean delivery: Secondary | ICD-10-CM

## 2015-02-07 NOTE — Progress Notes (Signed)
Patient is doing well without complaints. Anatomy ultrasound scheduled for 2/9. Continue daily ASA. Patient is down to 1 cigarette per week. Encouraged her to quit all together

## 2015-02-08 ENCOUNTER — Ambulatory Visit (HOSPITAL_COMMUNITY): Payer: Medicaid Other

## 2015-02-08 ENCOUNTER — Ambulatory Visit (HOSPITAL_COMMUNITY)
Admission: RE | Admit: 2015-02-08 | Discharge: 2015-02-08 | Disposition: A | Discharge status: Home / Self Care | Payer: Medicaid Other | Source: Ambulatory Visit | Attending: Obstetrics and Gynecology | Admitting: Obstetrics and Gynecology

## 2015-02-08 DIAGNOSIS — O99332 Smoking (tobacco) complicating pregnancy, second trimester: Secondary | ICD-10-CM | POA: Insufficient documentation

## 2015-02-08 DIAGNOSIS — Z3A18 18 weeks gestation of pregnancy: Secondary | ICD-10-CM | POA: Diagnosis not present

## 2015-02-08 DIAGNOSIS — F17210 Nicotine dependence, cigarettes, uncomplicated: Secondary | ICD-10-CM | POA: Insufficient documentation

## 2015-02-08 DIAGNOSIS — Z3689 Encounter for other specified antenatal screening: Secondary | ICD-10-CM | POA: Insufficient documentation

## 2015-02-08 DIAGNOSIS — O3421 Maternal care for scar from previous cesarean delivery: Secondary | ICD-10-CM | POA: Insufficient documentation

## 2015-02-08 DIAGNOSIS — Z3492 Encounter for supervision of normal pregnancy, unspecified, second trimester: Secondary | ICD-10-CM

## 2015-02-08 DIAGNOSIS — O09292 Supervision of pregnancy with other poor reproductive or obstetric history, second trimester: Secondary | ICD-10-CM | POA: Insufficient documentation

## 2015-03-07 ENCOUNTER — Encounter: Payer: Self-pay | Admitting: Obstetrics & Gynecology

## 2015-03-07 ENCOUNTER — Ambulatory Visit (INDEPENDENT_AMBULATORY_CARE_PROVIDER_SITE_OTHER): Payer: Self-pay | Admitting: Obstetrics & Gynecology

## 2015-03-07 VITALS — BP 111/67 | HR 91 | Wt 113.8 lb

## 2015-03-07 DIAGNOSIS — Z3492 Encounter for supervision of normal pregnancy, unspecified, second trimester: Secondary | ICD-10-CM

## 2015-03-07 MED ORDER — ALPRAZOLAM 0.5 MG PO TABS: 0.5000 mg | ORAL_TABLET | Freq: Two times a day (BID) | ORAL | Status: DC | PRN

## 2015-03-07 NOTE — Progress Notes (Signed)
She is checking her sugars 3 times per day (fbs, 2 hours after lunch and after dinner). She says fbs all less than 90, 2 hour pp highest was 167 but most are about 115-120. She was diagnosed with DM at [age] yo and treated with insulin until 25 yo when her pancreas suddenly started producing insulin. She was on glyburide with her first pregnancy, but not the second. She has been diagnosed with bipolar depression at the [facility], seen last about 2 years ago. She was on prozac 40 mg, seroquel 75 mg, and klonipin 0.5 mg BID. She stopped these cold [place] about 3 days after being diagnosed with pregnancy. Her finace says she is driving everyone crazy. She has felt like she gets overwhelmed very easily. I will refer her to a psychiatrist. In the meantime, I have given her a prescription for xanax 0.5 mg BIDprn. She has stopped smoking and she using nicotine patches.

## 2015-03-07 NOTE — Progress Notes (Signed)
Routine visit. Good FM. No problems.  

## 2015-03-12 ENCOUNTER — Emergency Department: Payer: Self-pay | Admitting: Student

## 2015-03-15 ENCOUNTER — Ambulatory Visit (INDEPENDENT_AMBULATORY_CARE_PROVIDER_SITE_OTHER): Payer: Self-pay | Admitting: Obstetrics & Gynecology

## 2015-03-15 VITALS — BP 114/64 | HR 91 | Wt 116.4 lb

## 2015-03-15 DIAGNOSIS — O9229 Other disorders of breast associated with pregnancy and the puerperium: Secondary | ICD-10-CM

## 2015-03-15 DIAGNOSIS — O34219 Maternal care for unspecified type scar from previous cesarean delivery: Secondary | ICD-10-CM

## 2015-03-15 DIAGNOSIS — N644 Mastodynia: Secondary | ICD-10-CM

## 2015-03-15 DIAGNOSIS — Z3492 Encounter for supervision of normal pregnancy, unspecified, second trimester: Secondary | ICD-10-CM

## 2015-03-15 DIAGNOSIS — O3421 Maternal care for scar from previous cesarean delivery: Secondary | ICD-10-CM

## 2015-03-15 MED ORDER — CEPHALEXIN 500 MG PO CAPS: 500.0000 mg | ORAL_CAPSULE | Freq: Four times a day (QID) | ORAL | Status: DC

## 2015-03-15 MED ORDER — FLUCONAZOLE 150 MG PO TABS: 150.0000 mg | ORAL_TABLET | Freq: Once | ORAL | Status: DC

## 2015-03-15 MED ORDER — OXYCODONE-ACETAMINOPHEN 5-325 MG PO TABS: 1.0000 | ORAL_TABLET | ORAL | Status: DC | PRN

## 2015-03-15 NOTE — Progress Notes (Signed)
She is here because of left breast swelling and pain. She was seen at [facility] and was told to take Tylenol. On exam today, she has what appears to be a small pimple/lesion with an associated painful, red swelling of the left breast. I will treat her with keflex and percocet and diflucan prn yeast infection. She has no OB problems, reports good FM.

## 2015-03-15 NOTE — Addendum Note (Signed)
Addended by: Barbara CowerNOGUES, Syana Degraffenreid L on: 03/15/2015 10:38 AM   Modules accepted: Medications

## 2015-03-29 ENCOUNTER — Encounter: Payer: Self-pay | Admitting: Family Medicine

## 2015-04-04 ENCOUNTER — Ambulatory Visit (HOSPITAL_COMMUNITY)
Admission: RE | Admit: 2015-04-04 | Discharge: 2015-04-04 | Disposition: A | Discharge status: Home / Self Care | Payer: Medicaid Other | Source: Ambulatory Visit | Attending: Obstetrics & Gynecology | Admitting: Obstetrics & Gynecology

## 2015-04-04 DIAGNOSIS — Z3492 Encounter for supervision of normal pregnancy, unspecified, second trimester: Secondary | ICD-10-CM

## 2015-04-04 DIAGNOSIS — Z36 Encounter for antenatal screening of mother: Secondary | ICD-10-CM | POA: Insufficient documentation

## 2015-04-04 DIAGNOSIS — O99332 Smoking (tobacco) complicating pregnancy, second trimester: Secondary | ICD-10-CM | POA: Insufficient documentation

## 2015-04-04 DIAGNOSIS — F17210 Nicotine dependence, cigarettes, uncomplicated: Secondary | ICD-10-CM | POA: Insufficient documentation

## 2015-04-04 DIAGNOSIS — Z0489 Encounter for examination and observation for other specified reasons: Secondary | ICD-10-CM | POA: Insufficient documentation

## 2015-04-04 DIAGNOSIS — Z3A26 26 weeks gestation of pregnancy: Secondary | ICD-10-CM | POA: Diagnosis not present

## 2015-04-04 DIAGNOSIS — O3421 Maternal care for scar from previous cesarean delivery: Secondary | ICD-10-CM | POA: Insufficient documentation

## 2015-04-04 DIAGNOSIS — O09292 Supervision of pregnancy with other poor reproductive or obstetric history, second trimester: Secondary | ICD-10-CM | POA: Insufficient documentation

## 2015-04-04 DIAGNOSIS — IMO0002 Reserved for concepts with insufficient information to code with codable children: Secondary | ICD-10-CM | POA: Insufficient documentation

## 2015-04-13 ENCOUNTER — Ambulatory Visit (INDEPENDENT_AMBULATORY_CARE_PROVIDER_SITE_OTHER): Payer: Self-pay | Admitting: Obstetrics & Gynecology

## 2015-04-13 VITALS — BP 99/64 | HR 80 | Wt 116.0 lb

## 2015-04-13 DIAGNOSIS — Z36 Encounter for antenatal screening of mother: Secondary | ICD-10-CM

## 2015-04-13 DIAGNOSIS — Z8639 Personal history of other endocrine, nutritional and metabolic disease: Secondary | ICD-10-CM

## 2015-04-13 DIAGNOSIS — Z3493 Encounter for supervision of normal pregnancy, unspecified, third trimester: Secondary | ICD-10-CM

## 2015-04-13 DIAGNOSIS — Z23 Encounter for immunization: Secondary | ICD-10-CM

## 2015-04-13 LAB — CBC
HCT: 29.3 % — ABNORMAL LOW (ref 36.0–46.0)
Hemoglobin: 9.8 g/dL — ABNORMAL LOW (ref 12.0–15.0)
MCH: 29.5 pg (ref 26.0–34.0)
MCHC: 33.4 g/dL (ref 30.0–36.0)
MCV: 88.3 fL (ref 78.0–100.0)
MPV: 10.5 fL (ref 8.6–12.4)
PLATELETS: 203 10*3/uL (ref 150–400)
RBC: 3.32 MIL/uL — ABNORMAL LOW (ref 3.87–5.11)
RDW: 13.4 % (ref 11.5–15.5)
WBC: 8.3 10*3/uL (ref 4.0–10.5)

## 2015-04-13 NOTE — Progress Notes (Signed)
Patient was diagnosed with DM at 25 yo and treated with insulin until 25 yo when her pancreas suddenly started producing insulin and she did not need any further medications.  However, she was on glyburide with her first pregnancy, but not the second. Reports that she never had GTTs done during her previous pregnancies. Only received antenatal testing during first pregnancy. Nml A1C at first visit 5.5 (12/14/2014).  She was checking her blood sugars three times a day, but not in last month because her meter broke.  She is hesitant to get another meter and resume checking, wants to undergo testing for GDM. 1 hr GTT, HgA1C, TSH, third trimester labs to be checked today, will follow up results and manage accordingly. Tdap to be given today. Undecided about BTS, has not gotten Medicaid yet and hence cannot sign papers. No other complaints or concerns.  Preterm labor and fetal movement precautions reviewed.

## 2015-04-13 NOTE — Patient Instructions (Signed)
Return to clinic for any obstetric concerns or go to [facility] for evaluation  

## 2015-04-14 LAB — GLUCOSE TOLERANCE, 1 HOUR (50G) W/O FASTING: Glucose, 1 Hour GTT: 100 mg/dL (ref 70–140)

## 2015-04-14 LAB — HIV ANTIBODY (ROUTINE TESTING W REFLEX): HIV 1&2 Ab, 4th Generation: NONREACTIVE

## 2015-04-14 LAB — HEMOGLOBIN A1C
Hgb A1c MFr Bld: 5.4 % (ref <5.7)
MEAN PLASMA GLUCOSE: 108 mg/dL (ref <117)

## 2015-04-14 LAB — TSH: TSH: 1.064 u[IU]/mL (ref 0.350–4.500)

## 2015-04-14 LAB — RPR

## 2015-05-04 ENCOUNTER — Ambulatory Visit (INDEPENDENT_AMBULATORY_CARE_PROVIDER_SITE_OTHER): Payer: Medicaid Other | Admitting: Family Medicine

## 2015-05-04 ENCOUNTER — Encounter: Payer: Self-pay | Admitting: Family Medicine

## 2015-05-04 VITALS — BP 97/62 | HR 76 | Wt 119.6 lb

## 2015-05-04 DIAGNOSIS — O34219 Maternal care for unspecified type scar from previous cesarean delivery: Secondary | ICD-10-CM

## 2015-05-04 DIAGNOSIS — F3130 Bipolar disorder, current episode depressed, mild or moderate severity, unspecified: Secondary | ICD-10-CM

## 2015-05-04 DIAGNOSIS — F319 Bipolar disorder, unspecified: Secondary | ICD-10-CM

## 2015-05-04 DIAGNOSIS — Z3493 Encounter for supervision of normal pregnancy, unspecified, third trimester: Secondary | ICD-10-CM

## 2015-05-04 DIAGNOSIS — O3421 Maternal care for scar from previous cesarean delivery: Secondary | ICD-10-CM

## 2015-05-04 MED ORDER — ALPRAZOLAM 0.5 MG PO TABS: 0.5000 mg | ORAL_TABLET | Freq: Two times a day (BID) | ORAL | Status: DC | PRN

## 2015-05-04 NOTE — Patient Instructions (Signed)
Third Trimester of Pregnancy The third trimester is from week 29 through week 42, months 7 through 9. The third trimester is a time when the fetus is growing rapidly. At the end of the ninth month, the fetus is about 20 inches in length and weighs 6-10 pounds.  BODY CHANGES Your body goes through many changes during pregnancy. The changes vary from woman to woman.   Your weight will continue to increase. You can expect to gain 25-35 pounds (11-16 kg) by the end of the pregnancy.  You may begin to get stretch marks on your hips, abdomen, and breasts.  You may urinate more often because the fetus is moving lower into your pelvis and pressing on your bladder.  You may develop or continue to have heartburn as a result of your pregnancy.  You may develop constipation because certain hormones are causing the muscles that push waste through your intestines to slow down.  You may develop hemorrhoids or swollen, bulging veins (varicose veins).  You may have pelvic pain because of the weight gain and pregnancy hormones relaxing your joints between the bones in your pelvis. Backaches may result from overexertion of the muscles supporting your posture.  You may have changes in your hair. These can include thickening of your hair, rapid growth, and changes in texture. Some women also have hair loss during or after pregnancy, or hair that feels dry or thin. Your hair will most likely return to normal after your baby is born.  Your breasts will continue to grow and be tender. A yellow discharge may leak from your breasts called colostrum.  Your belly button may stick out.  You may feel short of breath because of your expanding uterus.  You may notice the fetus "dropping," or moving lower in your abdomen.  You may have a bloody mucus discharge. This usually occurs a few days to a week before labor begins.  Your cervix becomes thin and soft (effaced) near your due date. WHAT TO EXPECT AT YOUR  PRENATAL EXAMS  You will have prenatal exams every 2 weeks until week 36. Then, you will have weekly prenatal exams. During a routine prenatal visit:  You will be weighed to make sure you and the fetus are growing normally.  Your blood pressure is taken.  Your abdomen will be measured to track your baby's growth.  The fetal heartbeat will be listened to.  Any test results from the previous visit will be discussed.  You may have a cervical check near your due date to see if you have effaced. At around 36 weeks, your caregiver will check your cervix. At the same time, your caregiver will also perform a test on the secretions of the vaginal tissue. This test is to determine if a type of bacteria, Group B streptococcus, is present. Your caregiver will explain this further. Your caregiver may ask you:  What your birth plan is.  How you are feeling.  If you are feeling the baby move.  If you have had any abnormal symptoms, such as leaking fluid, bleeding, severe headaches, or abdominal cramping.  If you have any questions. Other tests or screenings that may be performed during your third trimester include:  Blood tests that check for low iron levels (anemia).  Fetal testing to check the health, activity level, and growth of the fetus. Testing is done if you have certain medical conditions or if there are problems during the pregnancy. FALSE LABOR You may feel small, irregular contractions that   eventually go away. These are called Braxton Hicks contractions, or false labor. Contractions may last for hours, days, or even weeks before true labor sets in. If contractions come at regular intervals, intensify, or become painful, it is best to be seen by your caregiver.  SIGNS OF LABOR   Menstrual-like cramps.  Contractions that are 5 minutes apart or less.  Contractions that start on the top of the uterus and spread down to the lower abdomen and back.  A sense of increased pelvic  pressure or back pain.  A watery or bloody mucus discharge that comes from the vagina. If you have any of these signs before the 37th week of pregnancy, call your caregiver right away. You need to go to the hospital to get checked immediately. HOME CARE INSTRUCTIONS   Avoid all smoking, herbs, alcohol, and unprescribed drugs. These chemicals affect the formation and growth of the baby.  Follow your caregiver's instructions regarding medicine use. There are medicines that are either safe or unsafe to take during pregnancy.  Exercise only as directed by your caregiver. Experiencing uterine cramps is a good sign to stop exercising.  Continue to eat regular, healthy meals.  Wear a good support bra for breast tenderness.  Do not use hot tubs, steam rooms, or saunas.  Wear your seat belt at all times when driving.  Avoid raw meat, uncooked cheese, cat litter boxes, and soil used by cats. These carry germs that can cause birth defects in the baby.  Take your prenatal vitamins.  Try taking a stool softener (if your caregiver approves) if you develop constipation. Eat more high-fiber foods, such as fresh vegetables or fruit and whole grains. Drink plenty of fluids to keep your urine clear or pale yellow.  Take warm sitz baths to soothe any pain or discomfort caused by hemorrhoids. Use hemorrhoid cream if your caregiver approves.  If you develop varicose veins, wear support hose. Elevate your feet for 15 minutes, 3-4 times a day. Limit salt in your diet.  Avoid heavy lifting, wear low heal shoes, and practice good posture.  Rest a lot with your legs elevated if you have leg cramps or low back pain.  Visit your dentist if you have not gone during your pregnancy. Use a soft toothbrush to brush your teeth and be gentle when you floss.  A sexual relationship may be continued unless your caregiver directs you otherwise.  Do not travel far distances unless it is absolutely necessary and only  with the approval of your caregiver.  Take prenatal classes to understand, practice, and ask questions about the labor and delivery.  Make a trial run to the hospital.  Pack your hospital bag.  Prepare the baby's nursery.  Continue to go to all your prenatal visits as directed by your caregiver. SEEK MEDICAL CARE IF:  You are unsure if you are in labor or if your water has broken.  You have dizziness.  You have mild pelvic cramps, pelvic pressure, or nagging pain in your abdominal area.  You have persistent nausea, vomiting, or diarrhea.  You have a bad smelling vaginal discharge.  You have pain with urination. SEEK IMMEDIATE MEDICAL CARE IF:   You have a fever.  You are leaking fluid from your vagina.  You have spotting or bleeding from your vagina.  You have severe abdominal cramping or pain.  You have rapid weight loss or gain.  You have shortness of breath with chest pain.  You notice sudden or extreme swelling   of your face, hands, ankles, feet, or legs.  You have not felt your baby move in over an hour.  You have severe headaches that do not go away with medicine.  You have vision changes. Document Released: 12/11/2001 Document Revised: 12/22/2013 Document Reviewed: 02/17/2013 [employer] Patient Information 2015 [employer]. This information is not intended to replace advice given to you by your health care provider. Make sure you discuss any questions you have with your health care provider.  Breastfeeding Deciding to breastfeed is one of the best choices you can make for you and your baby. A change in hormones during pregnancy causes your breast tissue to grow and increases the number and size of your milk ducts. These hormones also allow proteins, sugars, and fats from your blood supply to make breast milk in your milk-producing glands. Hormones prevent breast milk from being released before your baby is born as well as prompt milk flow after birth. Once  breastfeeding has begun, thoughts of your baby, as well as his or her sucking or crying, can stimulate the release of milk from your milk-producing glands.  BENEFITS OF BREASTFEEDING For Your Baby  Your first milk (colostrum) helps your baby's digestive system function better.   There are antibodies in your milk that help your baby fight off infections.   Your baby has a lower incidence of asthma, allergies, and sudden infant death syndrome.   The nutrients in breast milk are better for your baby than infant formulas and are designed uniquely for your baby's needs.   Breast milk improves your baby's brain development.   Your baby is less likely to develop other conditions, such as childhood obesity, asthma, or type 2 diabetes mellitus.  For You   Breastfeeding helps to create a very special bond between you and your baby.   Breastfeeding is convenient. Breast milk is always available at the correct temperature and costs nothing.   Breastfeeding helps to burn calories and helps you lose the weight gained during pregnancy.   Breastfeeding makes your uterus contract to its prepregnancy size faster and slows bleeding (lochia) after you give birth.   Breastfeeding helps to lower your risk of developing type 2 diabetes mellitus, osteoporosis, and breast or ovarian cancer later in life. SIGNS THAT YOUR BABY IS HUNGRY Early Signs of Hunger  Increased alertness or activity.  Stretching.  Movement of the head from side to side.  Movement of the head and opening of the mouth when the corner of the mouth or cheek is stroked (rooting).  Increased sucking sounds, smacking lips, cooing, sighing, or squeaking.  Hand-to-mouth movements.  Increased sucking of fingers or hands. Late Signs of Hunger  Fussing.  Intermittent crying. Extreme Signs of Hunger Signs of extreme hunger will require calming and consoling before your baby will be able to breastfeed successfully. Do not  wait for the following signs of extreme hunger to occur before you initiate breastfeeding:   Restlessness.  A loud, strong cry.   Screaming. BREASTFEEDING BASICS Breastfeeding Initiation  Find a comfortable place to sit or lie down, with your neck and back well supported.  Place a pillow or rolled up blanket under your baby to bring him or her to the level of your breast (if you are seated). Nursing pillows are specially designed to help support your arms and your baby while you breastfeed.  Make sure that your baby's abdomen is facing your abdomen.   Gently massage your breast. With your fingertips, massage from your chest   wall toward your nipple in a circular motion. This encourages milk flow. You may need to continue this action during the feeding if your milk flows slowly.  Support your breast with 4 fingers underneath and your thumb above your nipple. Make sure your fingers are well away from your nipple and your baby's mouth.   Stroke your baby's lips gently with your finger or nipple.   When your baby's mouth is open wide enough, quickly bring your baby to your breast, placing your entire nipple and as much of the colored area around your nipple (areola) as possible into your baby's mouth.   More areola should be visible above your baby's upper lip than below the lower lip.   Your baby's tongue should be between his or her lower gum and your breast.   Ensure that your baby's mouth is correctly positioned around your nipple (latched). Your baby's lips should create a seal on your breast and be turned out (everted).  It is common for your baby to suck about 2-3 minutes in order to start the flow of breast milk. Latching Teaching your baby how to latch on to your breast properly is very important. An improper latch can cause nipple pain and decreased milk supply for you and poor weight gain in your baby. Also, if your baby is not latched onto your nipple properly, he or she  may swallow some air during feeding. This can make your baby fussy. Burping your baby when you switch breasts during the feeding can help to get rid of the air. However, teaching your baby to latch on properly is still the best way to prevent fussiness from swallowing air while breastfeeding. Signs that your baby has successfully latched on to your nipple:    Silent tugging or silent sucking, without causing you pain.   Swallowing heard between every 3-4 sucks.    Muscle movement above and in front of his or her ears while sucking.  Signs that your baby has not successfully latched on to nipple:   Sucking sounds or smacking sounds from your baby while breastfeeding.  Nipple pain. If you think your baby has not latched on correctly, slip your finger into the corner of your baby's mouth to break the suction and place it between your baby's gums. Attempt breastfeeding initiation again. Signs of Successful Breastfeeding Signs from your baby:   A gradual decrease in the number of sucks or complete cessation of sucking.   Falling asleep.   Relaxation of his or her body.   Retention of a small amount of milk in his or her mouth.   Letting go of your breast by himself or herself. Signs from you:  Breasts that have increased in firmness, weight, and size 1-3 hours after feeding.   Breasts that are softer immediately after breastfeeding.  Increased milk volume, as well as a change in milk consistency and color by the fifth day of breastfeeding.   Nipples that are not sore, cracked, or bleeding. Signs That Your Baby is Getting Enough Milk  Wetting at least 3 diapers in a 24-hour period. The urine should be clear and pale yellow by age 5 days.  At least 3 stools in a 24-hour period by age 5 days. The stool should be soft and yellow.  At least 3 stools in a 24-hour period by age 7 days. The stool should be seedy and yellow.  No loss of weight greater than 10% of birth weight  during the first 3   days of age.  Average weight gain of 4-7 ounces (113-198 g) per week after age 4 days.  Consistent daily weight gain by age 5 days, without weight loss after the age of 2 weeks. After a feeding, your baby may spit up a small amount. This is common. BREASTFEEDING FREQUENCY AND DURATION Frequent feeding will help you make more milk and can prevent sore nipples and breast engorgement. Breastfeed when you feel the need to reduce the fullness of your breasts or when your baby shows signs of hunger. This is called "breastfeeding on demand." Avoid introducing a pacifier to your baby while you are working to establish breastfeeding (the first 4-6 weeks after your baby is born). After this time you may choose to use a pacifier. Research has shown that pacifier use during the first year of a baby's life decreases the risk of sudden infant death syndrome (SIDS). Allow your baby to feed on each breast as long as he or she wants. Breastfeed until your baby is finished feeding. When your baby unlatches or falls asleep while feeding from the first breast, offer the second breast. Because newborns are often sleepy in the first few weeks of life, you may need to awaken your baby to get him or her to feed. Breastfeeding times will vary from baby to baby. However, the following rules can serve as a guide to help you ensure that your baby is properly fed:  Newborns (babies 4 weeks of age or younger) may breastfeed every 1-3 hours.  Newborns should not go longer than 3 hours during the day or 5 hours during the night without breastfeeding.  You should breastfeed your baby a minimum of 8 times in a 24-hour period until you begin to introduce solid foods to your baby at around 6 months of age. BREAST MILK PUMPING Pumping and storing breast milk allows you to ensure that your baby is exclusively fed your breast milk, even at times when you are unable to breastfeed. This is especially important if you are  going back to work while you are still breastfeeding or when you are not able to be present during feedings. Your lactation consultant can give you guidelines on how long it is safe to store breast milk.  A breast pump is a machine that allows you to pump milk from your breast into a sterile bottle. The pumped breast milk can then be stored in a refrigerator or freezer. Some breast pumps are operated by hand, while others use electricity. Ask your lactation consultant which type will work best for you. Breast pumps can be purchased, but some hospitals and breastfeeding support groups lease breast pumps on a monthly basis. A lactation consultant can teach you how to hand express breast milk, if you prefer not to use a pump.  CARING FOR YOUR BREASTS WHILE YOU BREASTFEED Nipples can become dry, cracked, and sore while breastfeeding. The following recommendations can help keep your breasts moisturized and healthy:  Avoid using soap on your nipples.   Wear a supportive bra. Although not required, special nursing bras and tank tops are designed to allow access to your breasts for breastfeeding without taking off your entire bra or top. Avoid wearing underwire-style bras or extremely tight bras.  Air dry your nipples for 3-4minutes after each feeding.   Use only cotton bra pads to absorb leaked breast milk. Leaking of breast milk between feedings is normal.   Use lanolin on your nipples after breastfeeding. Lanolin helps to maintain your skin's   normal moisture barrier. If you use pure lanolin, you do not need to wash it off before feeding your baby again. Pure lanolin is not toxic to your baby. You may also hand express a few drops of breast milk and gently massage that milk into your nipples and allow the milk to air dry. In the first few weeks after giving birth, some women experience extremely full breasts (engorgement). Engorgement can make your breasts feel heavy, warm, and tender to the touch.  Engorgement peaks within 3-5 days after you give birth. The following recommendations can help ease engorgement:  Completely empty your breasts while breastfeeding or pumping. You may want to start by applying warm, moist heat (in the shower or with warm water-soaked hand towels) just before feeding or pumping. This increases circulation and helps the milk flow. If your baby does not completely empty your breasts while breastfeeding, pump any extra milk after he or she is finished.  Wear a snug bra (nursing or regular) or tank top for 1-2 days to signal your body to slightly decrease milk production.  Apply ice packs to your breasts, unless this is too uncomfortable for you.  Make sure that your baby is latched on and positioned properly while breastfeeding. If engorgement persists after 48 hours of following these recommendations, contact your health care provider or a lactation consultant. OVERALL HEALTH CARE RECOMMENDATIONS WHILE BREASTFEEDING  Eat healthy foods. Alternate between meals and snacks, eating 3 of each per day. Because what you eat affects your breast milk, some of the foods may make your baby more irritable than usual. Avoid eating these foods if you are sure that they are negatively affecting your baby.  Drink milk, fruit juice, and water to satisfy your thirst (about 10 glasses a day).   Rest often, relax, and continue to take your prenatal vitamins to prevent fatigue, stress, and anemia.  Continue breast self-awareness checks.  Avoid chewing and smoking tobacco.  Avoid alcohol and drug use. Some medicines that may be harmful to your baby can pass through breast milk. It is important to ask your health care provider before taking any medicine, including all over-the-counter and prescription medicine as well as vitamin and herbal supplements. It is possible to become pregnant while breastfeeding. If birth control is desired, ask your health care provider about options that  will be safe for your baby. SEEK MEDICAL CARE IF:   You feel like you want to stop breastfeeding or have become frustrated with breastfeeding.  You have painful breasts or nipples.  Your nipples are cracked or bleeding.  Your breasts are red, tender, or warm.  You have a swollen area on either breast.  You have a fever or chills.  You have nausea or vomiting.  You have drainage other than breast milk from your nipples.  Your breasts do not become full before feedings by the fifth day after you give birth.  You feel sad and depressed.  Your baby is too sleepy to eat well.  Your baby is having trouble sleeping.   Your baby is wetting less than 3 diapers in a 24-hour period.  Your baby has less than 3 stools in a 24-hour period.  Your baby's skin or the white part of his or her eyes becomes yellow.   Your baby is not gaining weight by 5 days of age. SEEK IMMEDIATE MEDICAL CARE IF:   Your baby is overly tired (lethargic) and does not want to wake up and feed.  Your baby   develops an unexplained fever. Document Released: 12/17/2005 Document Revised: 12/22/2013 Document Reviewed: 06/10/2013 [employer] Patient Information 2015 [employer]. This information is not intended to replace advice given to you by your health care provider. Make sure you discuss any questions you have with your health care provider.  

## 2015-05-04 NOTE — Progress Notes (Signed)
Normal labs and normal f/u anatomy. Has decided against BTL--desires IUD Scheduled for [facility]CS today Has been unable to get Medicaid worked out and cannot see psychiatry--needs refill on xanax--given today

## 2015-05-18 ENCOUNTER — Ambulatory Visit (INDEPENDENT_AMBULATORY_CARE_PROVIDER_SITE_OTHER): Payer: Medicaid Other | Admitting: Family Medicine

## 2015-05-18 VITALS — BP 116/67 | HR 81 | Wt 121.6 lb

## 2015-05-18 DIAGNOSIS — O3421 Maternal care for scar from previous cesarean delivery: Secondary | ICD-10-CM

## 2015-05-18 DIAGNOSIS — Z3493 Encounter for supervision of normal pregnancy, unspecified, third trimester: Secondary | ICD-10-CM

## 2015-05-18 DIAGNOSIS — O34219 Maternal care for unspecified type scar from previous cesarean delivery: Secondary | ICD-10-CM

## 2015-05-18 NOTE — Patient Instructions (Signed)
Breastfeeding Deciding to breastfeed is one of the best choices you can make for you and your baby. A change in hormones during pregnancy causes your breast tissue to grow and increases the number and size of your milk ducts. These hormones also allow proteins, sugars, and fats from your blood supply to make breast milk in your milk-producing glands. Hormones prevent breast milk from being released before your baby is born as well as prompt milk flow after birth. Once breastfeeding has begun, thoughts of your baby, as well as his or her sucking or crying, can stimulate the release of milk from your milk-producing glands.  BENEFITS OF BREASTFEEDING For Your Baby  Your first milk (colostrum) helps your baby's digestive system function better.   There are antibodies in your milk that help your baby fight off infections.   Your baby has a lower incidence of asthma, allergies, and sudden infant death syndrome.   The nutrients in breast milk are better for your baby than infant formulas and are designed uniquely for your baby's needs.   Breast milk improves your baby's brain development.   Your baby is less likely to develop other conditions, such as childhood obesity, asthma, or type 2 diabetes mellitus.  For You   Breastfeeding helps to create a very special bond between you and your baby.   Breastfeeding is convenient. Breast milk is always available at the correct temperature and costs nothing.   Breastfeeding helps to burn calories and helps you lose the weight gained during pregnancy.   Breastfeeding makes your uterus contract to its prepregnancy size faster and slows bleeding (lochia) after you give birth.   Breastfeeding helps to lower your risk of developing type 2 diabetes mellitus, osteoporosis, and breast or ovarian cancer later in life. SIGNS THAT YOUR BABY IS HUNGRY Early Signs of Hunger  Increased alertness or activity.  Stretching.  Movement of the head from  side to side.  Movement of the head and opening of the mouth when the corner of the mouth or cheek is stroked (rooting).  Increased sucking sounds, smacking lips, cooing, sighing, or squeaking.  Hand-to-mouth movements.  Increased sucking of fingers or hands. Late Signs of Hunger  Fussing.  Intermittent crying. Extreme Signs of Hunger Signs of extreme hunger will require calming and consoling before your baby will be able to breastfeed successfully. Do not wait for the following signs of extreme hunger to occur before you initiate breastfeeding:   Restlessness.  A loud, strong cry.   Screaming. BREASTFEEDING BASICS Breastfeeding Initiation  Find a comfortable place to sit or lie down, with your neck and back well supported.  Place a pillow or rolled up blanket under your baby to bring him or her to the level of your breast (if you are seated). Nursing pillows are specially designed to help support your arms and your baby while you breastfeed.  Make sure that your baby's abdomen is facing your abdomen.   Gently massage your breast. With your fingertips, massage from your chest wall toward your nipple in a circular motion. This encourages milk flow. You may need to continue this action during the feeding if your milk flows slowly.  Support your breast with 4 fingers underneath and your thumb above your nipple. Make sure your fingers are well away from your nipple and your baby's mouth.   Stroke your baby's lips gently with your finger or nipple.   When your baby's mouth is open wide enough, quickly bring your baby to your   breast, placing your entire nipple and as much of the colored area around your nipple (areola) as possible into your baby's mouth.   More areola should be visible above your baby's upper lip than below the lower lip.   Your baby's tongue should be between his or her lower gum and your breast.   Ensure that your baby's mouth is correctly positioned  around your nipple (latched). Your baby's lips should create a seal on your breast and be turned out (everted).  It is common for your baby to suck about 2-3 minutes in order to start the flow of breast milk. Latching Teaching your baby how to latch on to your breast properly is very important. An improper latch can cause nipple pain and decreased milk supply for you and poor weight gain in your baby. Also, if your baby is not latched onto your nipple properly, he or she may swallow some air during feeding. This can make your baby fussy. Burping your baby when you switch breasts during the feeding can help to get rid of the air. However, teaching your baby to latch on properly is still the best way to prevent fussiness from swallowing air while breastfeeding. Signs that your baby has successfully latched on to your nipple:    Silent tugging or silent sucking, without causing you pain.   Swallowing heard between every 3-4 sucks.    Muscle movement above and in front of his or her ears while sucking.  Signs that your baby has not successfully latched on to nipple:   Sucking sounds or smacking sounds from your baby while breastfeeding.  Nipple pain. If you think your baby has not latched on correctly, slip your finger into the corner of your baby's mouth to break the suction and place it between your baby's gums. Attempt breastfeeding initiation again. Signs of Successful Breastfeeding Signs from your baby:   A gradual decrease in the number of sucks or complete cessation of sucking.   Falling asleep.   Relaxation of his or her body.   Retention of a small amount of milk in his or her mouth.   Letting go of your breast by himself or herself. Signs from you:  Breasts that have increased in firmness, weight, and size 1-3 hours after feeding.   Breasts that are softer immediately after breastfeeding.  Increased milk volume, as well as a change in milk consistency and color by  the fifth day of breastfeeding.   Nipples that are not sore, cracked, or bleeding. Signs That Your Baby is Getting Enough Milk  Wetting at least 3 diapers in a 24-hour period. The urine should be clear and pale yellow by age 5 days.  At least 3 stools in a 24-hour period by age 5 days. The stool should be soft and yellow.  At least 3 stools in a 24-hour period by age 7 days. The stool should be seedy and yellow.  No loss of weight greater than 10% of birth weight during the first 3 days of age.  Average weight gain of 4-7 ounces (113-198 g) per week after age 4 days.  Consistent daily weight gain by age 5 days, without weight loss after the age of 2 weeks. After a feeding, your baby may spit up a small amount. This is common. BREASTFEEDING FREQUENCY AND DURATION Frequent feeding will help you make more milk and can prevent sore nipples and breast engorgement. Breastfeed when you feel the need to reduce the fullness of your breasts   or when your baby shows signs of hunger. This is called "breastfeeding on demand." Avoid introducing a pacifier to your baby while you are working to establish breastfeeding (the first 4-6 weeks after your baby is born). After this time you may choose to use a pacifier. Research has shown that pacifier use during the first year of a baby's life decreases the risk of sudden infant death syndrome (SIDS). Allow your baby to feed on each breast as long as he or she wants. Breastfeed until your baby is finished feeding. When your baby unlatches or falls asleep while feeding from the first breast, offer the second breast. Because newborns are often sleepy in the first few weeks of life, you may need to awaken your baby to get him or her to feed. Breastfeeding times will vary from baby to baby. However, the following rules can serve as a guide to help you ensure that your baby is properly fed:  Newborns (babies 4 weeks of age or younger) may breastfeed every 1-3  hours.  Newborns should not go longer than 3 hours during the day or 5 hours during the night without breastfeeding.  You should breastfeed your baby a minimum of 8 times in a 24-hour period until you begin to introduce solid foods to your baby at around 6 months of age. BREAST MILK PUMPING Pumping and storing breast milk allows you to ensure that your baby is exclusively fed your breast milk, even at times when you are unable to breastfeed. This is especially important if you are going back to work while you are still breastfeeding or when you are not able to be present during feedings. Your lactation consultant can give you guidelines on how long it is safe to store breast milk.  A breast pump is a machine that allows you to pump milk from your breast into a sterile bottle. The pumped breast milk can then be stored in a refrigerator or freezer. Some breast pumps are operated by hand, while others use electricity. Ask your lactation consultant which type will work best for you. Breast pumps can be purchased, but some hospitals and breastfeeding support groups lease breast pumps on a monthly basis. A lactation consultant can teach you how to hand express breast milk, if you prefer not to use a pump.  CARING FOR YOUR BREASTS WHILE YOU BREASTFEED Nipples can become dry, cracked, and sore while breastfeeding. The following recommendations can help keep your breasts moisturized and healthy:  Avoid using soap on your nipples.   Wear a supportive bra. Although not required, special nursing bras and tank tops are designed to allow access to your breasts for breastfeeding without taking off your entire bra or top. Avoid wearing underwire-style bras or extremely tight bras.  Air dry your nipples for 3-4minutes after each feeding.   Use only cotton bra pads to absorb leaked breast milk. Leaking of breast milk between feedings is normal.   Use lanolin on your nipples after breastfeeding. Lanolin helps to  maintain your skin's normal moisture barrier. If you use pure lanolin, you do not need to wash it off before feeding your baby again. Pure lanolin is not toxic to your baby. You may also hand express a few drops of breast milk and gently massage that milk into your nipples and allow the milk to air dry. In the first few weeks after giving birth, some women experience extremely full breasts (engorgement). Engorgement can make your breasts feel heavy, warm, and tender to the   touch. Engorgement peaks within 3-5 days after you give birth. The following recommendations can help ease engorgement:  Completely empty your breasts while breastfeeding or pumping. You may want to start by applying warm, moist heat (in the shower or with warm water-soaked hand towels) just before feeding or pumping. This increases circulation and helps the milk flow. If your baby does not completely empty your breasts while breastfeeding, pump any extra milk after he or she is finished.  Wear a snug bra (nursing or regular) or tank top for 1-2 days to signal your body to slightly decrease milk production.  Apply ice packs to your breasts, unless this is too uncomfortable for you.  Make sure that your baby is latched on and positioned properly while breastfeeding. If engorgement persists after 48 hours of following these recommendations, contact your health care provider or a lactation consultant. OVERALL HEALTH CARE RECOMMENDATIONS WHILE BREASTFEEDING  Eat healthy foods. Alternate between meals and snacks, eating 3 of each per day. Because what you eat affects your breast milk, some of the foods may make your baby more irritable than usual. Avoid eating these foods if you are sure that they are negatively affecting your baby.  Drink milk, fruit juice, and water to satisfy your thirst (about 10 glasses a day).   Rest often, relax, and continue to take your prenatal vitamins to prevent fatigue, stress, and anemia.  Continue  breast self-awareness checks.  Avoid chewing and smoking tobacco.  Avoid alcohol and drug use. Some medicines that may be harmful to your baby can pass through breast milk. It is important to ask your health care provider before taking any medicine, including all over-the-counter and prescription medicine as well as vitamin and herbal supplements. It is possible to become pregnant while breastfeeding. If birth control is desired, ask your health care provider about options that will be safe for your baby. SEEK MEDICAL CARE IF:   You feel like you want to stop breastfeeding or have become frustrated with breastfeeding.  You have painful breasts or nipples.  Your nipples are cracked or bleeding.  Your breasts are red, tender, or warm.  You have a swollen area on either breast.  You have a fever or chills.  You have nausea or vomiting.  You have drainage other than breast milk from your nipples.  Your breasts do not become full before feedings by the fifth day after you give birth.  You feel sad and depressed.  Your baby is too sleepy to eat well.  Your baby is having trouble sleeping.   Your baby is wetting less than 3 diapers in a 24-hour period.  Your baby has less than 3 stools in a 24-hour period.  Your baby's skin or the white part of his or her eyes becomes yellow.   Your baby is not gaining weight by 5 days of age. SEEK IMMEDIATE MEDICAL CARE IF:   Your baby is overly tired (lethargic) and does not want to wake up and feed.  Your baby develops an unexplained fever. Document Released: 12/17/2005 Document Revised: 12/22/2013 Document Reviewed: 06/10/2013 [employer] Patient Information 2015 [employer]. This information is not intended to replace advice given to you by your health care provider. Make sure you discuss any questions you have with your health care provider.  

## 2015-05-18 NOTE — Progress Notes (Signed)
Reports some back pain-->belly pregnancy band C-section scheduled.

## 2015-05-20 ENCOUNTER — Encounter (HOSPITAL_COMMUNITY): Payer: Self-pay | Admitting: *Deleted

## 2015-05-20 ENCOUNTER — Inpatient Hospital Stay (HOSPITAL_COMMUNITY)
Admission: AD | Admit: 2015-05-20 | Discharge: 2015-05-21 | Disposition: A | Discharge status: Home / Self Care | Payer: Medicaid Other | Source: Ambulatory Visit | Attending: Family Medicine | Admitting: Family Medicine

## 2015-05-20 DIAGNOSIS — Z3A32 32 weeks gestation of pregnancy: Secondary | ICD-10-CM | POA: Diagnosis not present

## 2015-05-20 DIAGNOSIS — R300 Dysuria: Secondary | ICD-10-CM | POA: Diagnosis not present

## 2015-05-20 DIAGNOSIS — O99333 Smoking (tobacco) complicating pregnancy, third trimester: Secondary | ICD-10-CM | POA: Diagnosis not present

## 2015-05-20 DIAGNOSIS — R102 Pelvic and perineal pain: Secondary | ICD-10-CM | POA: Diagnosis present

## 2015-05-20 DIAGNOSIS — F17210 Nicotine dependence, cigarettes, uncomplicated: Secondary | ICD-10-CM | POA: Diagnosis not present

## 2015-05-20 LAB — URINALYSIS, ROUTINE W REFLEX MICROSCOPIC
BILIRUBIN URINE: NEGATIVE
Glucose, UA: NEGATIVE mg/dL
KETONES UR: NEGATIVE mg/dL
Nitrite: NEGATIVE
Protein, ur: NEGATIVE mg/dL
SPECIFIC GRAVITY, URINE: 1.01 (ref 1.005–1.030)
Urobilinogen, UA: 0.2 mg/dL (ref 0.0–1.0)
pH: 6.5 (ref 5.0–8.0)

## 2015-05-20 LAB — URINE MICROSCOPIC-ADD ON

## 2015-05-20 LAB — FETAL FIBRONECTIN: FETAL FIBRONECTIN: NEGATIVE

## 2015-05-20 MED ORDER — SODIUM CHLORIDE 0.9 % IV SOLN
INTRAVENOUS | Status: DC
  Administered 2015-05-20: 23:00:00 via INTRAVENOUS

## 2015-05-20 MED ORDER — CEFTRIAXONE SODIUM IN DEXTROSE 20 MG/ML IV SOLN
1.0000 g | Freq: Once | INTRAVENOUS | Status: AC
  Administered 2015-05-20: 1 g via INTRAVENOUS
  Filled 2015-05-20: qty 50

## 2015-05-20 MED ORDER — NIFEDIPINE 10 MG PO CAPS
10.0000 mg | ORAL_CAPSULE | ORAL | Status: AC | PRN
  Administered 2015-05-20 (×4): 10 mg via ORAL
  Filled 2015-05-20 (×4): qty 1

## 2015-05-20 MED ORDER — TERBUTALINE SULFATE 1 MG/ML IJ SOLN
0.2500 mg | Freq: Once | INTRAMUSCULAR | Status: AC
  Administered 2015-05-20: 0.25 mg via SUBCUTANEOUS
  Filled 2015-05-20: qty 1

## 2015-05-20 NOTE — MAU Note (Signed)
Pt reports she stared having increased vaginal an pelvic pain and pressure today. Reports good fetal movement. Pt stated she is having difficulty urinating.

## 2015-05-20 NOTE — MAU Provider Note (Signed)
History     CSN: [id number]  Arrival date and time: 05/20/15 1927   None     Chief Complaint  Patient presents with  . Pelvic Pain   Abdominal Pain This is a new problem. The current episode started today. The onset quality is gradual. The problem occurs intermittently. The problem has been unchanged. The pain is located in the suprapubic region, RLQ and LLQ. The pain is mild. The quality of the pain is cramping. The abdominal pain does not radiate. Associated symptoms include dysuria. Pertinent negatives include no arthralgias, constipation, diarrhea, fever, headaches, myalgias, nausea or vomiting. Nothing aggravates the pain. The pain is relieved by nothing. She has tried nothing for the symptoms.   This is a 25 y.o. female at 5938w6d  Who presents with c/o vaginal and pelvic pain. States it started earlier today and is somewhat associated with contractions. Has also had some dysuria and difficulty with urination. Denies bleeding. Reports good fetal movement.   RN Note:  Expand All Collapse All   Pt reports she stared having increased vaginal an pelvic pain and pressure today. Reports good fetal movement. Pt stated she is having difficulty urinating.           OB History    Gravida Para Term Preterm AB TAB SAB Ectopic Multiple Living   4 2 2  1  1   2       Past Medical History  Diagnosis Date  . Chest pain   . SOB (shortness of breath)   . Depression   . Gestational diabetes   . Fibromyalgia   . Anemia   . Ulcer   . Rectal bleeding     Past Surgical History  Procedure Laterality Date  . Cesarean section      Family History  Problem Relation Age of Onset  . Depression Mother   . Fibromyalgia Mother   . Diabetes Maternal Grandmother   . Hypertension Maternal Grandmother   . Arthritis Maternal Grandmother   . Thyroid disease Maternal Grandmother     History  Substance Use Topics  . Smoking status: Current Every Day Smoker -- 0.25 packs/day    Types:  Cigarettes  . Smokeless tobacco: Never Used  . Alcohol Use: No    Allergies: No Known Allergies  Prescriptions prior to admission  Medication Sig Dispense Refill Last Dose  . ALPRAZolam (XANAX) 0.5 MG tablet Take 1 tablet (0.5 mg total) by mouth 2 (two) times daily as needed for anxiety. 60 tablet 1 Past Week at Unknown time  . aspirin 81 MG chewable tablet Chew 1 tablet (81 mg total) by mouth daily. 30 tablet 5 05/19/2015 at Unknown time  . calcium carbonate (TUMS - DOSED IN MG ELEMENTAL CALCIUM) 500 MG chewable tablet Chew 2 tablets by mouth 2 (two) times daily as needed for indigestion or heartburn.   05/20/2015 at Unknown time  . Prenatal Vit-Fe Fumarate-FA (PRENATAL VITAMIN) 27-0.8 MG TABS Take 1 tablet by mouth every morning. 30 tablet 0 05/19/2015 at Unknown time  . albuterol (PROVENTIL HFA;VENTOLIN HFA) 108 (90 BASE) MCG/ACT inhaler Inhale 2 puffs into the lungs every 6 (six) hours as needed for wheezing or shortness of breath. 18 g 5 Rescue    Review of Systems  Constitutional: Negative for fever.  Gastrointestinal: Positive for abdominal pain. Negative for nausea, vomiting, diarrhea and constipation.  Genitourinary: Positive for dysuria.  Musculoskeletal: Negative for myalgias and arthralgias.  Neurological: Negative for headaches.   Physical Exam   Blood pressure  128/76, pulse 92, temperature 98.1 F (36.7 C), resp. rate 18, height 5\' 8"  (1.727 m), last menstrual period 10/03/2014.  Physical Exam  Constitutional: She is oriented to person, place, and time. She appears well-developed and well-nourished. No distress.  HENT:  Head: Normocephalic.  Neck: Normal range of motion. Neck supple.  Cardiovascular: Normal rate and regular rhythm.  Exam reveals no gallop and no friction rub.   No murmur heard. Respiratory: Effort normal. No respiratory distress. She has no wheezes. She has no rales.  GI: Soft. She exhibits no distension. There is no tenderness. There is no rebound and  no guarding.  Fetal heart rate reactive Uterine contractions every 3 min  Genitourinary: Vagina normal. No vaginal discharge found.  Dilation: Closed Effacement (%): 50 Cervical Position: Middle Station: Ballotable Exam by:: K.WIlson,RN   Musculoskeletal: Normal range of motion.  Neurological: She is alert and oriented to person, place, and time.  Skin: Skin is warm and dry.  Psychiatric: She has a normal mood and affect.    MAU Course  Procedures  MDM Fetal fibronectin sent to determine probability of future recurrent preterm labor IV fluids given after 4 doses of Procardia were ineffective in stopping preterm contractions Contractions persisted even after above interventions Terbuataline given per consultation with Dr Adrian BlackwaterStinson Contractions stopped with terbutaline.  Assessment and Plan  A:  SIUP at [id number]       Preterm labor      Resolution of contractions with third intervention/drug       Probable urinary tract infection  P:  Consulted Dr Adrian BlackwaterStinson       Discharge home       Given one dose of Rocephin for UTI       Rx Duracef x 7 days       Reviewed need to maintain PO hydration       Follow up in office for prenatal visit         [facility] HospitalWILLIAMS,Nikolaos Maddocks 05/20/2015, 8:41 PM

## 2015-05-21 DIAGNOSIS — Z3A32 32 weeks gestation of pregnancy: Secondary | ICD-10-CM

## 2015-05-21 MED ORDER — CEFADROXIL 500 MG PO CAPS: 500.0000 mg | ORAL_CAPSULE | Freq: Two times a day (BID) | ORAL | Status: DC

## 2015-05-21 NOTE — Discharge Instructions (Signed)

## 2015-05-23 LAB — CULTURE, OB URINE
Colony Count: 70000
SPECIAL REQUESTS: NORMAL

## 2015-05-28 ENCOUNTER — Encounter (HOSPITAL_COMMUNITY): Payer: Self-pay | Admitting: Advanced Practice Midwife

## 2015-05-28 DIAGNOSIS — N390 Urinary tract infection, site not specified: Secondary | ICD-10-CM | POA: Insufficient documentation

## 2015-06-01 ENCOUNTER — Encounter: Payer: Self-pay | Admitting: Obstetrics & Gynecology

## 2015-06-01 ENCOUNTER — Ambulatory Visit (INDEPENDENT_AMBULATORY_CARE_PROVIDER_SITE_OTHER): Payer: Medicaid Other | Admitting: Obstetrics & Gynecology

## 2015-06-01 VITALS — BP 118/69 | HR 68 | Wt 124.0 lb

## 2015-06-01 DIAGNOSIS — Z3493 Encounter for supervision of normal pregnancy, unspecified, third trimester: Secondary | ICD-10-CM

## 2015-06-01 NOTE — Progress Notes (Signed)
Routine visit. Baby moves "all the time". No problems. Cultures at next visit.

## 2015-06-14 ENCOUNTER — Encounter (HOSPITAL_COMMUNITY): Payer: Self-pay | Admitting: *Deleted

## 2015-06-14 ENCOUNTER — Inpatient Hospital Stay (HOSPITAL_COMMUNITY)
Admission: AD | Admit: 2015-06-14 | Discharge: 2015-06-15 | Disposition: A | Discharge status: Home / Self Care | Payer: Medicaid Other | Source: Ambulatory Visit | Attending: Family Medicine | Admitting: Family Medicine

## 2015-06-14 ENCOUNTER — Inpatient Hospital Stay (HOSPITAL_COMMUNITY)
Admission: AD | Admit: 2015-06-14 | Discharge: 2015-06-14 | Disposition: A | Discharge status: Home / Self Care | Payer: Medicaid Other | Source: Ambulatory Visit | Attending: Family Medicine | Admitting: Family Medicine

## 2015-06-14 ENCOUNTER — Encounter (HOSPITAL_COMMUNITY): Payer: Self-pay

## 2015-06-14 DIAGNOSIS — Z3493 Encounter for supervision of normal pregnancy, unspecified, third trimester: Secondary | ICD-10-CM | POA: Diagnosis present

## 2015-06-14 DIAGNOSIS — O479 False labor, unspecified: Secondary | ICD-10-CM

## 2015-06-14 DIAGNOSIS — B3731 Acute candidiasis of vulva and vagina: Secondary | ICD-10-CM

## 2015-06-14 DIAGNOSIS — B373 Candidiasis of vulva and vagina: Secondary | ICD-10-CM

## 2015-06-14 DIAGNOSIS — J4540 Moderate persistent asthma, uncomplicated: Secondary | ICD-10-CM

## 2015-06-14 DIAGNOSIS — Z3A37 37 weeks gestation of pregnancy: Secondary | ICD-10-CM | POA: Insufficient documentation

## 2015-06-14 DIAGNOSIS — O34219 Maternal care for unspecified type scar from previous cesarean delivery: Secondary | ICD-10-CM

## 2015-06-14 DIAGNOSIS — O99332 Smoking (tobacco) complicating pregnancy, second trimester: Secondary | ICD-10-CM

## 2015-06-14 DIAGNOSIS — O09292 Supervision of pregnancy with other poor reproductive or obstetric history, second trimester: Secondary | ICD-10-CM

## 2015-06-14 LAB — AMNISURE RUPTURE OF MEMBRANE (ROM) NOT AT ARMC: AMNISURE: NEGATIVE

## 2015-06-14 LAB — WET PREP, GENITAL
Clue Cells Wet Prep HPF POC: NONE SEEN
TRICH WET PREP: NONE SEEN

## 2015-06-14 MED ORDER — FLUCONAZOLE 150 MG PO TABS: 150.0000 mg | ORAL_TABLET | Freq: Once | ORAL | Status: DC

## 2015-06-14 NOTE — MAU Note (Signed)
Pt presents complaining of contractions that are constant all weekend. Worse today. Denies vaginal bleeding or leaking of fluid. Increase in mucous discharge. Reports a decrease in fetal movement than normal but baby is still moving.

## 2015-06-14 NOTE — Discharge Instructions (Signed)
Candida Infection A Candida infection (also called yeast, fungus, and Monilia infection) is an overgrowth of yeast that can occur anywhere on the body. A yeast infection commonly occurs in warm, moist body areas. Usually, the infection remains localized but can spread to become a systemic infection. A yeast infection may be a sign of a more severe disease such as diabetes, leukemia, or AIDS. A yeast infection can occur in both men and women. In women, Candida vaginitis is a vaginal infection. It is one of the most common causes of vaginitis. Men usually do not have symptoms or know they have an infection until other problems develop. Men may find out they have a yeast infection because their sex partner has a yeast infection. Uncircumcised men are more likely to get a yeast infection than circumcised men. This is because the uncircumcised glans is not exposed to air and does not remain as dry as that of a circumcised glans. Older adults may develop yeast infections around dentures. CAUSES  Women  Antibiotics.  Steroid medication taken for a long time.  Being overweight (obese).  Diabetes.  Poor immune condition.  Certain serious medical conditions.  Immune suppressive medications for organ transplant patients.  Chemotherapy.  Pregnancy.  Menstruation.  Stress and fatigue.  Intravenous drug use.  Oral contraceptives.  Wearing tight-fitting clothes in the crotch area.  Catching it from a sex partner who has a yeast infection.  Spermicide.  Intravenous, urinary, or other catheters. Men  Catching it from a sex partner who has a yeast infection.  Having oral or anal sex with a person who has the infection.  Spermicide.  Diabetes.  Antibiotics.  Poor immune system.  Medications that suppress the immune system.  Intravenous drug use.  Intravenous, urinary, or other catheters. SYMPTOMS  Women  Thick, white vaginal discharge.  Vaginal itching.  Redness and  swelling in and around the vagina.  Irritation of the lips of the vagina and perineum.  Blisters on the vaginal lips and perineum.  Painful sexual intercourse.  Low blood sugar (hypoglycemia).  Painful urination.  Bladder infections.  Intestinal problems such as constipation, indigestion, bad breath, bloating, increase in gas, diarrhea, or loose stools. Men  Men may develop intestinal problems such as constipation, indigestion, bad breath, bloating, increase in gas, diarrhea, or loose stools.  Dry, cracked skin on the penis with itching or discomfort.  Jock itch.  Dry, flaky skin.  Athlete's foot.  Hypoglycemia. DIAGNOSIS  Women  A history and an exam are performed.  The discharge may be examined under a microscope.  A culture may be taken of the discharge. Men  A history and an exam are performed.  Any discharge from the penis or areas of cracked skin will be looked at under the microscope and cultured.  Stool samples may be cultured. TREATMENT  Women  Vaginal antifungal suppositories and creams.  Medicated creams to decrease irritation and itching on the outside of the vagina.  Warm compresses to the perineal area to decrease swelling and discomfort.  Oral antifungal medications.  Medicated vaginal suppositories or cream for repeated or recurrent infections.  Wash and dry the irritation areas before applying the cream.  Eating yogurt with Lactobacillus may help with prevention and treatment.  Sometimes painting the vagina with gentian violet solution may help if creams and suppositories do not work. Men  Antifungal creams and oral antifungal medications.  Sometimes treatment must continue for 30 days after the symptoms go away to prevent recurrence. HOME CARE INSTRUCTIONS  Women  Use cotton underwear and avoid tight-fitting clothing.  Avoid colored, scented toilet paper and deodorant tampons or pads.  Do not douche.  Keep your diabetes  under control.  Finish all the prescribed medications.  Keep your skin clean and dry.  Consume milk or yogurt with Lactobacillus-active culture regularly. If you get frequent yeast infections and think that is what the infection is, there are over-the-counter medications that you can get. If the infection does not show healing in 3 days, talk to your caregiver.  Tell your sex partner you have a yeast infection. Your partner may need treatment also, especially if your infection does not clear up or recurs. Men  Keep your skin clean and dry.  Keep your diabetes under control.  Finish all prescribed medications.  Tell your sex partner that you have a yeast infection so he or she can be treated if necessary. SEEK MEDICAL CARE IF:   Your symptoms do not clear up or worsen in one week after treatment.  You have an oral temperature above 102 F (38.9 C).  You have trouble swallowing or eating for a prolonged time.  You develop blisters on and around your vagina.  You develop vaginal bleeding and it is not your menstrual period.  You develop abdominal pain.  You develop intestinal problems as mentioned above.  You get weak or light-headed.  You have painful or increased urination.  You have pain during sexual intercourse. MAKE SURE YOU:   Understand these instructions.  Will watch your condition.  Will get help right away if you are not doing well or get worse. Document Released: 01/24/2005 Document Revised: 05/03/2014 Document Reviewed: 05/08/2010 [facility] Patient Information 2015 [place], [place]. This information is not intended to replace advice given to you by your health care provider. Make sure you discuss any questions you have with your health care provider. Fetal Movement Counts Patient Name: __________________________________________________ Patient Due Date: ____________________ Performing a fetal movement count is highly recommended in high-risk pregnancies, but  it is good for every pregnant woman to do. Your health care provider may ask you to start counting fetal movements at 28 weeks of the pregnancy. Fetal movements often increase:  After eating a full meal.  After physical activity.  After eating or drinking something sweet or cold.  At rest. Pay attention to when you feel the baby is most active. This will help you notice a pattern of your baby's sleep and wake cycles and what factors contribute to an increase in fetal movement. It is important to perform a fetal movement count at the same time each day when your baby is normally most active.  HOW TO COUNT FETAL MOVEMENTS  Find a quiet and comfortable area to sit or lie down on your left side. Lying on your left side provides the best blood and oxygen circulation to your baby.  Write down the day and time on a sheet of paper or in a journal.  Start counting kicks, flutters, swishes, rolls, or jabs in a 2-hour period. You should feel at least 10 movements within 2 hours.  If you do not feel 10 movements in 2 hours, wait 2-3 hours and count again. Look for a change in the pattern or not enough counts in 2 hours. SEEK MEDICAL CARE IF:  You feel less than 10 counts in 2 hours, tried twice.  There is no movement in over an hour.  The pattern is changing or taking longer each day to reach 10 counts  in 2 hours.  You feel the baby is not moving as he or she usually does. Date: ____________ Movements: ____________ Start time: ____________ Kim [name] time: ____________  Date: ____________ Movements: ____________ Start time: ____________ Kim [name] time: ____________ Date: ____________ Movements: ____________ Start time: ____________ Kim [name] time: ____________ Date: ____________ Movements: ____________ Start time: ____________ Kim [name] time: ____________ Date: ____________ Movements: ____________ Start time: ____________ Kim [name] time: ____________ Date: ____________ Movements: ____________ Start time:  ____________ Kim [name] time: ____________ Date: ____________ Movements: ____________ Start time: ____________ Kim [name] time: ____________ Date: ____________ Movements: ____________ Start time: ____________ Kim [name] time: ____________  Date: ____________ Movements: ____________ Start time: ____________ Kim [name] time: ____________ Date: ____________ Movements: ____________ Start time: ____________ Kim [name] time: ____________ Date: ____________ Movements: ____________ Start time: ____________ Kim [name] time: ____________ Date: ____________ Movements: ____________ Start time: ____________ Kim [name] time: ____________ Date: ____________ Movements: ____________ Start time: ____________ Kim [name] time: ____________ Date: ____________ Movements: ____________ Start time: ____________ Kim [name] time: ____________ Date: ____________ Movements: ____________ Start time: ____________ Kim [name] time: ____________  Date: ____________ Movements: ____________ Start time: ____________ Kim [name] time: ____________ Date: ____________ Movements: ____________ Start time: ____________ Kim [name] time: ____________ Date: ____________ Movements: ____________ Start time: ____________ Kim [name] time: ____________ Date: ____________ Movements: ____________ Start time: ____________ Kim [name] time: ____________ Date: ____________ Movements: ____________ Start time: ____________ Kim [name] time: ____________ Date: ____________ Movements: ____________ Start time: ____________ Kim [name] time: ____________ Date: ____________ Movements: ____________ Start time: ____________ Kim [name] time: ____________  Date: ____________ Movements: ____________ Start time: ____________ Kim [name] time: ____________ Date: ____________ Movements: ____________ Start time: ____________ Kim [name] time: ____________ Date: ____________ Movements: ____________ Start time: ____________ Kim [name] time: ____________ Date: ____________ Movements: ____________ Start time: ____________ Kim [name] time: ____________ Date:  ____________ Movements: ____________ Start time: ____________ Kim [name] time: ____________ Date: ____________ Movements: ____________ Start time: ____________ Kim [name] time: ____________ Date: ____________ Movements: ____________ Start time: ____________ Kim [name] time: ____________  Date: ____________ Movements: ____________ Start time: ____________ Kim [name] time: ____________ Date: ____________ Movements: ____________ Start time: ____________ Kim [name] time: ____________ Date: ____________ Movements: ____________ Start time: ____________ Kim [name] time: ____________ Date: ____________ Movements: ____________ Start time: ____________ Kim [name] time: ____________ Date: ____________ Movements: ____________ Start time: ____________ Kim [name] time: ____________ Date: ____________ Movements: ____________ Start time: ____________ Kim [name] time: ____________ Date: ____________ Movements: ____________ Start time: ____________ Kim [name] time: ____________  Date: ____________ Movements: ____________ Start time: ____________ Kim [name] time: ____________ Date: ____________ Movements: ____________ Start time: ____________ Kim [name] time: ____________ Date: ____________ Movements: ____________ Start time: ____________ Kim [name] time: ____________ Date: ____________ Movements: ____________ Start time: ____________ Kim [name] time: ____________ Date: ____________ Movements: ____________ Start time: ____________ Kim [name] time: ____________ Date: ____________ Movements: ____________ Start time: ____________ Kim [name] time: ____________ Date: ____________ Movements: ____________ Start time: ____________ Kim [name] time: ____________  Date: ____________ Movements: ____________ Start time: ____________ Kim [name] time: ____________ Date: ____________ Movements: ____________ Start time: ____________ Kim [name] time: ____________ Date: ____________ Movements: ____________ Start time: ____________ Kim [name] time: ____________ Date: ____________ Movements: ____________ Start  time: ____________ Kim [name] time: ____________ Date: ____________ Movements: ____________ Start time: ____________ Kim [name] time: ____________ Date: ____________ Movements: ____________ Start time: ____________ Kim [name] time: ____________ Date: ____________ Movements: ____________ Start time: ____________ Kim [name] time: ____________  Date: ____________ Movements: ____________ Start time: ____________ Kim [name] time: ____________ Date: ____________ Movements: ____________ Start time: ____________ Kim [name] time: ____________ Date: ____________ Movements: ____________ Start time: ____________ Kim [name] time: ____________ Date: ____________ Movements: ____________ Start time: ____________ Kim [name] time: ____________ Date: ____________ Movements: ____________ Start time: ____________ Kim [name] time: ____________ Date: ____________ Movements: ____________ Start time: ____________ Kim [name] time: ____________ Document Released: 01/16/2007 Document Revised: 05/03/2014 Document Reviewed: 10/13/2012 [employer] Patient Information  2015 [employer]. This information is not intended to replace advice given to you by your health care provider. Make sure you discuss any questions you have with your health care provider. ° °

## 2015-06-14 NOTE — MAU Note (Signed)
PT SAYS   SHE  SAW TRICKLE  OF  FLUID  -  AT 830-  IN TRIAGE  - NO FLUID- NO PAD.     UC  BECAME  STRONGER  AT 430PM.      VE  TODAY   IN MAU-  CLOSED-    IS  SCH  FOR  C/S  ON 7-3.-  REPEAT.     DENIES HSV AND MRSA.

## 2015-06-14 NOTE — MAU Provider Note (Signed)
History     CSN: [id number]  Arrival date and time: 06/14/15 2224   First Provider Initiated Contact with Patient 06/14/15 2311      No chief complaint on file.  HPI Patient is 25 y.o. [id number] [redacted]w[redacted]d here with complaints of contractions and leaking of fluid. Reports she has been contracting for the last four days. Was seen in MAU this AM without cervical change and discharge. Patient reports she has been having a white discharge but about four hours ago noted clear fluid leaking out. Her contractions have become more painful. + FM, no VB. She is scheduled for repeat c-section on 7/3   OB History    Gravida Para Term Preterm AB TAB SAB Ectopic Multiple Living   4 2 2  1  1   2       Past Medical History  Diagnosis Date  . Chest pain   . SOB (shortness of breath)   . Depression   . Gestational diabetes   . Fibromyalgia   . Anemia   . Ulcer   . Rectal bleeding     Past Surgical History  Procedure Laterality Date  . Cesarean section      Family History  Problem Relation Age of Onset  . Depression Mother   . Fibromyalgia Mother   . Diabetes Maternal Grandmother   . Hypertension Maternal Grandmother   . Arthritis Maternal Grandmother   . Thyroid disease Maternal Grandmother     History  Substance Use Topics  . Smoking status: Current Every Day Smoker -- 0.25 packs/day    Types: Cigarettes  . Smokeless tobacco: Never Used  . Alcohol Use: No    Allergies: No Known Allergies  Prescriptions prior to admission  Medication Sig Dispense Refill Last Dose  . acetaminophen (TYLENOL) 500 MG tablet Take 1,000 mg by mouth every 6 (six) hours as needed.   06/13/2015 at Unknown time  . albuterol (PROVENTIL HFA;VENTOLIN HFA) 108 (90 BASE) MCG/ACT inhaler Inhale 2 puffs into the lungs every 6 (six) hours as needed for wheezing or shortness of breath. 18 g 5 06/13/2015  . ALPRAZolam (XANAX) 0.5 MG tablet Take 1 tablet (0.5 mg total) by mouth 2 (two) times daily as needed for  anxiety. 60 tablet 1 Past Week at Unknown time  . aspirin 81 MG chewable tablet Chew 1 tablet (81 mg total) by mouth daily. (Patient not taking: Reported on 06/14/2015) 30 tablet 5 Taking  . calcium carbonate (TUMS - DOSED IN MG ELEMENTAL CALCIUM) 500 MG chewable tablet Chew 2 tablets by mouth 2 (two) times daily as needed for indigestion or heartburn.   06/13/2015 at Unknown time  . cefadroxil (DURICEF) 500 MG capsule Take 1 capsule (500 mg total) by mouth 2 (two) times daily. (Patient not taking: Reported on 06/14/2015) 14 capsule 0 Taking  . Prenatal Vit-Fe Fumarate-FA (PRENATAL VITAMIN) 27-0.8 MG TABS Take 1 tablet by mouth every morning. 30 tablet 0 Past Week at Unknown time    Review of Systems  Constitutional: Negative for fever and chills.  Eyes: Negative for blurred vision and double vision.  Respiratory: Negative for shortness of breath.   Cardiovascular: Negative for chest pain and leg swelling.  Gastrointestinal: Positive for nausea. Negative for heartburn, vomiting, diarrhea and constipation.  Genitourinary: Negative for dysuria and urgency.       No contractions, vaginal bleeding, itching or discharge  Skin: Negative for itching and rash.  Neurological: Negative for dizziness and headaches.   Physical Exam   Blood  pressure 128/69, pulse 87, temperature 98.5 F (36.9 C), temperature source Oral, resp. rate 20, height  (1.676 m), weight 57.607 kg (127 lb), last menstrual period 10/03/2014.  Physical Exam  Constitutional: She is oriented to person, place, and time. She appears well-developed and well-nourished.  HENT:  Head: Normocephalic and atraumatic.  Eyes: Conjunctivae are normal.  Neck: Normal range of motion.  Cardiovascular: Normal rate, regular rhythm, normal heart sounds and intact distal pulses.   No murmur heard. Respiratory: Effort normal and breath sounds normal. No respiratory distress. She has no wheezes.  GI: Soft. Bowel sounds are normal. She exhibits no  distension. There is no tenderness.  Genitourinary:  External genitalia normal. Vaginal vault with clumped white discharge, adherent to vaginal walls, no pooling, no leakage of fluid. SVE with cervix closed, long  Musculoskeletal: Normal range of motion. She exhibits no edema.  Neurological: She is alert and oriented to person, place, and time.  Skin: Skin is warm and dry.  Psychiatric: She has a normal mood and affect.  Dilation: Closed Exam by:: Glendoris Nodarse,MD   MAU Course  Procedures  MDM 25 y.o. [id number] at [redacted]w[redacted]d presenting with complaint of contractions and possible LOF. She is contracting every 2-4 minutes on external monitoring. FHT Cat I  With baseline 120, + accels, - decels Physical exam with cervix closed, no evidence of rupture. Amnisure is negative. Wet prep shows moderate yeast and few WBC.   Assessment and Plan  Patient is 25 y.o. [id number] [redacted]w[redacted]d reporting contractions and clear discharge likely secondary to contractions and yeast infection. Contractions noted in L&D without cervical change or evidence of rupture of membranes. Patient is discharged to home. She has follow-up with OB provider in the AM.  - fetal kick counts reinforced - preterm labor precautions - diflucan 150 mg    Fabio Asa 06/14/2015, 11:20 PM

## 2015-06-14 NOTE — MAU Note (Signed)
Urine in lab 

## 2015-06-14 NOTE — MAU Note (Deleted)
Pt presents with worsening contractions.  

## 2015-06-14 NOTE — Discharge Instructions (Signed)
Third Trimester of Pregnancy °The third trimester is from week 29 through week 42, months 7 through 9. The third trimester is a time when the fetus is growing rapidly. At the end of the ninth month, the fetus is about 20 inches in length and weighs 6-10 pounds.  °BODY CHANGES °Your body goes through many changes during pregnancy. The changes vary from woman to woman.  °· Your weight will continue to increase. You can expect to gain 25-35 pounds (11-16 kg) by the end of the pregnancy. °· You may begin to get stretch marks on your hips, abdomen, and breasts. °· You may urinate more often because the fetus is moving lower into your pelvis and pressing on your bladder. °· You may develop or continue to have heartburn as a result of your pregnancy. °· You may develop constipation because certain hormones are causing the muscles that push waste through your intestines to slow down. °· You may develop hemorrhoids or swollen, bulging veins (varicose veins). °· You may have pelvic pain because of the weight gain and pregnancy hormones relaxing your joints between the bones in your pelvis. Backaches may result from overexertion of the muscles supporting your posture. °· You may have changes in your hair. These can include thickening of your hair, rapid growth, and changes in texture. Some women also have hair loss during or after pregnancy, or hair that feels dry or thin. Your hair will most likely return to normal after your baby is born. °· Your breasts will continue to grow and be tender. A yellow discharge may leak from your breasts called colostrum. °· Your belly button may stick out. °· You may feel short of breath because of your expanding uterus. °· You may notice the fetus "dropping," or moving lower in your abdomen. °· You may have a bloody mucus discharge. This usually occurs a few days to a week before labor begins. °· Your cervix becomes thin and soft (effaced) near your due date. °WHAT TO EXPECT AT YOUR PRENATAL  EXAMS  °You will have prenatal exams every 2 weeks until week 36. Then, you will have weekly prenatal exams. During a routine prenatal visit: °· You will be weighed to make sure you and the fetus are growing normally. °· Your blood pressure is taken. °· Your abdomen will be measured to track your baby's growth. °· The fetal heartbeat will be listened to. °· Any test results from the previous visit will be discussed. °· You may have a cervical check near your due date to see if you have effaced. °At around 36 weeks, your caregiver will check your cervix. At the same time, your caregiver will also perform a test on the secretions of the vaginal tissue. This test is to determine if a type of bacteria, Group B streptococcus, is present. Your caregiver will explain this further. °Your caregiver may ask you: °· What your birth plan is. °· How you are feeling. °· If you are feeling the baby move. °· If you have had any abnormal symptoms, such as leaking fluid, bleeding, severe headaches, or abdominal cramping. °· If you have any questions. °Other tests or screenings that may be performed during your third trimester include: °· Blood tests that check for low iron levels (anemia). °· Fetal testing to check the health, activity level, and growth of the fetus. Testing is done if you have certain medical conditions or if there are problems during the pregnancy. °FALSE LABOR °You may feel small, irregular contractions that   eventually go away. These are called Braxton Hicks contractions, or false labor. Contractions may last for hours, days, or even weeks before true labor sets in. If contractions come at regular intervals, intensify, or become painful, it is best to be seen by your caregiver.  °SIGNS OF LABOR  °· Menstrual-like cramps. °· Contractions that are 5 minutes apart or less. °· Contractions that start on the top of the uterus and spread down to the lower abdomen and back. °· A sense of increased pelvic pressure or back  pain. °· A watery or bloody mucus discharge that comes from the vagina. °If you have any of these signs before the 37th week of pregnancy, call your caregiver right away. You need to go to the hospital to get checked immediately. °HOME CARE INSTRUCTIONS  °· Avoid all smoking, herbs, alcohol, and unprescribed drugs. These chemicals affect the formation and growth of the baby. °· Follow your caregiver's instructions regarding medicine use. There are medicines that are either safe or unsafe to take during pregnancy. °· Exercise only as directed by your caregiver. Experiencing uterine cramps is a good sign to stop exercising. °· Continue to eat regular, healthy meals. °· Wear a good support bra for breast tenderness. °· Do not use hot tubs, steam rooms, or saunas. °· Wear your seat belt at all times when driving. °· Avoid raw meat, uncooked cheese, cat litter boxes, and soil used by cats. These carry germs that can cause birth defects in the baby. °· Take your prenatal vitamins. °· Try taking a stool softener (if your caregiver approves) if you develop constipation. Eat more high-fiber foods, such as fresh vegetables or fruit and whole grains. Drink plenty of fluids to keep your urine clear or pale yellow. °· Take warm sitz baths to soothe any pain or discomfort caused by hemorrhoids. Use hemorrhoid cream if your caregiver approves. °· If you develop varicose veins, wear support hose. Elevate your feet for 15 minutes, 3-4 times a day. Limit salt in your diet. °· Avoid heavy lifting, wear low heal shoes, and practice good posture. °· Rest a lot with your legs elevated if you have leg cramps or low back pain. °· Visit your dentist if you have not gone during your pregnancy. Use a soft toothbrush to brush your teeth and be gentle when you floss. °· A sexual relationship may be continued unless your caregiver directs you otherwise. °· Do not travel far distances unless it is absolutely necessary and only with the approval  of your caregiver. °· Take prenatal classes to understand, practice, and ask questions about the labor and delivery. °· Make a trial run to the hospital. °· Pack your hospital bag. °· Prepare the baby's nursery. °· Continue to go to all your prenatal visits as directed by your caregiver. °SEEK MEDICAL CARE IF: °· You are unsure if you are in labor or if your water has broken. °· You have dizziness. °· You have mild pelvic cramps, pelvic pressure, or nagging pain in your abdominal area. °· You have persistent nausea, vomiting, or diarrhea. °· You have a bad smelling vaginal discharge. °· You have pain with urination. °SEEK IMMEDIATE MEDICAL CARE IF:  °· You have a fever. °· You are leaking fluid from your vagina. °· You have spotting or bleeding from your vagina. °· You have severe abdominal cramping or pain. °· You have rapid weight loss or gain. °· You have shortness of breath with chest pain. °· You notice sudden or extreme swelling   of your face, hands, ankles, feet, or legs. °· You have not felt your baby move in over an hour. °· You have severe headaches that do not go away with medicine. °· You have vision changes. °Document Released: 12/11/2001 Document Revised: 12/22/2013 Document Reviewed: 02/17/2013 °[employer]® Patient Information ©2015 [employer]. This information is not intended to replace advice given to you by your health care provider. Make sure you discuss any questions you have with your health care provider. °Fetal Movement Counts °Patient Name: __________________________________________________ Patient Due Date: ____________________ °Performing a fetal movement count is highly recommended in high-risk pregnancies, but it is good for every pregnant woman to do. Your health care provider may ask you to start counting fetal movements at 28 weeks of the pregnancy. Fetal movements often increase: °· After eating a full meal. °· After physical activity. °· After eating or drinking something sweet or  cold. °· At rest. °Pay attention to when you feel the baby is most active. This will help you notice a pattern of your baby's sleep and wake cycles and what factors contribute to an increase in fetal movement. It is important to perform a fetal movement count at the same time each day when your baby is normally most active.  °HOW TO COUNT FETAL MOVEMENTS °· Find a quiet and comfortable area to sit or lie down on your left side. Lying on your left side provides the best blood and oxygen circulation to your baby. °· Write down the day and time on a sheet of paper or in a journal. °· Start counting kicks, flutters, swishes, rolls, or jabs in a 2-hour period. You should feel at least 10 movements within 2 hours. °· If you do not feel 10 movements in 2 hours, wait 2-3 hours and count again. Look for a change in the pattern or not enough counts in 2 hours. °SEEK MEDICAL CARE IF: °· You feel less than 10 counts in 2 hours, tried twice. °· There is no movement in over an hour. °· The pattern is changing or taking longer each day to reach 10 counts in 2 hours. °· You feel the baby is not moving as he or she usually does. °Date: ____________ Movements: ____________ Start time: ____________ Finish time: ____________  °Date: ____________ Movements: ____________ Start time: ____________ Finish time: ____________ °Date: ____________ Movements: ____________ Start time: ____________ Finish time: ____________ °Date: ____________ Movements: ____________ Start time: ____________ Finish time: ____________ °Date: ____________ Movements: ____________ Start time: ____________ Finish time: ____________ °Date: ____________ Movements: ____________ Start time: ____________ Finish time: ____________ °Date: ____________ Movements: ____________ Start time: ____________ Finish time: ____________ °Date: ____________ Movements: ____________ Start time: ____________ Finish time: ____________  °Date: ____________ Movements: ____________ Start time:  ____________ Finish time: ____________ °Date: ____________ Movements: ____________ Start time: ____________ Finish time: ____________ °Date: ____________ Movements: ____________ Start time: ____________ Finish time: ____________ °Date: ____________ Movements: ____________ Start time: ____________ Finish time: ____________ °Date: ____________ Movements: ____________ Start time: ____________ Finish time: ____________ °Date: ____________ Movements: ____________ Start time: ____________ Finish time: ____________ °Date: ____________ Movements: ____________ Start time: ____________ Finish time: ____________  °Date: ____________ Movements: ____________ Start time: ____________ Finish time: ____________ °Date: ____________ Movements: ____________ Start time: ____________ Finish time: ____________ °Date: ____________ Movements: ____________ Start time: ____________ Finish time: ____________ °Date: ____________ Movements: ____________ Start time: ____________ Finish time: ____________ °Date: ____________ Movements: ____________ Start time: ____________ Finish time: ____________ °Date: ____________ Movements: ____________ Start time: ____________ Finish time: ____________ °Date: ____________ Movements: ____________ Start time: ____________ Finish time:   ____________  °Date: ____________ Movements: ____________ Start time: ____________ Finish time: ____________ °Date: ____________ Movements: ____________ Start time: ____________ Finish time: ____________ °Date: ____________ Movements: ____________ Start time: ____________ Finish time: ____________ °Date: ____________ Movements: ____________ Start time: ____________ Finish time: ____________ °Date: ____________ Movements: ____________ Start time: ____________ Finish time: ____________ °Date: ____________ Movements: ____________ Start time: ____________ Finish time: ____________ °Date: ____________ Movements: ____________ Start time: ____________ Finish time: ____________  °Date:  ____________ Movements: ____________ Start time: ____________ Finish time: ____________ °Date: ____________ Movements: ____________ Start time: ____________ Finish time: ____________ °Date: ____________ Movements: ____________ Start time: ____________ Finish time: ____________ °Date: ____________ Movements: ____________ Start time: ____________ Finish time: ____________ °Date: ____________ Movements: ____________ Start time: ____________ Finish time: ____________ °Date: ____________ Movements: ____________ Start time: ____________ Finish time: ____________ °Date: ____________ Movements: ____________ Start time: ____________ Finish time: ____________  °Date: ____________ Movements: ____________ Start time: ____________ Finish time: ____________ °Date: ____________ Movements: ____________ Start time: ____________ Finish time: ____________ °Date: ____________ Movements: ____________ Start time: ____________ Finish time: ____________ °Date: ____________ Movements: ____________ Start time: ____________ Finish time: ____________ °Date: ____________ Movements: ____________ Start time: ____________ Finish time: ____________ °Date: ____________ Movements: ____________ Start time: ____________ Finish time: ____________ °Date: ____________ Movements: ____________ Start time: ____________ Finish time: ____________  °Date: ____________ Movements: ____________ Start time: ____________ Finish time: ____________ °Date: ____________ Movements: ____________ Start time: ____________ Finish time: ____________ °Date: ____________ Movements: ____________ Start time: ____________ Finish time: ____________ °Date: ____________ Movements: ____________ Start time: ____________ Finish time: ____________ °Date: ____________ Movements: ____________ Start time: ____________ Finish time: ____________ °Date: ____________ Movements: ____________ Start time: ____________ Finish time: ____________ °Date: ____________ Movements: ____________ Start  time: ____________ Finish time: ____________  °Date: ____________ Movements: ____________ Start time: ____________ Finish time: ____________ °Date: ____________ Movements: ____________ Start time: ____________ Finish time: ____________ °Date: ____________ Movements: ____________ Start time: ____________ Finish time: ____________ °Date: ____________ Movements: ____________ Start time: ____________ Finish time: ____________ °Date: ____________ Movements: ____________ Start time: ____________ Finish time: ____________ °Date: ____________ Movements: ____________ Start time: ____________ Finish time: ____________ °Document Released: 01/16/2007 Document Revised: 05/03/2014 Document Reviewed: 10/13/2012 °[employer]® Patient Information ©2015 [employer]. This information is not intended to replace advice given to you by your health care provider. Make sure you discuss any questions you have with your health care provider. °Braxton Hicks Contractions °Contractions of the uterus can occur throughout pregnancy. Contractions are not always a sign that you are in labor.  °WHAT ARE BRAXTON HICKS CONTRACTIONS?  °Contractions that occur before labor are called Braxton Hicks contractions, or false labor. Toward the end of pregnancy (32-34 weeks), these contractions can develop more often and may become more forceful. This is not true labor because these contractions do not result in opening (dilatation) and thinning of the cervix. They are sometimes difficult to tell apart from true labor because these contractions can be forceful and people have different pain tolerances. You should not feel embarrassed if you go to the hospital with false labor. Sometimes, the only way to tell if you are in true labor is for your health care provider to look for changes in the cervix. °If there are no prenatal problems or other health problems associated with the pregnancy, it is completely safe to be sent home with false labor and await the  onset of true labor. °HOW CAN YOU TELL THE DIFFERENCE BETWEEN TRUE AND FALSE LABOR? °False Labor °· The contractions of false labor are usually shorter and not as hard as those of true labor.   °· The contractions   are usually irregular.   °· The contractions are often felt in the front of the lower abdomen and in the groin.   °· The contractions may go away when you walk around or change positions while lying down.   °· The contractions get weaker and are shorter lasting as time goes on.   °· The contractions do not usually become progressively stronger, regular, and closer together as with true labor.   °True Labor °· Contractions in true labor last 30-70 seconds, become very regular, usually become more intense, and increase in frequency.   °· The contractions do not go away with walking.   °· The discomfort is usually felt in the top of the uterus and spreads to the lower abdomen and low back.   °· True labor can be determined by your health care provider with an exam. This will show that the cervix is dilating and getting thinner.   °WHAT TO REMEMBER °· Keep up with your usual exercises and follow other instructions given by your health care provider.   °· Take medicines as directed by your health care provider.   °· Keep your regular prenatal appointments.   °· Eat and drink lightly if you think you are going into labor.   °· If Braxton Hicks contractions are making you uncomfortable:   °· Change your position from lying down or resting to walking, or from walking to resting.   °· Sit and rest in a tub of warm water.   °· Drink 2-3 glasses of water. Dehydration may cause these contractions.   °· Do slow and deep breathing several times an hour.   °WHEN SHOULD I SEEK IMMEDIATE MEDICAL CARE? °Seek immediate medical care if: °· Your contractions become stronger, more regular, and closer together.   °· You have fluid leaking or gushing from your vagina.   °· You have a fever.   °· You pass blood-tinged mucus.    °· You have vaginal bleeding.   °· You have continuous abdominal pain.   °· You have low back pain that you never had before.   °· You feel your baby's head pushing down and causing pelvic pressure.   °· Your baby is not moving as much as it used to.   °Document Released: 12/17/2005 Document Revised: 12/22/2013 Document Reviewed: 09/28/2013 °[employer]® Patient Information ©2015 [employer]. This information is not intended to replace advice given to you by your health care provider. Make sure you discuss any questions you have with your health care provider. ° °

## 2015-06-15 ENCOUNTER — Ambulatory Visit (INDEPENDENT_AMBULATORY_CARE_PROVIDER_SITE_OTHER): Payer: Medicaid Other | Admitting: Obstetrics & Gynecology

## 2015-06-15 VITALS — BP 107/72 | HR 67 | Wt 128.0 lb

## 2015-06-15 DIAGNOSIS — Z3493 Encounter for supervision of normal pregnancy, unspecified, third trimester: Secondary | ICD-10-CM | POA: Diagnosis not present

## 2015-06-15 DIAGNOSIS — O3421 Maternal care for scar from previous cesarean delivery: Secondary | ICD-10-CM | POA: Diagnosis not present

## 2015-06-15 DIAGNOSIS — O34219 Maternal care for unspecified type scar from previous cesarean delivery: Secondary | ICD-10-CM

## 2015-06-15 NOTE — Progress Notes (Signed)
Seen in [facility] on 06/14/15 and ruled out for PTL and PROM.   Pelvic cultures done today. Closed cervix on exam. RCS scheduled at 39 weeks. No other complaints or concerns.  Labor and fetal movement precautions reviewed.

## 2015-06-15 NOTE — Patient Instructions (Signed)
Return to clinic for any obstetric concerns or go to [facility] for evaluation  

## 2015-06-16 ENCOUNTER — Encounter: Payer: Self-pay | Admitting: Obstetrics & Gynecology

## 2015-06-16 DIAGNOSIS — O99820 Streptococcus B carrier state complicating pregnancy: Secondary | ICD-10-CM | POA: Insufficient documentation

## 2015-06-16 LAB — GC/CHLAMYDIA PROBE AMP
CT Probe RNA: NEGATIVE
GC PROBE AMP APTIMA: NEGATIVE

## 2015-06-16 LAB — CULTURE, BETA STREP (GROUP B ONLY)

## 2015-06-21 ENCOUNTER — Ambulatory Visit (INDEPENDENT_AMBULATORY_CARE_PROVIDER_SITE_OTHER): Payer: Self-pay | Admitting: Pediatrics

## 2015-06-21 DIAGNOSIS — Z7681 Expectant parent(s) prebirth pediatrician visit: Secondary | ICD-10-CM

## 2015-06-21 NOTE — Progress Notes (Signed)
Prenatal counseling for impending newborn done-- Z76.81  

## 2015-06-22 ENCOUNTER — Ambulatory Visit (INDEPENDENT_AMBULATORY_CARE_PROVIDER_SITE_OTHER): Payer: Medicaid Other | Admitting: Advanced Practice Midwife

## 2015-06-22 ENCOUNTER — Encounter: Payer: Medicaid Other | Admitting: Family Medicine

## 2015-06-22 VITALS — BP 125/77 | HR 93 | Wt 130.0 lb

## 2015-06-22 DIAGNOSIS — Z3493 Encounter for supervision of normal pregnancy, unspecified, third trimester: Secondary | ICD-10-CM

## 2015-06-22 DIAGNOSIS — O99820 Streptococcus B carrier state complicating pregnancy: Secondary | ICD-10-CM

## 2015-06-22 NOTE — Patient Instructions (Signed)
Breastfeeding Challenges and Solutions Even though breastfeeding is natural, it can be challenging, especially in the first few weeks after childbirth. It is normal for problems to arise when starting to breastfeed your new baby, even if you have breastfed before. This document provides some solutions to the most common breastfeeding challenges.  CHALLENGES AND SOLUTIONS Challenge--Cracked or Sore Nipples Cracked or sore nipples are commonly experienced by breastfeeding mothers. Cracked or sore nipples often are caused by inadequate latching (when your baby's mouth attaches to your breast to breastfeed). Soreness can also happen if your baby is not positioned properly at your breast. Although nipple cracking and soreness are common during the first week after birth, nipple pain is never normal. If you experience nipple cracking or soreness that lasts longer than 1 week or nipple pain, call your health care provider or lactation consultant.  Solution Ensure proper latching and positioning of your baby by following the steps below:  Find a comfortable place to sit or lie down, with your neck and back well supported.  Place a pillow or rolled up blanket under your baby to bring him or her to the level of your breast (if you are seated).  Make sure that your baby's abdomen is facing your abdomen.  Gently massage your breast. With your fingertips, massage from your chest wall toward your nipple in a circular motion. This encourages milk flow. You may need to continue this action during the feeding if your milk flows slowly.  Support your breast with 4 fingers underneath and your thumb above your nipple. Make sure your fingers are well away from your nipple and your baby's mouth.  Stroke your baby's lips gently with your finger or nipple.  When your baby's mouth is open wide enough, quickly bring your baby to your breast, placing your entire nipple and as much of the colored area around your nipple  (areola) as possible into your baby's mouth.  More areola should be visible above your baby's upper lip than below the lower lip.  Your baby's tongue should be between his or her lower gum and your breast.  Ensure that your baby's mouth is correctly positioned around your nipple (latched). Your baby's lips should create a seal on your breast and be turned out (everted).  It is common for your baby to suck for about 2-3 minutes in order to start the flow of breast milk. Signs that your baby has successfully latched on to your nipple include:  1. Quietly tugging or quietly sucking without causing you pain.  2. Swallowing heard between every 3-4 sucks.  3. Muscle movement above and in front of his or her ears with sucking.  Signs that your baby has not successfully latched on to nipple include:   Sucking sounds or smacking sounds from your baby while nursing.   Nipple pain.  Ensure that your breasts stay moisturized and healthy by:  Avoiding the use of soap on your nipples.   Wearing a supportive bra. Avoid wearing underwire-style bras or tight bras.   Air drying your nipples for 3-4 minutes after each feeding.   Using only cotton bra pads to absorb breast milk leakage. Leaking of breast milk between feedings is normal. Be sure to change the pads if they become soaked with milk.  Using lanolin on your nipples after nursing. Lanolin helps to maintain your skin's normal moisture barrier. If you use pure lanolin you do not need to wash it off before feeding your baby again. Pure lanolin   is not toxic to your baby. You may also hand express a few drops of breast milk and gently massage that milk into your nipples, allowing it to air dry. Challenge--Breast Engorgement Breast engorgement is the overfilling of your breasts with breast milk. In the first few weeks after giving birth, you may experience breast engorgement. Breast engorgement can make your breasts throb and feel hard,  tightly stretched, warm, and tender. Engorgement peaks about the fifth day after you give birth. Having breast engorgement does not mean you have to stop breastfeeding your baby. Solution  Breastfeed when you feel the need to reduce the fullness of your breasts or when your baby shows signs of hunger. This is called "breastfeeding on demand."  Newborns (babies younger than 4 weeks) often breastfeed every 1-3 hours during the day. You may need to awaken your baby to feed if he or she is asleep at a feeding time.  Do not allow your baby to sleep longer than 5 hours during the night without a feeding.  Pump or hand express breast milk before breastfeeding to soften your breast, areola, and nipple.  Apply warm, moist heat (in the shower or with warm water-soaked hand towels) just before feeding or pumping, or massage your breast before or during breastfeeding. This increases circulation and helps your milk to flow.  Completely empty your breasts when breastfeeding or pumping. Afterward, wear a snug bra (nursing or regular) or tank top for 1-2 days to signal your body to slightly decrease milk production. Only wear snug bras or tank tops to treat engorgement. Tight bras typically should be avoided by breastfeeding mothers. Once engorgement is relieved, return to wearing regular, loose-fitting clothes.  Apply ice packs to your breasts to lessen the pain from engorgement and relieve swelling, unless the ice is uncomfortable for you.  Do not delay feedings. Try to relax when it is time to feed your baby. This helps to trigger your "let-down reflex," which releases milk from your breast.  Ensure your baby is latched on to your breast and positioned properly while breastfeeding.  Allow your baby to remain at your breast as long as he or she is latched on well and actively sucking. Your baby will let you know when he or she is done breastfeeding by pulling away from your breast or falling asleep.  Avoid  introducing bottles or pacifiers to your baby in the early weeks of breastfeeding. Wait to introduce these things until after resolving any breastfeeding challenges.  Try to pump your milk on the same schedule as when your baby would breastfeed if you are returning to work or away from home for an extended period.  Drink plenty of fluids to avoid dehydration, which can eventually put you at greater risk of breast engorgement. If you follow these suggestions, your engorgement should improve in 24-48 hours. If you are still experiencing difficulty, call your lactation consultant or health care provider.  Challenge--Plugged Milk Ducts Plugged milk ducts occur when the duct does not drain milk effectively and becomes swollen. Wearing a tight-fitting nursing bra or having difficulty with latching may cause plugged milk ducts. Not drinking enough water (8-10 c [1.9-2.4 L] per day) can contribute to plugged milk ducts. Once a duct has become plugged, hard lumps, soreness, and redness may develop in your breast.  Solution Do not delay feedings. Feed your baby frequently and try to empty your breasts of milk at each feeding. Try breastfeeding from the affected side first so there is a   better chance that the milk will drain completely from that breast. Apply warm, moist towels to your breasts for 5-10 minutes before feeding. Alternatively, a hot shower right before breastfeeding can provide the moist heat that can encourage milk flow. Gentle massage of the sore area before and during a feeding may also help. Avoid wearing tight clothing or bras that put pressure on your breasts. Wear bras that offer good support to your breasts, but avoid underwire bras. If you have a plugged milk duct and develop a fever, you need to see your health care provider.  Challenge--Mastitis Mastitis is inflammation of your breast. It usually is caused by a bacterial infection and can cause flu-like symptoms. You may develop redness in  your breast and a fever. Often when mastitis occurs, your breast becomes firm, warm, and very painful. The most common causes of mastitis are poor latching, ineffective sucking from your baby, consistent pressure on your breast (possibly from wearing a tight-fitting bra or shirt that restricts the milk flow), unusual stress or fatigue, or missed feedings.  Solution You will be given antibiotic medicine to treat the infection. It is still important to breastfeed frequently to empty your breasts. Continuing to breastfeed while you recover from mastitis will not harm your baby. Make sure your baby is positioned properly during every feeding. Apply moist heat to your breasts for a few minutes before feeding to help the milk flow and to help your breasts empty more easily. Challenge--Thrush Thrush is a yeast infection that can form on your nipples, in your breast, or in your baby's mouth. It causes itching, soreness, burning or stabbing pain, and sometimes a rash.  Solution You will be given a medicated ointment for your nipples, and your baby will be given a liquid medicine for his or her mouth. It is important that you and your baby are treated at the same time because thrush can be passed between you and your baby. Change disposable nursing pads often. Any bras, towels, or clothing that come in contact with infected areas of your body or your baby's body need to be washed in very hot water every day. Wash your hands and your baby's hands often. All pacifiers, bottle nipples, or toys your baby puts in his or her mouth should be boiled once a day for 20 minutes. After 1 week of treatment, discard pacifiers and bottle nipples and buy new ones. All breast pump parts that touch the milk need to be boiled for 20 minutes every day. Challenge--Low Milk Supply You may not be producing enough milk if your baby is not gaining the proper amount of weight. Breast milk production is based on a supply-and-demand system. Your  milk supply depends on how frequently and effectively your baby empties your breast. Solution The more you breastfeed and pump, the more breast milk you will produce. It is important that your baby empties at least one of your breasts at each feeding. If this is not happening, then use a breast pump or hand express any milk that remains. This will help to drain as much milk as possible at each feeding. It will also signal your body to produce more milk. If your baby is not emptying your breasts, it may be due to latching, sucking, or positioning problems. If low milk supply continues after addressing these issues, contact your health care provider or a lactation specialist as soon as possible. Challenge--Inverted or Flat Nipples Some women have nipples that turn inward instead of protruding outward.   Other women have nipples that are flat. Inverted or flat nipples can sometimes make it more difficult for your baby to latch onto your breast. Solution You may be given a small device that pulls out inverted nipples. This device should be applied right before your baby is brought to your breast. You can also try using a breast pump for a short time before placing the baby at your breast. The pump can pull your nipple outwards to help your infant latch more easily. The baby's sucking motion will help the inverted nipple protrude as well.  If you have flat nipples, encourage your baby to latch onto your breast and feed frequently in the early days after birth. This will give your baby practice latching on correctly while your breast is still soft. When your milk supply increases, between the second and fifth day after birth and your breasts become full, your baby will have an easier time latching.  Contact a lactation consultant if you still have concerns. She or he can teach you additional techniques to address breastfeeding problems related to nipple shape and position.  FOR MORE INFORMATION [employer]: www.llli.org Document Released: 06/10/2006 Document Revised: 12/22/2013 Document Reviewed: 06/12/2013 [employer] Patient Information 2015 [employer]. This information is not intended to replace advice given to you by your health care provider. Make sure you discuss any questions you have with your health care provider.  Fetal Movement Counts Patient Name: __________________________________________________ Patient Due Date: ____________________ Performing a fetal movement count is highly recommended in high-risk pregnancies, but it is good for every pregnant woman to do. Your health care provider may ask you to start counting fetal movements at 28 weeks of the pregnancy. Fetal movements often increase:  After eating a full meal.  After physical activity.  After eating or drinking something sweet or cold.  At rest. Pay attention to when you feel the baby is most active. This will help you notice a pattern of your baby's sleep and wake cycles and what factors contribute to an increase in fetal movement. It is important to perform a fetal movement count at the same time each day when your baby is normally most active.  HOW TO COUNT FETAL MOVEMENTS 4. Find a quiet and comfortable area to sit or lie down on your left side. Lying on your left side provides the best blood and oxygen circulation to your baby. 5. Write down the day and time on a sheet of paper or in a journal. 6. Start counting kicks, flutters, swishes, rolls, or jabs in a 2-hour period. You should feel at least 10 movements within 2 hours. 7. If you do not feel 10 movements in 2 hours, wait 2-3 hours and count again. Look for a change in the pattern or not enough counts in 2 hours. SEEK MEDICAL CARE IF:  You feel less than 10 counts in 2 hours, tried twice.  There is no movement in over an hour.  The pattern is changing or taking longer each day to reach 10 counts in 2 hours.  You feel the baby is not moving as  he or she usually does. Date: ____________ Movements: ____________ Start time: ____________ Finish time: ____________  Date: ____________ Movements: ____________ Start time: ____________ Finish time: ____________ Date: ____________ Movements: ____________ Start time: ____________ Finish time: ____________ Date: ____________ Movements: ____________ Start time: ____________ Finish time: ____________ Date: ____________ Movements: ____________ Start time: ____________ Finish time: ____________ Date: ____________ Movements: ____________ Start time: ____________ Finish time:   ____________ Date: ____________ Movements: ____________ Start time: ____________ Finish time: ____________ Date: ____________ Movements: ____________ Start time: ____________ Finish time: ____________  Date: ____________ Movements: ____________ Start time: ____________ Finish time: ____________ Date: ____________ Movements: ____________ Start time: ____________ Finish time: ____________ Date: ____________ Movements: ____________ Start time: ____________ Finish time: ____________ Date: ____________ Movements: ____________ Start time: ____________ Finish time: ____________ Date: ____________ Movements: ____________ Start time: ____________ Finish time: ____________ Date: ____________ Movements: ____________ Start time: ____________ Finish time: ____________ Date: ____________ Movements: ____________ Start time: ____________ Finish time: ____________  Date: ____________ Movements: ____________ Start time: ____________ Finish time: ____________ Date: ____________ Movements: ____________ Start time: ____________ Finish time: ____________ Date: ____________ Movements: ____________ Start time: ____________ Finish time: ____________ Date: ____________ Movements: ____________ Start time: ____________ Finish time: ____________ Date: ____________ Movements: ____________ Start time: ____________ Finish time: ____________ Date: ____________  Movements: ____________ Start time: ____________ Finish time: ____________ Date: ____________ Movements: ____________ Start time: ____________ Finish time: ____________  Date: ____________ Movements: ____________ Start time: ____________ Finish time: ____________ Date: ____________ Movements: ____________ Start time: ____________ Finish time: ____________ Date: ____________ Movements: ____________ Start time: ____________ Finish time: ____________ Date: ____________ Movements: ____________ Start time: ____________ Finish time: ____________ Date: ____________ Movements: ____________ Start time: ____________ Finish time: ____________ Date: ____________ Movements: ____________ Start time: ____________ Finish time: ____________ Date: ____________ Movements: ____________ Start time: ____________ Finish time: ____________  Date: ____________ Movements: ____________ Start time: ____________ Finish time: ____________ Date: ____________ Movements: ____________ Start time: ____________ Finish time: ____________ Date: ____________ Movements: ____________ Start time: ____________ Finish time: ____________ Date: ____________ Movements: ____________ Start time: ____________ Finish time: ____________ Date: ____________ Movements: ____________ Start time: ____________ Finish time: ____________ Date: ____________ Movements: ____________ Start time: ____________ Finish time: ____________ Date: ____________ Movements: ____________ Start time: ____________ Finish time: ____________  Date: ____________ Movements: ____________ Start time: ____________ Finish time: ____________ Date: ____________ Movements: ____________ Start time: ____________ Finish time: ____________ Date: ____________ Movements: ____________ Start time: ____________ Finish time: ____________ Date: ____________ Movements: ____________ Start time: ____________ Finish time: ____________ Date: ____________ Movements: ____________ Start time:  ____________ Finish time: ____________ Date: ____________ Movements: ____________ Start time: ____________ Finish time: ____________ Date: ____________ Movements: ____________ Start time: ____________ Finish time: ____________  Date: ____________ Movements: ____________ Start time: ____________ Finish time: ____________ Date: ____________ Movements: ____________ Start time: ____________ Finish time: ____________ Date: ____________ Movements: ____________ Start time: ____________ Finish time: ____________ Date: ____________ Movements: ____________ Start time: ____________ Finish time: ____________ Date: ____________ Movements: ____________ Start time: ____________ Finish time: ____________ Date: ____________ Movements: ____________ Start time: ____________ Finish time: ____________ Date: ____________ Movements: ____________ Start time: ____________ Finish time: ____________  Date: ____________ Movements: ____________ Start time: ____________ Finish time: ____________ Date: ____________ Movements: ____________ Start time: ____________ Finish time: ____________ Date: ____________ Movements: ____________ Start time: ____________ Finish time: ____________ Date: ____________ Movements: ____________ Start time: ____________ Finish time: ____________ Date: ____________ Movements: ____________ Start time: ____________ Finish time: ____________ Date: ____________ Movements: ____________ Start time: ____________ Finish time: ____________ Document Released: 01/16/2007 Document Revised: 05/03/2014 Document Reviewed: 10/13/2012 [employer] Patient Information 2015 [employer]. This information is not intended to replace advice given to you by your health care provider. Make sure you discuss any questions you have with your health care provider.  

## 2015-06-22 NOTE — Progress Notes (Signed)
Pos GSB discussed.

## 2015-06-30 ENCOUNTER — Ambulatory Visit (INDEPENDENT_AMBULATORY_CARE_PROVIDER_SITE_OTHER): Payer: Medicaid Other | Admitting: Family Medicine

## 2015-06-30 ENCOUNTER — Encounter: Payer: Self-pay | Admitting: Family Medicine

## 2015-06-30 VITALS — BP 117/79 | HR 83 | Wt 128.0 lb

## 2015-06-30 DIAGNOSIS — Z3493 Encounter for supervision of normal pregnancy, unspecified, third trimester: Secondary | ICD-10-CM

## 2015-06-30 DIAGNOSIS — O34219 Maternal care for unspecified type scar from previous cesarean delivery: Secondary | ICD-10-CM

## 2015-06-30 DIAGNOSIS — O99333 Smoking (tobacco) complicating pregnancy, third trimester: Secondary | ICD-10-CM

## 2015-06-30 DIAGNOSIS — O3421 Maternal care for scar from previous cesarean delivery: Secondary | ICD-10-CM

## 2015-06-30 NOTE — Patient Instructions (Signed)
Breastfeeding Deciding to breastfeed is one of the best choices you can make for you and your baby. A change in hormones during pregnancy causes your breast tissue to grow and increases the number and size of your milk ducts. These hormones also allow proteins, sugars, and fats from your blood supply to make breast milk in your milk-producing glands. Hormones prevent breast milk from being released before your baby is born as well as prompt milk flow after birth. Once breastfeeding has begun, thoughts of your baby, as well as his or her sucking or crying, can stimulate the release of milk from your milk-producing glands.  BENEFITS OF BREASTFEEDING For Your Baby  Your first milk (colostrum) helps your baby's digestive system function better.   There are antibodies in your milk that help your baby fight off infections.   Your baby has a lower incidence of asthma, allergies, and sudden infant death syndrome.   The nutrients in breast milk are better for your baby than infant formulas and are designed uniquely for your baby's needs.   Breast milk improves your baby's brain development.   Your baby is less likely to develop other conditions, such as childhood obesity, asthma, or type 2 diabetes mellitus.  For You   Breastfeeding helps to create a very special bond between you and your baby.   Breastfeeding is convenient. Breast milk is always available at the correct temperature and costs nothing.   Breastfeeding helps to burn calories and helps you lose the weight gained during pregnancy.   Breastfeeding makes your uterus contract to its prepregnancy size faster and slows bleeding (lochia) after you give birth.   Breastfeeding helps to lower your risk of developing type 2 diabetes mellitus, osteoporosis, and breast or ovarian cancer later in life. SIGNS THAT YOUR BABY IS HUNGRY Early Signs of Hunger  Increased alertness or activity.  Stretching.  Movement of the head from  side to side.  Movement of the head and opening of the mouth when the corner of the mouth or cheek is stroked (rooting).  Increased sucking sounds, smacking lips, cooing, sighing, or squeaking.  Hand-to-mouth movements.  Increased sucking of fingers or hands. Late Signs of Hunger  Fussing.  Intermittent crying. Extreme Signs of Hunger Signs of extreme hunger will require calming and consoling before your baby will be able to breastfeed successfully. Do not wait for the following signs of extreme hunger to occur before you initiate breastfeeding:   Restlessness.  A loud, strong cry.   Screaming. BREASTFEEDING BASICS Breastfeeding Initiation  Find a comfortable place to sit or lie down, with your neck and back well supported.  Place a pillow or rolled up blanket under your baby to bring him or her to the level of your breast (if you are seated). Nursing pillows are specially designed to help support your arms and your baby while you breastfeed.  Make sure that your baby's abdomen is facing your abdomen.   Gently massage your breast. With your fingertips, massage from your chest wall toward your nipple in a circular motion. This encourages milk flow. You may need to continue this action during the feeding if your milk flows slowly.  Support your breast with 4 fingers underneath and your thumb above your nipple. Make sure your fingers are well away from your nipple and your baby's mouth.   Stroke your baby's lips gently with your finger or nipple.   When your baby's mouth is open wide enough, quickly bring your baby to your   breast, placing your entire nipple and as much of the colored area around your nipple (areola) as possible into your baby's mouth.   More areola should be visible above your baby's upper lip than below the lower lip.   Your baby's tongue should be between his or her lower gum and your breast.   Ensure that your baby's mouth is correctly positioned  around your nipple (latched). Your baby's lips should create a seal on your breast and be turned out (everted).  It is common for your baby to suck about 2-3 minutes in order to start the flow of breast milk. Latching Teaching your baby how to latch on to your breast properly is very important. An improper latch can cause nipple pain and decreased milk supply for you and poor weight gain in your baby. Also, if your baby is not latched onto your nipple properly, he or she may swallow some air during feeding. This can make your baby fussy. Burping your baby when you switch breasts during the feeding can help to get rid of the air. However, teaching your baby to latch on properly is still the best way to prevent fussiness from swallowing air while breastfeeding. Signs that your baby has successfully latched on to your nipple:    Silent tugging or silent sucking, without causing you pain.   Swallowing heard between every 3-4 sucks.    Muscle movement above and in front of his or her ears while sucking.  Signs that your baby has not successfully latched on to nipple:   Sucking sounds or smacking sounds from your baby while breastfeeding.  Nipple pain. If you think your baby has not latched on correctly, slip your finger into the corner of your baby's mouth to break the suction and place it between your baby's gums. Attempt breastfeeding initiation again. Signs of Successful Breastfeeding Signs from your baby:   A gradual decrease in the number of sucks or complete cessation of sucking.   Falling asleep.   Relaxation of his or her body.   Retention of a small amount of milk in his or her mouth.   Letting go of your breast by himself or herself. Signs from you:  Breasts that have increased in firmness, weight, and size 1-3 hours after feeding.   Breasts that are softer immediately after breastfeeding.  Increased milk volume, as well as a change in milk consistency and color by  the fifth day of breastfeeding.   Nipples that are not sore, cracked, or bleeding. Signs That Your Baby is Getting Enough Milk  Wetting at least 3 diapers in a 24-hour period. The urine should be clear and pale yellow by age 5 days.  At least 3 stools in a 24-hour period by age 5 days. The stool should be soft and yellow.  At least 3 stools in a 24-hour period by age 7 days. The stool should be seedy and yellow.  No loss of weight greater than 10% of birth weight during the first 3 days of age.  Average weight gain of 4-7 ounces (113-198 g) per week after age 4 days.  Consistent daily weight gain by age 5 days, without weight loss after the age of 2 weeks. After a feeding, your baby may spit up a small amount. This is common. BREASTFEEDING FREQUENCY AND DURATION Frequent feeding will help you make more milk and can prevent sore nipples and breast engorgement. Breastfeed when you feel the need to reduce the fullness of your breasts   or when your baby shows signs of hunger. This is called "breastfeeding on demand." Avoid introducing a pacifier to your baby while you are working to establish breastfeeding (the first 4-6 weeks after your baby is born). After this time you may choose to use a pacifier. Research has shown that pacifier use during the first year of a baby's life decreases the risk of sudden infant death syndrome (SIDS). Allow your baby to feed on each breast as long as he or she wants. Breastfeed until your baby is finished feeding. When your baby unlatches or falls asleep while feeding from the first breast, offer the second breast. Because newborns are often sleepy in the first few weeks of life, you may need to awaken your baby to get him or her to feed. Breastfeeding times will vary from baby to baby. However, the following rules can serve as a guide to help you ensure that your baby is properly fed:  Newborns (babies 4 weeks of age or younger) may breastfeed every 1-3  hours.  Newborns should not go longer than 3 hours during the day or 5 hours during the night without breastfeeding.  You should breastfeed your baby a minimum of 8 times in a 24-hour period until you begin to introduce solid foods to your baby at around 6 months of age. BREAST MILK PUMPING Pumping and storing breast milk allows you to ensure that your baby is exclusively fed your breast milk, even at times when you are unable to breastfeed. This is especially important if you are going back to work while you are still breastfeeding or when you are not able to be present during feedings. Your lactation consultant can give you guidelines on how long it is safe to store breast milk.  A breast pump is a machine that allows you to pump milk from your breast into a sterile bottle. The pumped breast milk can then be stored in a refrigerator or freezer. Some breast pumps are operated by hand, while others use electricity. Ask your lactation consultant which type will work best for you. Breast pumps can be purchased, but some hospitals and breastfeeding support groups lease breast pumps on a monthly basis. A lactation consultant can teach you how to hand express breast milk, if you prefer not to use a pump.  CARING FOR YOUR BREASTS WHILE YOU BREASTFEED Nipples can become dry, cracked, and sore while breastfeeding. The following recommendations can help keep your breasts moisturized and healthy:  Avoid using soap on your nipples.   Wear a supportive bra. Although not required, special nursing bras and tank tops are designed to allow access to your breasts for breastfeeding without taking off your entire bra or top. Avoid wearing underwire-style bras or extremely tight bras.  Air dry your nipples for 3-4minutes after each feeding.   Use only cotton bra pads to absorb leaked breast milk. Leaking of breast milk between feedings is normal.   Use lanolin on your nipples after breastfeeding. Lanolin helps to  maintain your skin's normal moisture barrier. If you use pure lanolin, you do not need to wash it off before feeding your baby again. Pure lanolin is not toxic to your baby. You may also hand express a few drops of breast milk and gently massage that milk into your nipples and allow the milk to air dry. In the first few weeks after giving birth, some women experience extremely full breasts (engorgement). Engorgement can make your breasts feel heavy, warm, and tender to the   touch. Engorgement peaks within 3-5 days after you give birth. The following recommendations can help ease engorgement:  Completely empty your breasts while breastfeeding or pumping. You may want to start by applying warm, moist heat (in the shower or with warm water-soaked hand towels) just before feeding or pumping. This increases circulation and helps the milk flow. If your baby does not completely empty your breasts while breastfeeding, pump any extra milk after he or she is finished.  Wear a snug bra (nursing or regular) or tank top for 1-2 days to signal your body to slightly decrease milk production.  Apply ice packs to your breasts, unless this is too uncomfortable for you.  Make sure that your baby is latched on and positioned properly while breastfeeding. If engorgement persists after 48 hours of following these recommendations, contact your health care provider or a lactation consultant. OVERALL HEALTH CARE RECOMMENDATIONS WHILE BREASTFEEDING  Eat healthy foods. Alternate between meals and snacks, eating 3 of each per day. Because what you eat affects your breast milk, some of the foods may make your baby more irritable than usual. Avoid eating these foods if you are sure that they are negatively affecting your baby.  Drink milk, fruit juice, and water to satisfy your thirst (about 10 glasses a day).   Rest often, relax, and continue to take your prenatal vitamins to prevent fatigue, stress, and anemia.  Continue  breast self-awareness checks.  Avoid chewing and smoking tobacco.  Avoid alcohol and drug use. Some medicines that may be harmful to your baby can pass through breast milk. It is important to ask your health care provider before taking any medicine, including all over-the-counter and prescription medicine as well as vitamin and herbal supplements. It is possible to become pregnant while breastfeeding. If birth control is desired, ask your health care provider about options that will be safe for your baby. SEEK MEDICAL CARE IF:   You feel like you want to stop breastfeeding or have become frustrated with breastfeeding.  You have painful breasts or nipples.  Your nipples are cracked or bleeding.  Your breasts are red, tender, or warm.  You have a swollen area on either breast.  You have a fever or chills.  You have nausea or vomiting.  You have drainage other than breast milk from your nipples.  Your breasts do not become full before feedings by the fifth day after you give birth.  You feel sad and depressed.  Your baby is too sleepy to eat well.  Your baby is having trouble sleeping.   Your baby is wetting less than 3 diapers in a 24-hour period.  Your baby has less than 3 stools in a 24-hour period.  Your baby's skin or the white part of his or her eyes becomes yellow.   Your baby is not gaining weight by 5 days of age. SEEK IMMEDIATE MEDICAL CARE IF:   Your baby is overly tired (lethargic) and does not want to wake up and feed.  Your baby develops an unexplained fever. Document Released: 12/17/2005 Document Revised: 12/22/2013 Document Reviewed: 06/10/2013 [employer] Patient Information 2015 [employer]. This information is not intended to replace advice given to you by your health care provider. Make sure you discuss any questions you have with your health care provider.  

## 2015-06-30 NOTE — Progress Notes (Signed)
Subjective:  Kim [name] is a 25 y.o. [id number] at 49106w4d being seen today for ongoing prenatal care.  Patient reports no complaints.  Contractions: Not present.  Vag. Bleeding: None. Movement: Present. Denies leaking of fluid.   The following portions of the patient's history were reviewed and updated as appropriate: allergies, current medications, past family history, past medical history, past social history, past surgical history and problem list.   Objective:   Filed Vitals:   06/30/15 0939  BP: 117/79  Pulse: 83  Weight: 128 lb (58.06 kg)    Fetal Status: Fetal Heart Rate (bpm): 133 Fundal Height: 34 cm Movement: Present     General:  Alert, oriented and cooperative. Patient is in no acute distress.  Skin: Skin is warm and dry. No rash noted.   Cardiovascular: Normal heart rate noted  Respiratory: Normal respiratory effort, no problems with respiration noted  Abdomen: Soft, gravid, appropriate for gestational age. Pain/Pressure: Absent     Vaginal: Vag. Bleeding: None.    Vag D/C Character: Thin  Extremities: Normal range of motion.  Edema: None  Mental Status: Normal mood and affect. Normal behavior. Normal judgment and thought content.   Urinalysis: Urine Protein: Negative Urine Glucose: Negative  Assessment and Plan:  Pregnancy: [id number] at 8106w4d  1. Supervision of normal pregnancy, third trimester Continue routine prenatal care.   2. Previous cesarean section complicating pregnancy, antepartum condition or complication For RCS in 3 days  3. Tobacco smoking affecting pregnancy, antepartum, third trimester    Term labor symptoms and general obstetric precautions including but not limited to vaginal bleeding, contractions, leaking of fluid and fetal movement were reviewed in detail with the patient.  Please refer to After Visit Summary for other counseling recommendations.   Return in 6 weeks (on 08/11/2015) for pp check.   Reva Boresanya S Donaven Criswell, MD

## 2015-07-01 ENCOUNTER — Encounter (HOSPITAL_COMMUNITY): Payer: Self-pay

## 2015-07-01 ENCOUNTER — Encounter (HOSPITAL_COMMUNITY)
Admission: RE | Admit: 2015-07-01 | Discharge: 2015-07-01 | Disposition: A | Discharge status: Home / Self Care | Payer: Medicaid Other | Source: Ambulatory Visit | Attending: Family Medicine | Admitting: Family Medicine

## 2015-07-01 VITALS — BP 113/83 | HR 100 | Resp 15 | Ht 68.0 in | Wt 128.0 lb

## 2015-07-01 DIAGNOSIS — O99333 Smoking (tobacco) complicating pregnancy, third trimester: Secondary | ICD-10-CM

## 2015-07-01 DIAGNOSIS — O09292 Supervision of pregnancy with other poor reproductive or obstetric history, second trimester: Secondary | ICD-10-CM

## 2015-07-01 DIAGNOSIS — Z01818 Encounter for other preprocedural examination: Secondary | ICD-10-CM | POA: Insufficient documentation

## 2015-07-01 DIAGNOSIS — J4540 Moderate persistent asthma, uncomplicated: Secondary | ICD-10-CM

## 2015-07-01 DIAGNOSIS — O34219 Maternal care for unspecified type scar from previous cesarean delivery: Secondary | ICD-10-CM

## 2015-07-01 DIAGNOSIS — O99820 Streptococcus B carrier state complicating pregnancy: Secondary | ICD-10-CM

## 2015-07-01 HISTORY — DX: Anxiety disorder, unspecified: F41.9

## 2015-07-01 HISTORY — DX: Other specified pregnancy related conditions, unspecified trimester: O26.899

## 2015-07-01 HISTORY — DX: Unspecified convulsions: R56.9

## 2015-07-01 HISTORY — DX: Heartburn: R12

## 2015-07-01 HISTORY — DX: Unspecified asthma, uncomplicated: J45.909

## 2015-07-01 LAB — CBC
HCT: 29.6 % — ABNORMAL LOW (ref 36.0–46.0)
Hemoglobin: 9.5 g/dL — ABNORMAL LOW (ref 12.0–15.0)
MCH: 27.1 pg (ref 26.0–34.0)
MCHC: 32.1 g/dL (ref 30.0–36.0)
MCV: 84.3 fL (ref 78.0–100.0)
Platelets: 256 10*3/uL (ref 150–400)
RBC: 3.51 MIL/uL — ABNORMAL LOW (ref 3.87–5.11)
RDW: 15.1 % (ref 11.5–15.5)
WBC: 8.9 10*3/uL (ref 4.0–10.5)

## 2015-07-01 NOTE — Patient Instructions (Signed)
Your procedure is scheduled on:07/03/15  Enter through the Maternity Admissions Entrance at : 730 am Pick up desk phone and dial [phone] and inform us of your arrival.  Please call [phone] if you have any problems the morning of surgery.  Remember: Do not eat food or drink liquids, including water, after midnight:Sat   You may brush your teeth the morning of surgery.   DO NOT wear jewelry, eye make-up, lipstick,body lotion, or dark fingernail polish.  (Polished toes are ok) You may wear deodorant.  If you are to be admitted after surgery, leave suitcase in car until your room has been assigned. Patients discharged on the day of surgery will not be allowed to drive home. Wear loose fitting, comfortable clothes for your ride home.

## 2015-07-02 LAB — RPR: RPR Ser Ql: NONREACTIVE

## 2015-07-03 ENCOUNTER — Inpatient Hospital Stay (HOSPITAL_COMMUNITY): Payer: Medicaid Other | Admitting: Anesthesiology

## 2015-07-03 ENCOUNTER — Encounter (HOSPITAL_COMMUNITY)
Admission: RE | Disposition: A | Discharge status: Home / Self Care | Payer: Self-pay | Source: Ambulatory Visit | Attending: Obstetrics & Gynecology

## 2015-07-03 ENCOUNTER — Inpatient Hospital Stay (HOSPITAL_COMMUNITY)
Admission: RE | Admit: 2015-07-03 | Discharge: 2015-07-06 | DRG: 766 | Disposition: A | Discharge status: Home / Self Care | Payer: Medicaid Other | Source: Ambulatory Visit | Attending: Obstetrics & Gynecology | Admitting: Obstetrics & Gynecology

## 2015-07-03 ENCOUNTER — Encounter (HOSPITAL_COMMUNITY): Payer: Self-pay | Admitting: Anesthesiology

## 2015-07-03 DIAGNOSIS — Z3483 Encounter for supervision of other normal pregnancy, third trimester: Secondary | ICD-10-CM | POA: Diagnosis present

## 2015-07-03 DIAGNOSIS — N858 Other specified noninflammatory disorders of uterus: Secondary | ICD-10-CM

## 2015-07-03 DIAGNOSIS — O99824 Streptococcus B carrier state complicating childbirth: Secondary | ICD-10-CM | POA: Diagnosis present

## 2015-07-03 DIAGNOSIS — F319 Bipolar disorder, unspecified: Secondary | ICD-10-CM | POA: Diagnosis present

## 2015-07-03 DIAGNOSIS — Z3A39 39 weeks gestation of pregnancy: Secondary | ICD-10-CM | POA: Diagnosis not present

## 2015-07-03 DIAGNOSIS — F17210 Nicotine dependence, cigarettes, uncomplicated: Secondary | ICD-10-CM | POA: Diagnosis present

## 2015-07-03 DIAGNOSIS — O99344 Other mental disorders complicating childbirth: Secondary | ICD-10-CM | POA: Diagnosis present

## 2015-07-03 DIAGNOSIS — O99334 Smoking (tobacco) complicating childbirth: Secondary | ICD-10-CM | POA: Diagnosis present

## 2015-07-03 DIAGNOSIS — O3421 Maternal care for scar from previous cesarean delivery: Secondary | ICD-10-CM | POA: Diagnosis not present

## 2015-07-03 DIAGNOSIS — Z98891 History of uterine scar from previous surgery: Secondary | ICD-10-CM

## 2015-07-03 LAB — PREPARE RBC (CROSSMATCH)

## 2015-07-03 SURGERY — Surgical Case
Anesthesia: Spinal | Site: Abdomen | Laterality: Bilateral

## 2015-07-03 MED ORDER — PHENYLEPHRINE 8 MG IN D5W 100 ML (0.08MG/ML) PREMIX OPTIME
INJECTION | INTRAVENOUS | Status: AC
  Filled 2015-07-03: qty 100

## 2015-07-03 MED ORDER — MENTHOL 3 MG MT LOZG: 1.0000 | LOZENGE | OROMUCOSAL | Status: DC | PRN

## 2015-07-03 MED ORDER — ACETAMINOPHEN 500 MG PO TABS
1000.0000 mg | ORAL_TABLET | Freq: Four times a day (QID) | ORAL | Status: AC
  Administered 2015-07-03 (×3): 1000 mg via ORAL
  Filled 2015-07-03 (×2): qty 2

## 2015-07-03 MED ORDER — SODIUM CHLORIDE 0.9 % IJ SOLN: 3.0000 mL | INTRAMUSCULAR | Status: DC | PRN

## 2015-07-03 MED ORDER — MORPHINE SULFATE (PF) 0.5 MG/ML IJ SOLN
INTRAMUSCULAR | Status: DC | PRN
  Administered 2015-07-03: .1 mg via INTRATHECAL

## 2015-07-03 MED ORDER — NALBUPHINE HCL 10 MG/ML IJ SOLN: 5.0000 mg | Freq: Once | INTRAMUSCULAR | Status: AC | PRN

## 2015-07-03 MED ORDER — LACTATED RINGERS IV SOLN
INTRAVENOUS | Status: DC
  Administered 2015-07-03: 18:00:00 via INTRAVENOUS

## 2015-07-03 MED ORDER — ONDANSETRON HCL 4 MG/2ML IJ SOLN
INTRAMUSCULAR | Status: DC | PRN
  Administered 2015-07-03: 4 mg via INTRAVENOUS

## 2015-07-03 MED ORDER — OXYCODONE-ACETAMINOPHEN 5-325 MG PO TABS
1.0000 | ORAL_TABLET | ORAL | Status: DC | PRN
  Administered 2015-07-04 – 2015-07-05 (×5): 1 via ORAL
  Filled 2015-07-03 (×5): qty 1

## 2015-07-03 MED ORDER — CITRIC ACID-SODIUM CITRATE 334-500 MG/5ML PO SOLN
ORAL | Status: AC
  Filled 2015-07-03: qty 15

## 2015-07-03 MED ORDER — LACTATED RINGERS IV SOLN
INTRAVENOUS | Status: DC
  Administered 2015-07-03: 125 mL/h via INTRAVENOUS

## 2015-07-03 MED ORDER — SODIUM CHLORIDE 0.9 % IV SOLN: INTRAVENOUS | Status: DC

## 2015-07-03 MED ORDER — NALBUPHINE HCL 10 MG/ML IJ SOLN: 5.0000 mg | INTRAMUSCULAR | Status: DC | PRN

## 2015-07-03 MED ORDER — ONDANSETRON HCL 4 MG/2ML IJ SOLN
INTRAMUSCULAR | Status: AC
  Filled 2015-07-03: qty 2

## 2015-07-03 MED ORDER — MAGNESIUM HYDROXIDE 400 MG/5ML PO SUSP: 30.0000 mL | ORAL | Status: DC | PRN

## 2015-07-03 MED ORDER — SCOPOLAMINE 1 MG/3DAYS TD PT72
1.0000 | MEDICATED_PATCH | Freq: Once | TRANSDERMAL | Status: DC
  Administered 2015-07-03: 1.5 mg via TRANSDERMAL

## 2015-07-03 MED ORDER — ONDANSETRON HCL 4 MG/2ML IJ SOLN: 4.0000 mg | Freq: Three times a day (TID) | INTRAMUSCULAR | Status: DC | PRN

## 2015-07-03 MED ORDER — NALOXONE HCL 0.4 MG/ML IJ SOLN: 0.4000 mg | INTRAMUSCULAR | Status: DC | PRN

## 2015-07-03 MED ORDER — FENTANYL CITRATE (PF) 100 MCG/2ML IJ SOLN
INTRAMUSCULAR | Status: AC
  Filled 2015-07-03: qty 2

## 2015-07-03 MED ORDER — BUPIVACAINE HCL (PF) 0.5 % IJ SOLN
INTRAMUSCULAR | Status: DC | PRN
  Administered 2015-07-03: 30 mL

## 2015-07-03 MED ORDER — CITRIC ACID-SODIUM CITRATE 334-500 MG/5ML PO SOLN
30.0000 mL | Freq: Once | ORAL | Status: AC
  Administered 2015-07-03: 30 mL via ORAL

## 2015-07-03 MED ORDER — OXYTOCIN 10 UNIT/ML IJ SOLN
40.0000 [IU] | INTRAVENOUS | Status: DC | PRN
  Administered 2015-07-03: 40 [IU] via INTRAVENOUS

## 2015-07-03 MED ORDER — CEFAZOLIN SODIUM-DEXTROSE 2-3 GM-% IV SOLR
2.0000 g | INTRAVENOUS | Status: AC
  Administered 2015-07-03: 2 g via INTRAVENOUS

## 2015-07-03 MED ORDER — FENTANYL CITRATE (PF) 100 MCG/2ML IJ SOLN
INTRAMUSCULAR | Status: DC | PRN
  Administered 2015-07-03: 25 ug via INTRATHECAL

## 2015-07-03 MED ORDER — DIPHENHYDRAMINE HCL 50 MG/ML IJ SOLN: 12.5000 mg | INTRAMUSCULAR | Status: DC | PRN

## 2015-07-03 MED ORDER — ACETAMINOPHEN 325 MG PO TABS: 650.0000 mg | ORAL_TABLET | ORAL | Status: DC | PRN

## 2015-07-03 MED ORDER — TETANUS-DIPHTH-ACELL PERTUSSIS 5-2.5-18.5 LF-MCG/0.5 IM SUSP: 0.5000 mL | Freq: Once | INTRAMUSCULAR | Status: DC

## 2015-07-03 MED ORDER — DIBUCAINE 1 % RE OINT: 1.0000 "application " | TOPICAL_OINTMENT | RECTAL | Status: DC | PRN

## 2015-07-03 MED ORDER — PHENYLEPHRINE 8 MG IN D5W 100 ML (0.08MG/ML) PREMIX OPTIME
INJECTION | INTRAVENOUS | Status: DC | PRN
  Administered 2015-07-03: 60 ug/min via INTRAVENOUS

## 2015-07-03 MED ORDER — PRENATAL MULTIVITAMIN CH
1.0000 | ORAL_TABLET | Freq: Every day | ORAL | Status: DC
  Administered 2015-07-04 – 2015-07-06 (×3): 1 via ORAL
  Filled 2015-07-03 (×3): qty 1

## 2015-07-03 MED ORDER — MEPERIDINE HCL 25 MG/ML IJ SOLN
6.2500 mg | INTRAMUSCULAR | Status: DC | PRN
  Administered 2015-07-03: 6.25 mg via INTRAVENOUS

## 2015-07-03 MED ORDER — MEPERIDINE HCL 25 MG/ML IJ SOLN
INTRAMUSCULAR | Status: AC
  Administered 2015-07-03: 6.25 mg via INTRAVENOUS
  Filled 2015-07-03: qty 1

## 2015-07-03 MED ORDER — SIMETHICONE 80 MG PO CHEW
80.0000 mg | CHEWABLE_TABLET | ORAL | Status: DC
  Administered 2015-07-03 – 2015-07-05 (×3): 80 mg via ORAL
  Filled 2015-07-03 (×5): qty 1

## 2015-07-03 MED ORDER — HYDROMORPHONE HCL 1 MG/ML IJ SOLN
0.2500 mg | INTRAMUSCULAR | Status: DC | PRN
  Administered 2015-07-03: 0.25 mg via INTRAVENOUS
  Administered 2015-07-03: 0.5 mg via INTRAVENOUS
  Administered 2015-07-03: 0.25 mg via INTRAVENOUS

## 2015-07-03 MED ORDER — SCOPOLAMINE 1 MG/3DAYS TD PT72
1.0000 | MEDICATED_PATCH | Freq: Once | TRANSDERMAL | Status: DC
Start: 2015-07-03 — End: 2015-07-03

## 2015-07-03 MED ORDER — NALOXONE HCL 1 MG/ML IJ SOLN
1.0000 ug/kg/h | INTRAVENOUS | Status: DC | PRN
  Filled 2015-07-03: qty 2

## 2015-07-03 MED ORDER — LACTATED RINGERS IV SOLN
INTRAVENOUS | Status: DC | PRN
  Administered 2015-07-03: 10:00:00 via INTRAVENOUS

## 2015-07-03 MED ORDER — OXYTOCIN 40 UNITS IN LACTATED RINGERS INFUSION - SIMPLE MED: 62.5000 mL/h | INTRAVENOUS | Status: AC

## 2015-07-03 MED ORDER — WITCH HAZEL-GLYCERIN EX PADS: 1.0000 "application " | MEDICATED_PAD | CUTANEOUS | Status: DC | PRN

## 2015-07-03 MED ORDER — DIPHENHYDRAMINE HCL 25 MG PO CAPS
25.0000 mg | ORAL_CAPSULE | Freq: Four times a day (QID) | ORAL | Status: DC | PRN
  Administered 2015-07-05 (×2): 25 mg via ORAL
  Filled 2015-07-03 (×2): qty 1

## 2015-07-03 MED ORDER — MORPHINE SULFATE 0.5 MG/ML IJ SOLN
INTRAMUSCULAR | Status: AC
  Filled 2015-07-03: qty 10

## 2015-07-03 MED ORDER — DIPHENHYDRAMINE HCL 25 MG PO CAPS: 25.0000 mg | ORAL_CAPSULE | ORAL | Status: DC | PRN

## 2015-07-03 MED ORDER — ALPRAZOLAM 0.5 MG PO TABS: 0.5000 mg | ORAL_TABLET | Freq: Two times a day (BID) | ORAL | Status: DC | PRN

## 2015-07-03 MED ORDER — SCOPOLAMINE 1 MG/3DAYS TD PT72
MEDICATED_PATCH | TRANSDERMAL | Status: AC
  Administered 2015-07-03: 1.5 mg via TRANSDERMAL
  Filled 2015-07-03: qty 1

## 2015-07-03 MED ORDER — BUPIVACAINE HCL (PF) 0.5 % IJ SOLN
INTRAMUSCULAR | Status: AC
  Filled 2015-07-03: qty 30

## 2015-07-03 MED ORDER — IBUPROFEN 600 MG PO TABS
600.0000 mg | ORAL_TABLET | Freq: Four times a day (QID) | ORAL | Status: DC
  Administered 2015-07-03 – 2015-07-06 (×12): 600 mg via ORAL
  Filled 2015-07-03 (×12): qty 1

## 2015-07-03 MED ORDER — OXYTOCIN 10 UNIT/ML IJ SOLN
INTRAMUSCULAR | Status: AC
  Filled 2015-07-03: qty 4

## 2015-07-03 MED ORDER — SENNOSIDES-DOCUSATE SODIUM 8.6-50 MG PO TABS
2.0000 | ORAL_TABLET | ORAL | Status: DC
  Administered 2015-07-03 – 2015-07-05 (×3): 2 via ORAL
  Filled 2015-07-03 (×3): qty 2

## 2015-07-03 MED ORDER — ALBUTEROL SULFATE (2.5 MG/3ML) 0.083% IN NEBU: 3.0000 mL | INHALATION_SOLUTION | Freq: Four times a day (QID) | RESPIRATORY_TRACT | Status: DC | PRN

## 2015-07-03 MED ORDER — SIMETHICONE 80 MG PO CHEW
80.0000 mg | CHEWABLE_TABLET | ORAL | Status: DC | PRN
  Administered 2015-07-04 – 2015-07-05 (×3): 80 mg via ORAL
  Filled 2015-07-03: qty 1

## 2015-07-03 MED ORDER — MEASLES, MUMPS & RUBELLA VAC ~~LOC~~ INJ: 0.5000 mL | INJECTION | Freq: Once | SUBCUTANEOUS | Status: DC

## 2015-07-03 MED ORDER — OXYCODONE-ACETAMINOPHEN 5-325 MG PO TABS
2.0000 | ORAL_TABLET | ORAL | Status: DC | PRN
Start: 2015-07-03 — End: 2015-07-06
  Administered 2015-07-04 – 2015-07-06 (×7): 2 via ORAL
  Filled 2015-07-03 (×7): qty 2

## 2015-07-03 MED ORDER — ZOLPIDEM TARTRATE 5 MG PO TABS: 5.0000 mg | ORAL_TABLET | Freq: Every evening | ORAL | Status: DC | PRN

## 2015-07-03 MED ORDER — HYDROMORPHONE HCL 1 MG/ML IJ SOLN
INTRAMUSCULAR | Status: AC
  Administered 2015-07-03: 0.25 mg via INTRAVENOUS
  Filled 2015-07-03: qty 1

## 2015-07-03 MED ORDER — CEFAZOLIN SODIUM-DEXTROSE 2-3 GM-% IV SOLR
INTRAVENOUS | Status: AC
  Filled 2015-07-03: qty 50

## 2015-07-03 MED ORDER — LANOLIN HYDROUS EX OINT: 1.0000 "application " | TOPICAL_OINTMENT | CUTANEOUS | Status: DC | PRN

## 2015-07-03 MED ORDER — ACETAMINOPHEN 500 MG PO TABS
ORAL_TABLET | ORAL | Status: AC
  Filled 2015-07-03: qty 2

## 2015-07-03 SURGICAL SUPPLY — 34 items
CLAMP CORD UMBIL (MISCELLANEOUS) IMPLANT
CLOTH BEACON ORANGE TIMEOUT ST (SAFETY) ×3 IMPLANT
DRAPE SHEET LG 3/4 BI-LAMINATE (DRAPES) IMPLANT
DRSG OPSITE POSTOP 4X10 (GAUZE/BANDAGES/DRESSINGS) ×3 IMPLANT
DURAPREP 26ML APPLICATOR (WOUND CARE) ×3 IMPLANT
ELECT REM PT RETURN 9FT ADLT (ELECTROSURGICAL) ×3
ELECTRODE REM PT RTRN 9FT ADLT (ELECTROSURGICAL) ×1 IMPLANT
EXTRACTOR VACUUM M CUP 4 TUBE (SUCTIONS) IMPLANT
EXTRACTOR VACUUM M CUP 4' TUBE (SUCTIONS)
GLOVE BIOGEL PI IND STRL 7.0 (GLOVE) ×2 IMPLANT
GLOVE BIOGEL PI INDICATOR 7.0 (GLOVE) ×4
GLOVE ECLIPSE 7.0 STRL STRAW (GLOVE) ×3 IMPLANT
GOWN STRL REUS W/TWL LRG LVL3 (GOWN DISPOSABLE) ×6 IMPLANT
HEMOSTAT SURGICEL 2X14 (HEMOSTASIS) ×3 IMPLANT
KIT ABG SYR 3ML LUER SLIP (SYRINGE) IMPLANT
NEEDLE HYPO 22GX1.5 SAFETY (NEEDLE) ×3 IMPLANT
NEEDLE HYPO 25X5/8 SAFETYGLIDE (NEEDLE) ×3 IMPLANT
NS IRRIG 1000ML POUR BTL (IV SOLUTION) ×3 IMPLANT
PACK C SECTION WH (CUSTOM PROCEDURE TRAY) ×3 IMPLANT
PAD ABD 7.5X8 STRL (GAUZE/BANDAGES/DRESSINGS) ×3 IMPLANT
PAD ABD 8X7 1/2 STERILE (GAUZE/BANDAGES/DRESSINGS) ×3 IMPLANT
PAD OB MATERNITY 4.3X12.25 (PERSONAL CARE ITEMS) ×3 IMPLANT
RTRCTR C-SECT PINK 25CM LRG (MISCELLANEOUS) ×3 IMPLANT
SPONGE GAUZE 4X4 12PLY STER LF (GAUZE/BANDAGES/DRESSINGS) ×3 IMPLANT
SUT PDS AB 0 CTX 36 PDP370T (SUTURE) IMPLANT
SUT PLAIN 2 0 XLH (SUTURE) IMPLANT
SUT VIC AB 0 CT1 36 (SUTURE) ×6 IMPLANT
SUT VIC AB 0 CTX 36 (SUTURE) ×4
SUT VIC AB 0 CTX36XBRD ANBCTRL (SUTURE) ×2 IMPLANT
SUT VIC AB 4-0 KS 27 (SUTURE) ×3 IMPLANT
SYR CONTROL 10ML LL (SYRINGE) ×3 IMPLANT
TAPE HYPAFIX 4 X10 (GAUZE/BANDAGES/DRESSINGS) ×3 IMPLANT
TOWEL OR 17X24 6PK STRL BLUE (TOWEL DISPOSABLE) ×3 IMPLANT
TRAY FOLEY CATH SILVER 14FR (SET/KITS/TRAYS/PACK) ×3 IMPLANT

## 2015-07-03 NOTE — Addendum Note (Signed)
Addendum  created 07/03/15 1458 by Renford DillsJanet L Ananiah Maciolek, CRNA   Modules edited: Notes Section   Notes Section:  File: [id number]

## 2015-07-03 NOTE — Anesthesia Postprocedure Evaluation (Signed)
  Anesthesia Post-op Note  Patient: Kim [name]  Procedure(s) Performed: Procedure(s): REPEAT CESAREAN SECTION  (Bilateral)  Patient Location: Mother/Baby  Anesthesia Type:Spinal  Level of Consciousness: awake  Airway and Oxygen Therapy: Patient Spontanous Breathing  Post-op Pain: mild  Post-op Assessment: Patient's Cardiovascular Status Stable and Respiratory Function Stable LLE Motor Response: Purposeful movement LLE Sensation: Tingling RLE Motor Response: Purposeful movement RLE Sensation: Tingling      Post-op Vital Signs: stable  Last Vitals:  Filed Vitals:   07/03/15 1414  BP: 117/67  Pulse: 63  Temp: 36.7 C  Resp: 20    Complications: No apparent anesthesia complications

## 2015-07-03 NOTE — H&P (Signed)
ADMISSION HISTORY AND PHYSICAL  Tamala BariJessica Becka is a 25 y.o. female [phone] with IUP at 230w0d with hx of mod persistent asthma, bipolar disorder, preEclampsia in prior pregnancy by LMP c/w [redacted]w[redacted]d sono presenting for repeat cesarean section after 2 previous. She reports +FMs, No LOF, no VB, no blurry vision, headaches or peripheral edema, and RUQ pain.  She plans on bottle feeding. She request IUD for birth control.  Dating: By LMP c/w 574w2d --->  Estimated Date of Delivery: 07/10/15    Prenatal History/Complications:  Past Medical History: Past Medical History  Diagnosis Date  . Chest pain   . Depression   . Fibromyalgia   . Anemia   . Ulcer   . Rectal bleeding   . Asthma   . Gestational diabetes 2010, 2012  . Anxiety   . Heartburn during pregnancy   . Seizures 2015    x 1- unknown reason- passed out    Past Surgical History: Past Surgical History  Procedure Laterality Date  . Cesarean section      x 2  . Wisdom tooth extraction      Obstetrical History: OB History    Gravida Para Term Preterm AB TAB SAB Ectopic Multiple Living   4 2 2  1  1   2       Social History: History   Social History  . Marital Status: Divorced    Spouse Name: N/A  . Number of Children: N/A  . Years of Education: N/A   Social History Main Topics  . Smoking status: Current Some Day Smoker -- 0.25 packs/day    Types: Cigarettes  . Smokeless tobacco: Never Used  . Alcohol Use: No  . Drug Use: Yes    Special: Marijuana     Comment: not while pregnant  . Sexual Activity: Yes    Birth Control/ Protection: None   Other Topics Concern  . None   Social History Narrative    Family History: Family History  Problem Relation Age of Onset  . Depression Mother   . Fibromyalgia Mother   . Diabetes Maternal Grandmother   . Hypertension Maternal Grandmother   . Arthritis Maternal Grandmother   . Thyroid disease Maternal Grandmother     Allergies: No Known Allergies  Prescriptions  prior to admission  Medication Sig Dispense Refill Last Dose  . acetaminophen (TYLENOL) 500 MG tablet Take 1,000 mg by mouth every 6 (six) hours as needed.   Past Week at Unknown time  . ALPRAZolam (XANAX) 0.5 MG tablet Take 1 tablet (0.5 mg total) by mouth 2 (two) times daily as needed for anxiety. 60 tablet 1 Past Week at Unknown time  . calcium carbonate (TUMS - DOSED IN MG ELEMENTAL CALCIUM) 500 MG chewable tablet Chew 2 tablets by mouth 2 (two) times daily as needed for indigestion or heartburn.   Past Week at Unknown time  . Prenatal Vit-Fe Fumarate-FA (PRENATAL VITAMIN) 27-0.8 MG TABS Take 1 tablet by mouth every morning. 30 tablet 0 07/02/2015 at 2130  . albuterol (PROVENTIL HFA;VENTOLIN HFA) 108 (90 BASE) MCG/ACT inhaler Inhale 2 puffs into the lungs every 6 (six) hours as needed for wheezing or shortness of breath. 18 g 5 More than a month at Unknown time  . aspirin 81 MG chewable tablet Chew 1 tablet (81 mg total) by mouth daily. (Patient not taking: Reported on 06/30/2015) 30 tablet 5 More than a month at Unknown time     Review of Systems   All systems reviewed and  negative except as stated in HPI  Blood pressure 116/86, pulse 74, temperature 98.1 F (36.7 C), temperature source Oral, resp. rate 16, last menstrual period 10/03/2014, SpO2 100 %, unknown if currently breastfeeding. General appearance: alert and cooperative Lungs: clear to auscultation bilaterally Heart: regular rate and rhythm Abdomen: soft, non-tender; bowel sounds normal Extremities: Homans sign is negative, no sign of DVT      Prenatal labs: ABO, Rh: --/--/O POS (07/01 1055) Antibody: NEG (07/01 1055) Rubella:   RPR: Non Reactive (07/01 1055)  HBsAg: NEGATIVE (12/15 1416)  HIV: NONREACTIVE (04/13 0959)  GBS:    1 hr Glucola 100 Genetic screening  declines Anatomy US normal  Prenatal Transfer Tool  Maternal Diabetes: No Genetic Screening: Declined Maternal Ultrasounds/Referrals: Normal Fetal  Ultrasounds or other Referrals:  None Maternal Substance Abuse:  No Significant Maternal Medications:  Meds include: Other: flovent, albuterol Significant Maternal Lab Results: Lab values include: Group B Strep positive  Results for orders placed or performed during the hospital encounter of 07/03/15 (from the past 24 hour(s))  Prepare RBC (crossmatch)   Collection Time: 07/03/15  7:43 AM  Result Value Ref Range   Order Confirmation ORDER PROCESSED BY BLOOD BANK     Patient Active Problem List   Diagnosis Date Noted  . Group B Streptococcus carrier, +RV culture, currently pregnant 06/16/2015  . Previous cesarean section complicating pregnancy, antepartum condition or complication 12/14/2014  . H/O pre-eclampsia in prior pregnancy, currently pregnant 12/14/2014  . Asthma, moderate persistent, well-controlled 12/14/2014  . Tobacco smoking affecting pregnancy, antepartum 12/14/2014  . Bipolar depression 12/14/2014  . Supervision of normal pregnancy 11/16/2014  . History of diabetes as a child and gestational diabetes 11/16/2014  . Underweight 11/16/2014    Assessment: Henrietta Cieslewicz is a 25 y.o. [id number] at [redacted]w[redacted]d here for repeat cesarean section after 2 previous   #ID:  GBS +, ancef pre-op #MOF: bottle #MOC:IUD, signed BTL consents but declines today #hx of DM as child: previously on insulin, then off insulin at age 15, hx of gDM, normal 1h gtt and A1c current pregnancy  The risks of cesarean section discussed with the patient included but were not limited to: bleeding which may require transfusion or reoperation; infection which may require antibiotics; injury to bowel, bladder, ureters or other surrounding organs; injury to the fetus; need for additional procedures including hysterectomy in the event of a life-threatening hemorrhage; placental abnormalities wth subsequent pregnancies, incisional problems, thromboembolic phenomenon and other postoperative/anesthesia complications. The  patient concurred with the proposed plan, giving informed written consent for the procedure.   Patient has been NPO since 1030 last night she will remain NPO for procedure. Preoperative prophylactic antibiotics and SCDs ordered on call to the OR.  To OR when ready.   Lusero Nordlund ROCIO 07/03/2015, 8:04 AM

## 2015-07-03 NOTE — Anesthesia Preprocedure Evaluation (Addendum)
Anesthesia Evaluation  Patient identified by MRN, date of birth, ID band Patient awake    Reviewed: Allergy & Precautions, H&P , Patient's Chart, lab work & pertinent test results  Airway Mallampati: II  TM Distance: >3 FB Neck ROM: full    Dental no notable dental hx.    Pulmonary asthma , Current Smoker,  breath sounds clear to auscultation  Pulmonary exam normal       Cardiovascular Exercise Tolerance: Good Rhythm:regular Rate:Normal     Neuro/Psych    GI/Hepatic   Endo/Other  diabetes  Renal/GU      Musculoskeletal   Abdominal   Peds  Hematology  (+) anemia ,   Anesthesia Other Findings   Reproductive/Obstetrics                             Anesthesia Physical Anesthesia Plan  ASA: II  Anesthesia Plan: Spinal   Post-op Pain Management:    Induction:   Airway Management Planned:   Additional Equipment:   Intra-op Plan:   Post-operative Plan:   Informed Consent: I have reviewed the patients History and Physical, chart, labs and discussed the procedure including the risks, benefits and alternatives for the proposed anesthesia with the patient or authorized representative who has indicated his/her understanding and acceptance.   Dental Advisory Given  Plan Discussed with: CRNA  Anesthesia Plan Comments: (Lab work confirmed with CRNA in room. Platelets okay. Discussed spinal anesthetic, and patient consents to the procedure:  included risk of possible headache,backache, failed block, allergic reaction, and nerve injury. This patient was asked if she had any questions or concerns before the procedure started. )        Anesthesia Quick Evaluation

## 2015-07-03 NOTE — Op Note (Signed)
Kim BariJessica [name] PROCEDURE DATE: 07/03/2015  PREOPERATIVE DIAGNOSES: Intrauterine pregnancy at 4071w0d weeks gestation; previous cesarean section x 2  POSTOPERATIVE DIAGNOSES: The same  PROCEDURE: Repeat Low Transverse Cesarean Section  SURGEON:  Dr. Jaynie CollinsUgonna Reiner Loewen  ASSISTANT:  Dr. Fredirick LatheKristy Acosta  ANESTHESIOLOGIST: Dr. Cristela BlueKyle Jackson  INDICATIONS: Kim BariJessica Beadle is a 25 y.o. [phone] at 6271w0d here for cesarean section secondary to the indications listed under preoperative diagnoses; please see preoperative note for further details.  The risks of cesarean section were discussed with the patient including but were not limited to: bleeding which may require transfusion or reoperation; infection which may require antibiotics; injury to bowel, bladder, ureters or other surrounding organs; injury to the fetus; need for additional procedures including hysterectomy in the event of a life-threatening hemorrhage; placental abnormalities wth subsequent pregnancies, incisional problems, thromboembolic phenomenon and other postoperative/anesthesia complications.   The patient concurred with the proposed plan, giving informed written consent for the procedure.    FINDINGS:  Viable female infant in cephalic presentation.  Apgars 8 and 9.  Clear amniotic fluid.  Intact placenta, three vessel cord.  Normal uterus, fallopian tubes and ovaries bilaterally.  Minimal adhesive disease.  ANESTHESIA: Spinal INTRAVENOUS FLUIDS: 2600 ml ESTIMATED BLOOD LOSS: 800 ml URINE OUTPUT:  100 ml SPECIMENS: Placenta sent to pathology COMPLICATIONS: None immediate  PROCEDURE IN DETAIL:  The patient preoperatively received intravenous antibiotics and had sequential compression devices applied to her lower extremities.  She was then taken to the operating room where spinal anesthesia was administered and was found to be adequate. She was then placed in a dorsal supine position with a leftward tilt, and prepped and draped in a sterile manner.   A foley catheter was placed into her bladder and attached to constant gravity.  After an adequate timeout was performed, a Pfannenstiel skin incision was made with scalpel over her preexisting scar and carried through to the underlying layer of fascia. The fascia was incised in the midline, and this incision was extended bilaterally using the Mayo scissors.  Kocher clamps were applied to the superior aspect of the fascial incision and the underlying rectus muscles were dissected off bluntly. A similar process was carried out on the inferior aspect of the fascial incision. The rectus muscles were separated in the midline bluntly and the peritoneum was entered bluntly. Attention was turned to the lower uterine segment where a low transverse hysterotomy was made with a scalpel and extended bilaterally bluntly.  The infant was successfully delivered, the cord was clamped and cut after one minute, and the infant was handed over to awaiting neonatology team. Uterine massage was then administered, and the placenta delivered intact with a three-vessel cord. The uterus was then cleared of clot and debris.  The hysterotomy was closed with 0 Vicryl in a running locked fashion, and an imbricating layer was also placed with 0 Vicryl.  Figure-of-eight serosal stitches were placed to help with hemostasis.  The pelvis was cleared of all clot and debris. Hemostasis was confirmed on all surfaces.  The peritoneum and the muscles were reapproximated using 0 Vicryl interrupted stitches. The fascia was then closed using 0 Vicryl in a running fashion.  The subcutaneous layer was irrigated, and 30 ml of 0.5% Marcaine was injected subcutaneously around the incision.  The skin was closed with a 4-0 Vicryl subcuticular stitch. The patient tolerated the procedure well. Sponge, lap, instrument and needle counts were correct x 2.  She was taken to the recovery room in stable condition.  Verita Schneiders, MD, [place] Attending [place], [facility]

## 2015-07-03 NOTE — Anesthesia Postprocedure Evaluation (Signed)
  Anesthesia Post-op Note  Patient: Kim [name]  Procedure(s) Performed: Procedure(s): REPEAT CESAREAN SECTION  (Bilateral)  Patient is awake, responsive, moving her legs, and has signs of resolution of her numbness. Pain and nausea are reasonably well controlled. Vital signs are stable and clinically acceptable. Oxygen saturation is clinically acceptable. There are no apparent anesthetic complications at this time. Patient is ready for discharge.

## 2015-07-03 NOTE — Anesthesia Procedure Notes (Signed)
Spinal Patient location during procedure: OR Preanesthetic Checklist Completed: patient identified, site marked, surgical consent, pre-op evaluation, timeout performed, IV checked, risks and benefits discussed and monitors and equipment checked Spinal Block Patient position: sitting Prep: DuraPrep Patient monitoring: heart rate, cardiac monitor, continuous pulse ox and blood pressure Approach: midline Location: L3-4 Injection technique: single-shot Needle Needle type: Sprotte  Needle gauge: 24 G Needle length: 9 cm Assessment Sensory level: T4 Additional Notes Spinal Dosage in OR  Bupivicaine ml       1.8 PFMS04   mcg        100 Fentanyl mcg            25

## 2015-07-03 NOTE — Transfer of Care (Signed)
Immediate Anesthesia Transfer of Care Note  Patient: Kim [name]  Procedure(s) Performed: Procedure(s): REPEAT CESAREAN SECTION  (Bilateral)  Patient Location: PACU  Anesthesia Type:Spinal  Level of Consciousness: awake and alert   Airway & Oxygen Therapy: Patient Spontanous Breathing  Post-op Assessment: Report given to RN and Post -op Vital signs reviewed and stable  Post vital signs: Reviewed and stable  Last Vitals:  Filed Vitals:   07/03/15 1026  BP:   Pulse:   Temp: 36.3 C  Resp:     Complications: No apparent anesthesia complications

## 2015-07-04 ENCOUNTER — Encounter (HOSPITAL_COMMUNITY): Payer: Self-pay | Admitting: Obstetrics & Gynecology

## 2015-07-04 LAB — CBC
HCT: 28.5 % — ABNORMAL LOW (ref 36.0–46.0)
Hemoglobin: 9.1 g/dL — ABNORMAL LOW (ref 12.0–15.0)
MCH: 27.2 pg (ref 26.0–34.0)
MCHC: 31.9 g/dL (ref 30.0–36.0)
MCV: 85.3 fL (ref 78.0–100.0)
PLATELETS: 239 10*3/uL (ref 150–400)
RBC: 3.34 MIL/uL — ABNORMAL LOW (ref 3.87–5.11)
RDW: 14.9 % (ref 11.5–15.5)
WBC: 11 10*3/uL — AB (ref 4.0–10.5)

## 2015-07-04 MED ORDER — PNEUMOCOCCAL VAC POLYVALENT 25 MCG/0.5ML IJ INJ
0.5000 mL | INJECTION | INTRAMUSCULAR | Status: AC
  Administered 2015-07-05: 0.5 mL via INTRAMUSCULAR
  Filled 2015-07-04: qty 0.5

## 2015-07-04 NOTE — Progress Notes (Signed)
CLINICAL SOCIAL WORK MATERNAL/CHILD NOTE  Patient Details  Name: Kim [name] MRN: [id number] Date of Birth: 07/03/2017  Date:  07/04/2015  Clinical Social Worker Initiating Note:  Kim Ferrara, LCSW Date/ Time Initiated:  07/04/15/0915     Child's Name:  Kim [name]   Legal Guardian:  Kim [name] (mother) and Kim [name] (father)  Need for Interpreter:  None   Date of Referral:  07/03/15     Reason for Referral:  Current Substance Use/Substance Use During Pregnancy , History of bipolar  Referral Source:  [facility]   Address:  [name], [name] [address]  Phone number:  [phone]   Household Members:   Significant Other, other family members  Natural Supports (not living in the home):  Immediate Family, Extended Family   Professional Supports: None   Employment: Homemaker   Type of Work: FOB is employed, but is frequently away from home in order to work.   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 2)Mental Health Concerns: [name] presents with history of bipolar. She was prescribed Prozac, Seroquel, and Klonopin prior to the +UPT. She discontinued medications with news of the pregnancy, and is currently prescribed Xanax PRN. [name] endorsed significant anxiety and mood swings during the pregnancy.     Cognitive State:  Able to Concentrate , Alert , Goal Oriented , Linear Thinking    Mood/Affect:  Calm , Comfortable , Euthymic    CSW Assessment:  CSW received consult due to [name] presenting with a history of bipolar and substance use.  CSW met with [name] on two occassions on 7/4, and will continue assessment on 7/5  since assessment could not be completed due to visitors in the room. [name] provided consent for her mother, grandfather, and brother to remain in the room during the first visit. The FOB and his friend left the room when CSW arrived for the second visit, but returned before assessment was completed. The [name] presented as easily engaged and receptive to the visit. She presented in a pleasant mood and displayed an appropriate range in affect.  [name] did not present with acute mental health symptoms, and presents with insight and motivation to address mental health concerns now that she is no longer pregnant.    [name] endorsed having a strong support system that will support her as she transitions to the postpartum period. She shared that she normally lives with the FOB and his father, but due to the FOB's work schedule that has him out of the home, she will be moving in with her mother for the next week.  [name] expressed gratitude for her mother since she provides emotional support and has also assisted to provide all baby supplies.    [name] acknowledged history of bipolar, and stated that she has experienced symptoms of depression since age 54.  She stated that prior to the pregnancy, she was prescribed Prozac, Seroquel, and Klonopin. Per [name], she discontinued the medication when she learned of the pregnancy.  [name] and family confirmed that [name] experienced significant mood swings during the pregnancy, but family reported that it was "normal" pregnancy mood swings, and shared that these mood swings  were less intense when compared to her previous pregnancies.  [name] shared that mood swings tend to decrease once she is no longer pregnant.  [name] denied a history of postpartum depression/anxiety, but acknowledged an increased risk due to her mental health history.  [name] discussed that she is less concerned about her mental health during this transition to the postpartum period since she feels "better" and more "prepared" in  comparison to her previous transitions.  [name] is currently prescribed Xanax, but she discussed a desire to re-start her previous medications.   When [name] was alone, she continued to discuss the numerous psychosocial stressors during the pregnancy that resulted in worsening mental health symptoms. She stated that she does not feel safe in the neighborhood that she lives in due to numerous "drug deals" occuring nearby. [name] shared that the FOB has invited his father to live with them which has contributed to additional stress since he is not supportive of the infant.  She expressed frustration within her relationship with the FOB since he displays mixed rates of support and is often not involved. In addition, she shared that the FOB abuses etoh .  [name] openly processed the negative impact on her mental health that has resulted from their relationship. She shared that she no longer desire to be in a relationship with him since she does not like how she has felt as a result of their ongoing relationship, and does not want the infant to be exposed to the stress. [name] shared that she is actively looking to secure her own housing near her family in [place], and discussed that the FOB is aware of her intentions.  She stated that she intends to utilize the support from her mother in the postpartum period in order to reduce exposure to stress.   [name] stated that she has not yet created a plan to address her mental health needs now that she is no longer pregnant.  [name] reported that she was supposed to be referred to a psychiatrist during the pregnancy, but that she never received the referral due to issues with Medicaid.  [name] presented as receptive to referral for both therapy and psychiatry, per [name]'s desires.  In the short term, [name] and her mother stated that they will follow up with her PCP who previously prescribed the psychotropic medications in order to re-start medications. [name] and family denied any barriers to  accessing care, and shared that the PCP will be able to see her "same day" if needed.  [name] and family acknowledged need to closely monitor her thoughts and feelings in the postpartum period due to her increased risk for symptoms continuing.    Overall, [name] presents with insight related to impact of psychosocial stressors on her mental health. She discussed plans and intentions to reduce stress in the postpartum period in order to do what is "best" for herself and the infant. [name] expressed confidence in her ability to establish boundaries with the FOB, to seek out support for her mental health, and be the mother she wants to be.   CSW Plan/Description:   1)Patient/Family Education: CSW needs to discuss hospital drug screen policy 2)Information/Referral to [employer] Resources : Mental health provider for therapy and medication management 3) CSW to monitor infant's MDS and will notify CPS if the infant has a positive drug screen. 4)CSW to follow up with [name] on 7/5 in order to complete substance abuse assessment.   Sheilah Mins, LCSW 07/04/2015, 9:51 AM

## 2015-07-04 NOTE — Progress Notes (Signed)
UR chart review completed.  

## 2015-07-04 NOTE — Progress Notes (Signed)
Subjective: Postpartum Day 1: Cesarean Delivery Patient reports incisional pain, tolerating PO, + flatus and no problems voiding.    Objective: Vital signs in last 24 hours: Temp:  [97.4 F (36.3 C)-98.1 F (36.7 C)] 98 F (36.7 C) (07/04 0012) Pulse Rate:  [50-74] 51 (07/04 0500) Resp:  [16-23] 18 (07/04 0500) BP: (104-139)/(45-86) 107/58 mmHg (07/04 0500) SpO2:  [98 %-100 %] 98 % (07/04 0500)  Physical Exam:  General: alert, cooperative, appears stated age and no distress Lochia: appropriate Uterine Fundus: firm Incision: healing well, no significant drainage, no dehiscence, no significant erythema DVT Evaluation: No evidence of DVT seen on physical exam. Negative Homan's sign. No cords or calf tenderness. No significant calf/ankle edema.   Recent Labs  07/01/15 1055 07/04/15 0521  HGB 9.5* 9.1*  HCT 29.6* 28.5*    Assessment/Plan: Status post Cesarean section. Doing well postoperatively.  Continue current care.  Erasmo DownerAngela M Daemian Gahm, MD, MPH PGY-2,  [facility] 07/04/2015 7:35 AM

## 2015-07-05 LAB — TYPE AND SCREEN
ABO/RH(D): O POS
Antibody Screen: NEGATIVE
Unit division: 0
Unit division: 0

## 2015-07-05 MED ORDER — OXYCODONE-ACETAMINOPHEN 5-325 MG PO TABS: 2.0000 | ORAL_TABLET | ORAL | Status: DC | PRN

## 2015-07-05 MED ORDER — IBUPROFEN 600 MG PO TABS: 600.0000 mg | ORAL_TABLET | Freq: Four times a day (QID) | ORAL | Status: DC | PRN

## 2015-07-05 NOTE — Progress Notes (Signed)
Patient ID: Kim BariJessica Betsill, female   DOB: Aug 16, 1990, 25 y.o.   MRN: [id number] Pt would like to cancel D/C and until POD#3. D/C ordered D/C'd.   [place] Libra Gatz, CNM 07/05/2015 1:30 PM

## 2015-07-05 NOTE — Discharge Summary (Signed)
Obstetric Discharge Summary Reason for Admission: cesarean section Prenatal Procedures: ultrasound Intrapartum Procedures: cesarean: low cervical, transverse Postpartum Procedures: none Complications-Operative and Postpartum: none HEMOGLOBIN  Date Value Ref Range Status  07/04/2015 9.1* 12.0 - 15.0 g/dL Final   HGB  Date Value Ref Range Status  03/09/2014 13.0 12.0-16.0 g/dL Final   HCT  Date Value Ref Range Status  07/04/2015 28.5* 36.0 - 46.0 % Final  03/09/2014 37.9 35.0-47.0 % Final    Physical Exam:  General: alert, cooperative, appears stated age and no distress  Heart: RRR, No M/R/G Lungs: CTAB Lochia: appropriate Uterine Fundus: firm Incision: healing well, no significant drainage, no dehiscence, no significant erythema DVT Evaluation: No evidence of DVT seen on physical exam. Negative Homan's sign.  Discharge Diagnoses: Term Pregnancy-delivered  Discharge Information: Date: 07/05/2015 Activity: pelvic rest Diet: routine Medications: PNV, Ibuprofen, Colace and Percocet Condition: stable Instructions: refer to practice specific booklet Discharge to: home Follow-up Information    [facility] 6 [facility].   Specialty:  Obstetrics and Gynecology   Why:  For postpartum visit   Contact information:   [address] [place] [id number] [phone]      [facility] with [facility].   Why:  As needed in emergencies   Contact information:   [address] [id number] [place] [id number] [phone]      Newborn Data: Live born female  Birth Weight: 6 lb 14 oz (3118 g) APGAR: 8, 9  Home with mother.  Dorathy KinsmanSMITH, Levoy Geisen 07/05/2015, 12:43 PM

## 2015-07-05 NOTE — Progress Notes (Signed)
CSW received phone call from RN requesting that CSW return to the room since she entered Kim [name]'s room and found her to be highly emotional and tearful. CSW arrived to the room, and Kim [name] provided consent for her brother to remain in the room.  The Kim [name]'s brother held the infant during the visit.  CSW provided supportive listening and assisted the Kim [name] to process the events that led to the increase in emotions.   Kim [name] shared that there was a verbal altercation between the FOB and the MGM. She discussed how the MGM was yelling at the FOB for his minimal involvement and placed blame on the FOB for any potential positive drug screens for the infant.  Kim [name] shared that she was not participating in the argument, and discussed how she felt that she was in the middle as she did not want to choose sides or participate. Kim [name] shared that the [facility] became more upset and eventually left since the Kim [name] would not "stick up" for her, and she continues to feel that the [facility] is "very mad" at her.  She continued to process these events, including how she feels that she will need to "start over" with her relationship with her mother after this argument.   Kim [name] shared that she is also sad since these events occurred when her other children came to visit, and she voiced a sense of loss since it was the first meeting between her other children and this infant and "it wasn't supposed to be like this".  CSW frequently assisted Kim [name] to verbalize her feelings, and assisted to de-escalate the situation by listening, normalizing, and validating her feelings. CSW also assisted Kim [name] to utilize cognitive techniques to reduce feelings of stress and anxiety and to assist Kim [name] re-frame stressors and the situation.  Kim [name] shared belief that the situation/relationship will improve if she and her mother have a small break from one another. She also recognized how they are both tired and overwhelmed, which may also contributed to heightened emotions.  Kim [name] discussed  belief that she will continue to be able to go to her mother's home at discharge. Kim [name] required guidance to compartmentalize her feelings, but demonstrated ability to self-regulate by the end of the visit.Kim [name] reported that she was feeling "better" and was noted to be smiling as she showed CSW the photographs of her other children on her phone.   Kim [name] voiced intention to sleep, eat, and shower with a goal that these activities will help her feel better.  Kim [name] receptive to CSW follow up visit prior to end of the day in order to continue to provide ongoing emotional support.

## 2015-07-05 NOTE — Progress Notes (Signed)
CSW met with [name] in order to complete follow up visit.  [name] continues to be easily engaged and receptive to CSW visits.  She presented with a limited range in affect, but she reported feeling tired.  MGM also present in the room and was providing support to [name].   [name] continues to endorse relational stress with the FOB. She voiced frustration that he was resistant to spending the night to provide infant care.  She shared that she had anticipated that he would become more involved and invested once he saw and held the infant, but discussed how she is realizing that he is not going to change his behaviors. She stated that there continues to be a portion of her that is hopeful that he will change, but discussed that she is realizing that she needs to move forward since it is not fair for herself or the infant to continue to be involved with him.  For these reasons, [name] shared that she continues to plan to spend upcoming weeks with her mother as she attempts to secure her own housing away from the FOB.  [name] reported gratitude for her support system who continue to be present with her and support her as she continues to encounter stressors.  CSW provided education on hospital drug screen policy, [name] verbalized understanding.  [name] acknowledged that infant had a UDS and MDS collection, and she expressed confidence that the drug screens will be negative.  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  [name] aware that CPS will be contacted if the infant has a positive drug screen.   CSW clarified living arrangements with her 2 older children since she shared on 7/4 that they do not consistently live with her.  [name] discussed that her 2 children primarily live with their father, but she shared that she continues to have routine and regular visitation with them. Per [name], the children will be coming to the hospital  prior to discharge in order to meet the infant.  No additional CSW interventions needed. No barriers to discharge.  Contact CSW if specific needs arise. 

## 2015-07-05 NOTE — Discharge Instructions (Signed)

## 2015-07-06 ENCOUNTER — Telehealth: Payer: Self-pay | Admitting: Certified Nurse Midwife

## 2015-07-06 MED ORDER — OXYCODONE-ACETAMINOPHEN 5-325 MG PO TABS: 2.0000 | ORAL_TABLET | ORAL | Status: DC | PRN

## 2015-07-06 NOTE — Progress Notes (Signed)
CSW followed up for final intervention prior to discharge.  [name] and [name]'s brother in the room.   [name] reported that she will be discharging to home with the FOB. She reported feeling content with this decision plan, and shared that the FOB came to the hospital overnight to provide support. [name] presents as in a state of "acceptance" as she reflected upon desire to "move forward" and to not be willing to participate in conflict between [facility] and the FOB.  [name] stated that her mother continues to have minimal contact with her, but she reported that she continues to not be willing to "pick sides".  [name] shared that despite her earlier reports of limited support from the FOB (due to him not understanding her mental health needs and his etoh use), she discussed how she believes he may be in process of changing. She acknowledges that she has previously had hope that he will change and he has not, but discussed that she continues to have hope based on his behaviors in the past one day. [name] discussed belief that despite recent conflict with her mother, she knows that she can stay with her mother if she were to feel unsafe or unsupported in the home with FOB.  [name] smiled as she reflected upon the discharge plan. She shared that she is looking forward to returning home with the FOB.  No barriers to discharge.

## 2015-07-06 NOTE — Discharge Summary (Signed)
Obstetric Discharge Summary Reason for Admission: cesarean section Prenatal Procedures: ultrasound Intrapartum Procedures: cesarean: low cervical, transverse Postpartum Procedures: none Complications-Operative and Postpartum: none   Hospital Course:  Principal Problem:   S/P cesarean section   Kim [name] is a 25 y.o. [id number] s/p pLTCS.  Patient was admitted 7/2.  She has postpartum course that was uncomplicated including no problems with ambulating, PO intake, urination, pain, or bleeding. The pt feels ready to go home and  will be discharged with outpatient follow-up.   Today: No acute events overnight.  Pt denies problems with ambulating, voiding or po intake.  She denies nausea or vomiting.  Pain is well controlled.  She has had flatus. She has not had bowel movement.  Lochia Small.  Plan for birth control is  IUD.  Method of Feeding: Bottle  Physical Exam:  General: alert and cooperative Lochia: appropriate Uterine Fundus: firm Incision: healing well, no significant drainage DVT Evaluation: No evidence of DVT seen on physical exam.  H/H: Lab Results  Component Value Date/Time   HGB 9.1* 07/04/2015 05:21 AM   HGB 13.0 03/09/2014 03:41 PM   HCT 28.5* 07/04/2015 05:21 AM   HCT 37.9 03/09/2014 03:41 PM    Discharge Diagnoses: Term Pregnancy-delivered  Discharge Information: Date: 07/06/2015 Activity: pelvic rest Diet: routine  Medications: PNV, Ibuprofen, Colace and Percocet Breast feeding:  No: Bottle feeding Condition: stable Instructions: refer to handout Discharge to: home   Baby boy outpt circ scheduled 1 week      Medication List    TAKE these medications        acetaminophen 500 MG tablet  Commonly known as:  TYLENOL  Take 1,000 mg by mouth every 6 (six) hours as needed.     albuterol 108 (90 BASE) MCG/ACT inhaler  Commonly known as:  PROVENTIL HFA;VENTOLIN HFA  Inhale 2 puffs into the lungs every 6 (six) hours as needed for wheezing or shortness  of breath.     ALPRAZolam 0.5 MG tablet  Commonly known as:  XANAX  Take 1 tablet (0.5 mg total) by mouth 2 (two) times daily as needed for anxiety.     calcium carbonate 500 MG chewable tablet  Commonly known as:  TUMS - dosed in mg elemental calcium  Chew 2 tablets by mouth 2 (two) times daily as needed for indigestion or heartburn.     ibuprofen 600 MG tablet  Commonly known as:  ADVIL,MOTRIN  Take 1 tablet (600 mg total) by mouth every 6 (six) hours as needed for cramping.     oxyCODONE-acetaminophen 5-325 MG per tablet  Commonly known as:  PERCOCET/ROXICET  Take 2 tablets by mouth every 4 (four) hours as needed (for pain scale greater than 7).     Prenatal Vitamin 27-0.8 MG Tabs  Take 1 tablet by mouth every morning.           Follow-up Information    [facility].   Specialty:  Obstetrics and Gynecology   Why:  For postpartum visit   Contact information:   [address] [place] [phone]      Follow up with [facility] [place] [facility].   Why:  As needed in emergencies   Contact information:   [address] [id number] mc [address] [place] [phone]      De Hollingsheadatherine L Jamira Barfuss ,MD OB Fellow 07/06/2015,7:47 AM

## 2015-07-06 NOTE — Telephone Encounter (Signed)
Pt came into office because she was unable to have it filled due to credentials on paper RX. Rewrote RX for her.

## 2016-02-06 ENCOUNTER — Emergency Department (HOSPITAL_COMMUNITY): Payer: Medicaid Other

## 2016-02-06 ENCOUNTER — Encounter (HOSPITAL_COMMUNITY): Payer: Self-pay | Admitting: Emergency Medicine

## 2016-02-06 ENCOUNTER — Emergency Department (HOSPITAL_COMMUNITY)
Admission: EM | Admit: 2016-02-06 | Discharge: 2016-02-06 | Discharge status: Against Medical Advice | Payer: Medicaid Other | Attending: Emergency Medicine | Admitting: Emergency Medicine

## 2016-02-06 DIAGNOSIS — Z872 Personal history of diseases of the skin and subcutaneous tissue: Secondary | ICD-10-CM | POA: Insufficient documentation

## 2016-02-06 DIAGNOSIS — Z79899 Other long term (current) drug therapy: Secondary | ICD-10-CM | POA: Diagnosis not present

## 2016-02-06 DIAGNOSIS — O99511 Diseases of the respiratory system complicating pregnancy, first trimester: Secondary | ICD-10-CM | POA: Insufficient documentation

## 2016-02-06 DIAGNOSIS — J45901 Unspecified asthma with (acute) exacerbation: Secondary | ICD-10-CM | POA: Diagnosis not present

## 2016-02-06 DIAGNOSIS — R1032 Left lower quadrant pain: Secondary | ICD-10-CM | POA: Diagnosis not present

## 2016-02-06 DIAGNOSIS — O99611 Diseases of the digestive system complicating pregnancy, first trimester: Secondary | ICD-10-CM | POA: Insufficient documentation

## 2016-02-06 DIAGNOSIS — R1012 Left upper quadrant pain: Secondary | ICD-10-CM | POA: Insufficient documentation

## 2016-02-06 DIAGNOSIS — O210 Mild hyperemesis gravidarum: Secondary | ICD-10-CM | POA: Insufficient documentation

## 2016-02-06 DIAGNOSIS — R1013 Epigastric pain: Secondary | ICD-10-CM | POA: Insufficient documentation

## 2016-02-06 DIAGNOSIS — Z3490 Encounter for supervision of normal pregnancy, unspecified, unspecified trimester: Secondary | ICD-10-CM

## 2016-02-06 DIAGNOSIS — R1033 Periumbilical pain: Secondary | ICD-10-CM | POA: Insufficient documentation

## 2016-02-06 DIAGNOSIS — R109 Unspecified abdominal pain: Secondary | ICD-10-CM

## 2016-02-06 DIAGNOSIS — Z8659 Personal history of other mental and behavioral disorders: Secondary | ICD-10-CM | POA: Insufficient documentation

## 2016-02-06 DIAGNOSIS — O9989 Other specified diseases and conditions complicating pregnancy, childbirth and the puerperium: Secondary | ICD-10-CM | POA: Diagnosis present

## 2016-02-06 DIAGNOSIS — K921 Melena: Secondary | ICD-10-CM | POA: Diagnosis not present

## 2016-02-06 DIAGNOSIS — Z862 Personal history of diseases of the blood and blood-forming organs and certain disorders involving the immune mechanism: Secondary | ICD-10-CM | POA: Diagnosis not present

## 2016-02-06 DIAGNOSIS — Z3A09 9 weeks gestation of pregnancy: Secondary | ICD-10-CM | POA: Diagnosis not present

## 2016-02-06 DIAGNOSIS — Z8632 Personal history of gestational diabetes: Secondary | ICD-10-CM | POA: Diagnosis not present

## 2016-02-06 LAB — URINALYSIS, ROUTINE W REFLEX MICROSCOPIC
Bilirubin Urine: NEGATIVE
GLUCOSE, UA: NEGATIVE mg/dL
Hgb urine dipstick: NEGATIVE
KETONES UR: NEGATIVE mg/dL
LEUKOCYTES UA: NEGATIVE
Nitrite: NEGATIVE
PH: 8 (ref 5.0–8.0)
Protein, ur: NEGATIVE mg/dL
SPECIFIC GRAVITY, URINE: 1.016 (ref 1.005–1.030)

## 2016-02-06 LAB — COMPREHENSIVE METABOLIC PANEL
ALBUMIN: 4 g/dL (ref 3.5–5.0)
ALT: 13 U/L — ABNORMAL LOW (ref 14–54)
AST: 17 U/L (ref 15–41)
Alkaline Phosphatase: 55 U/L (ref 38–126)
Anion gap: 10 (ref 5–15)
BUN: 9 mg/dL (ref 6–20)
CHLORIDE: 102 mmol/L (ref 101–111)
CO2: 25 mmol/L (ref 22–32)
Calcium: 8.9 mg/dL (ref 8.9–10.3)
Creatinine, Ser: 0.57 mg/dL (ref 0.44–1.00)
GFR calc Af Amer: >60 mL/min (ref >60)
GLUCOSE: 65 mg/dL (ref 65–99)
POTASSIUM: 3.5 mmol/L (ref 3.5–5.1)
Sodium: 137 mmol/L (ref 135–145)
Total Bilirubin: 0.2 mg/dL — ABNORMAL LOW (ref 0.3–1.2)
Total Protein: 6.8 g/dL (ref 6.5–8.1)

## 2016-02-06 LAB — CBC
HEMATOCRIT: 35.8 % — AB (ref 36.0–46.0)
Hemoglobin: 11.8 g/dL — ABNORMAL LOW (ref 12.0–15.0)
MCH: 29.9 pg (ref 26.0–34.0)
MCHC: 33 g/dL (ref 30.0–36.0)
MCV: 90.6 fL (ref 78.0–100.0)
Platelets: 227 10*3/uL (ref 150–400)
RBC: 3.95 MIL/uL (ref 3.87–5.11)
RDW: 13.3 % (ref 11.5–15.5)
WBC: 10 10*3/uL (ref 4.0–10.5)

## 2016-02-06 LAB — LIPASE, BLOOD: LIPASE: 48 U/L (ref 11–51)

## 2016-02-06 LAB — HCG, QUANTITATIVE, PREGNANCY: hCG, Beta Chain, Quant, S: 112026 m[IU]/mL — ABNORMAL HIGH (ref <5)

## 2016-02-06 LAB — POC OCCULT BLOOD, ED: Fecal Occult Bld: NEGATIVE

## 2016-02-06 MED ORDER — ALUM & MAG HYDROXIDE-SIMETH 200-200-20 MG/5ML PO SUSP
15.0000 mL | Freq: Once | ORAL | Status: AC
  Administered 2016-02-06: 15 mL via ORAL
  Filled 2016-02-06: qty 30

## 2016-02-06 NOTE — Progress Notes (Signed)
Entered in d/c instructions Kim [name] Schedule an appointment as soon as possible for a visit [facility] This is your assigned Medicaid [place] access doctor If you prefer another contact DSS [phone] DSS assigned your doctor *You may receive a bill if you go to any family Dr not assigned to you [address] [place] [address] [phone] Medicaid [place] Access Covered Patient [place]: [phone] [address] [place], [place] [address] [email or web address] Use this website to assist with understanding your coverage & to renew application As a Medicaid client you MUST contact DSS/SSI each time you change address, move to another [place] county or another state to keep your address updated Kim [name] Medicaid Transportation to Dr appts if you are have full Medicaid: [id number], [phone]

## 2016-02-06 NOTE — ED Provider Notes (Signed)
CSN: [id number]     Arrival date & time 02/06/16  1313 History   First MD Initiated Contact with Patient 02/06/16 1532     Chief Complaint  Patient presents with  . Abdominal Pain     (Consider location/radiation/quality/duration/timing/severity/associated sxs/prior Treatment) Patient is a 26 y.o. female presenting with abdominal pain.  Abdominal Pain Pain location:  Epigastric, LUQ and LLQ Pain quality: sharp and stabbing   Pain severity:  Moderate Onset quality:  Gradual Duration:  3 days Timing:  Intermittent Progression:  Worsening Chronicity:  New Context comment:  [place].  LMP December 08, 2015. Relieved by:  Acetaminophen Worsened by:  Bowel movements Ineffective treatments:  Acetaminophen Associated symptoms: melena, nausea, shortness of breath (history of asthma) and vomiting   Associated symptoms: no chest pain, no chills, no constipation, no cough, no diarrhea, no dysuria, no fever, no hematemesis, no hematochezia, no hematuria, no vaginal bleeding and no vaginal discharge   Risk factors: pregnancy      Kim [name] is a G57P3A1  26 y.o. female with PMH significant for depression, fibromyalgia, anemia, colon ulcer, gestational diabetes, rectal bleeding, heartburn during pregnancy who presents with 3 days of intermittent, sharp, stabbing, left sided abdominal pain.   Episodes last about 20 minutes.  Aggravating factors include using the bathroom and taking hot showers.  She has taken tylenol with mild relief, last dose yesterday.  Associated symptoms include melena, N/V, and SOB (hx of asthma).  Denies vaginal discharge, vaginal bleeding, pelvic pain, urinary symptoms, CP, cough.  Last BM was yesterday and normal for her.  LMP December, 8 2016.  She reports she was recently told she was pregnant despite taking OCPs.     Past Medical History  Diagnosis Date  . Chest pain   . Depression   . Fibromyalgia   . Anemia   . Ulcer   . Rectal bleeding   . Asthma   .  Gestational diabetes 2010, 2012  . Anxiety   . Heartburn during pregnancy   . Seizures (HCC) 2015    x 1- unknown reason- passed out   Past Surgical History  Procedure Laterality Date  . Cesarean section      x 2  . Wisdom tooth extraction    . Cesarean section with bilateral tubal ligation Bilateral 07/03/2015    Procedure: REPEAT CESAREAN SECTION ;  Surgeon: Tereso Newcomer, MD;  Location: [facility];  Service: Obstetrics;  Laterality: Bilateral;   Family History  Problem Relation Age of Onset  . Depression Mother   . Fibromyalgia Mother   . Diabetes Maternal Grandmother   . Hypertension Maternal Grandmother   . Arthritis Maternal Grandmother   . Thyroid disease Maternal Grandmother    Social History  Substance Use Topics  . Smoking status: Current Some Day Smoker -- 0.25 packs/day    Types: Cigarettes  . Smokeless tobacco: Never Used  . Alcohol Use: No   OB History    Gravida Para Term Preterm AB TAB SAB Ectopic Multiple Living   5 3 3  1  1   0 3     Review of Systems  Constitutional: Negative for fever and chills.  Respiratory: Positive for shortness of breath (history of asthma). Negative for cough.   Cardiovascular: Negative for chest pain.  Gastrointestinal: Positive for nausea, vomiting, abdominal pain, blood in stool and melena. Negative for diarrhea, constipation, hematochezia and hematemesis.  Genitourinary: Negative for dysuria, urgency, frequency, hematuria, vaginal bleeding, vaginal discharge and pelvic pain.  All other  systems reviewed and are negative.  All other systems negative unless otherwise stated in HPI    Allergies  Review of patient's allergies indicates no known allergies.  Home Medications   Prior to Admission medications   Medication Sig Start Date End Date Taking? Authorizing Provider  acetaminophen (TYLENOL) 500 MG tablet Take 1,000 mg by mouth every 6 (six) hours as needed.   Yes Historical Provider, MD  Prenatal Vit-Fe Fumarate-FA  (PRENATAL VITAMIN) 27-0.8 MG TABS Take 1 tablet by mouth every morning. 11/07/14  Yes Fayrene Helper, PA-C  albuterol (PROVENTIL HFA;VENTOLIN HFA) 108 (90 BASE) MCG/ACT inhaler Inhale 2 puffs into the lungs every 6 (six) hours as needed for wheezing or shortness of breath. Patient not taking: Reported on 02/06/2016 12/14/14   Reva Bores, MD  ALPRAZolam Prudy Feeler) 0.5 MG tablet Take 1 tablet (0.5 mg total) by mouth 2 (two) times daily as needed for anxiety. Patient not taking: Reported on 02/06/2016 05/04/15   Reva Bores, MD  ibuprofen (ADVIL,MOTRIN) 600 MG tablet Take 1 tablet (600 mg total) by mouth every 6 (six) hours as needed for cramping. Patient not taking: Reported on 02/06/2016 07/05/15   Dorathy Kinsman, CNM  oxyCODONE-acetaminophen (PERCOCET/ROXICET) 5-325 MG per tablet Take 2 tablets by mouth every 4 (four) hours as needed (for pain scale greater than 7). Patient not taking: Reported on 02/06/2016 07/06/15   Lawson Fiscal A Clemmons, CNM   BP 105/63 mmHg  Pulse 56  Temp(Src) 98.2 F (36.8 C) (Oral)  Resp 18  SpO2 98%  LMP 10/03/2014 Physical Exam  Constitutional: She is oriented to person, place, and time. She appears well-developed and well-nourished.  Non-toxic appearance. She does not have a sickly appearance. She does not appear ill.  HENT:  Head: Normocephalic and atraumatic.  Mouth/Throat: Oropharynx is clear and moist.  Eyes: Conjunctivae are normal. Pupils are equal, round, and reactive to light.  Neck: Normal range of motion. Neck supple.  Cardiovascular: Normal rate, regular rhythm and normal heart sounds.   No murmur heard. Pulmonary/Chest: Effort normal and breath sounds normal. No accessory muscle usage or stridor. No respiratory distress. She has no wheezes. She has no rhonchi. She has no rales.  Abdominal: Soft. Bowel sounds are normal. She exhibits no distension. There is tenderness in the epigastric area, periumbilical area, left upper quadrant and left lower quadrant. There is no  rigidity, no rebound and no guarding.  Musculoskeletal: Normal range of motion.  Lymphadenopathy:    She has no cervical adenopathy.  Neurological: She is alert and oriented to person, place, and time.  Speech clear without dysarthria.  Skin: Skin is warm and dry.  Psychiatric: She has a normal mood and affect. Her behavior is normal.    ED Course  Procedures (including critical care time) Labs Review Labs Reviewed  COMPREHENSIVE METABOLIC PANEL - Abnormal; Notable for the following:    ALT 13 (*)    Total Bilirubin 0.2 (*)    All other components within normal limits  CBC - Abnormal; Notable for the following:    Hemoglobin 11.8 (*)    HCT 35.8 (*)    All other components within normal limits  URINALYSIS, ROUTINE W REFLEX MICROSCOPIC (NOT AT [facility]) - Abnormal; Notable for the following:    APPearance CLOUDY (*)    All other components within normal limits  HCG, QUANTITATIVE, PREGNANCY - Abnormal; Notable for the following:    hCG, Beta Chain, Quant, S [id number] (*)    All other components within normal limits  LIPASE, BLOOD  POC OCCULT BLOOD, ED    Imaging Review US Ob Comp Less 14 Wks  02/06/2016  CLINICAL DATA:  First trimester pregnancy with left lower quadrant abdominal pain. LMP 12/08/2015. Beta HCG level 112,026 EXAM: OBSTETRIC <14 WK [place] AND TRANSVAGINAL OB US TECHNIQUE: Both transabdominal and transvaginal ultrasound examinations were performed for complete evaluation of the gestation as well as the maternal uterus, adnexal regions, and pelvic cul-de-sac. Transvaginal technique was performed to assess early pregnancy. COMPARISON:  None. FINDINGS: Intrauterine gestational sac: Visualized/normal in shape. Yolk sac:  Visualized. Embryo:  Visualized. Cardiac Activity: Visualized. Heart Rate: 158  bpm CRL:  22.9  mm; 9 w 0 d;                  [place] EDC: 09/10/2016 Subchorionic hemorrhage:  None visualized.  Unfused amnion noted. Maternal uterus/adnexae: No suspicious adnexal findings.  There is a collapsing corpus luteum on the right. No significant free pelvic fluid or adnexal mass. IMPRESSION: 1. Single live intrauterine pregnancy with best estimated gestational age of [redacted] weeks 0 days. 2. No acute findings demonstrated. Electronically Signed   By: Carey Bullocks M.D.   On: 02/06/2016 18:10   US Ob Transvaginal  02/06/2016  CLINICAL DATA:  First trimester pregnancy with left lower quadrant abdominal pain. LMP 12/08/2015. Beta HCG level 112,026 EXAM: OBSTETRIC <14 WK [place] AND TRANSVAGINAL OB US TECHNIQUE: Both transabdominal and transvaginal ultrasound examinations were performed for complete evaluation of the gestation as well as the maternal uterus, adnexal regions, and pelvic cul-de-sac. Transvaginal technique was performed to assess early pregnancy. COMPARISON:  None. FINDINGS: Intrauterine gestational sac: Visualized/normal in shape. Yolk sac:  Visualized. Embryo:  Visualized. Cardiac Activity: Visualized. Heart Rate: 158  bpm CRL:  22.9  mm; 9 w 0 d;                  [place] EDC: 09/10/2016 Subchorionic hemorrhage:  None visualized.  Unfused amnion noted. Maternal uterus/adnexae: No suspicious adnexal findings. There is a collapsing corpus luteum on the right. No significant free pelvic fluid or adnexal mass. IMPRESSION: 1. Single live intrauterine pregnancy with best estimated gestational age of [redacted] weeks 0 days. 2. No acute findings demonstrated. Electronically Signed   By: Carey Bullocks M.D.   On: 02/06/2016 18:10   I have personally reviewed and evaluated these images and lab results as part of my medical decision-making.   EKG Interpretation None      MDM   Final diagnoses:  Intrauterine pregnancy  Abdominal pain, unspecified abdominal location    G5P3A1 pregnancy female presents with left sided abdominal pain x 3 days with associated melena, N/V.  Denies vaginal discharge, bleeding, or pelvic pain.  VSS, NAD.  On exam, abdomen soft with TTP in epigastric, LUQ, periumbilical,  and LLQ pain.  No rebound, guarding, or rigidity.  Lipase, CMP, UA unremarkable.  CBC remarkable for hemoglobin 11.8.  HCG quantitative 112,026. Will obtain guaiac test and OB U/S to evaluation for ectopic pregnancy.  Will give tums for possible heartburn related pregnancy.   Fecal occult--negative. OB U/S shows single live IUP with best estimated gestational age of [redacted] weeks 0 days.   I went to perform pelvic exam, and the patient was not in the room.  All patient's belongings were gone, and the gown was on the bed.  Patient left AMA.  Patient left before I was able to discuss all findings, follow up, and return precautions.    Case has been discussed  with Dr. Fredderick Phenix who agrees with the above plan.    Cheri Fowler, PA-C 02/06/16 1610  Rolan Bucco, MD 02/07/16 [phone]

## 2016-02-06 NOTE — ED Notes (Signed)
Patient transported to Ultrasound 

## 2016-02-06 NOTE — ED Notes (Signed)
Left sided lower abdominal pain. Pt states she just found out she was pregnant x1 month ago, unsure how far along she is. States last normal BM was yesterday. Denies abnormal vaginal discharge/dysuria.

## 2016-02-06 NOTE — ED Notes (Signed)
Patient not in room. Gown and blanket on bed.

## 2016-02-08 ENCOUNTER — Ambulatory Visit (INDEPENDENT_AMBULATORY_CARE_PROVIDER_SITE_OTHER): Payer: Medicaid Other | Admitting: Obstetrics and Gynecology

## 2016-02-08 ENCOUNTER — Other Ambulatory Visit (HOSPITAL_COMMUNITY)
Admission: RE | Admit: 2016-02-08 | Discharge: 2016-02-08 | Disposition: A | Discharge status: Home / Self Care | Payer: Medicaid Other | Source: Ambulatory Visit | Attending: Obstetrics and Gynecology | Admitting: Obstetrics and Gynecology

## 2016-02-08 ENCOUNTER — Encounter: Payer: Self-pay | Admitting: Obstetrics and Gynecology

## 2016-02-08 VITALS — BP 96/62 | HR 84 | Wt 104.0 lb

## 2016-02-08 DIAGNOSIS — O09899 Supervision of other high risk pregnancies, unspecified trimester: Secondary | ICD-10-CM | POA: Insufficient documentation

## 2016-02-08 DIAGNOSIS — F419 Anxiety disorder, unspecified: Secondary | ICD-10-CM

## 2016-02-08 DIAGNOSIS — Z113 Encounter for screening for infections with a predominantly sexual mode of transmission: Secondary | ICD-10-CM | POA: Insufficient documentation

## 2016-02-08 DIAGNOSIS — Z36 Encounter for antenatal screening of mother: Secondary | ICD-10-CM | POA: Diagnosis not present

## 2016-02-08 DIAGNOSIS — Z3481 Encounter for supervision of other normal pregnancy, first trimester: Secondary | ICD-10-CM

## 2016-02-08 DIAGNOSIS — O9989 Other specified diseases and conditions complicating pregnancy, childbirth and the puerperium: Secondary | ICD-10-CM

## 2016-02-08 DIAGNOSIS — Z8639 Personal history of other endocrine, nutritional and metabolic disease: Secondary | ICD-10-CM

## 2016-02-08 DIAGNOSIS — O99519 Diseases of the respiratory system complicating pregnancy, unspecified trimester: Secondary | ICD-10-CM

## 2016-02-08 DIAGNOSIS — F418 Other specified anxiety disorders: Secondary | ICD-10-CM

## 2016-02-08 DIAGNOSIS — O99331 Smoking (tobacco) complicating pregnancy, first trimester: Secondary | ICD-10-CM

## 2016-02-08 DIAGNOSIS — Z3491 Encounter for supervision of normal pregnancy, unspecified, first trimester: Secondary | ICD-10-CM

## 2016-02-08 DIAGNOSIS — O09291 Supervision of pregnancy with other poor reproductive or obstetric history, first trimester: Secondary | ICD-10-CM

## 2016-02-08 DIAGNOSIS — J45909 Unspecified asthma, uncomplicated: Secondary | ICD-10-CM | POA: Insufficient documentation

## 2016-02-08 DIAGNOSIS — O34219 Maternal care for unspecified type scar from previous cesarean delivery: Secondary | ICD-10-CM

## 2016-02-08 DIAGNOSIS — F329 Major depressive disorder, single episode, unspecified: Secondary | ICD-10-CM

## 2016-02-08 DIAGNOSIS — J4540 Moderate persistent asthma, uncomplicated: Secondary | ICD-10-CM

## 2016-02-08 LAB — COMPREHENSIVE METABOLIC PANEL
ALBUMIN: 4.1 g/dL (ref 3.6–5.1)
ALT: 11 U/L (ref 6–29)
AST: 13 U/L (ref 10–30)
Alkaline Phosphatase: 47 U/L (ref 33–115)
BUN: 5 mg/dL — ABNORMAL LOW (ref 7–25)
CALCIUM: 9.2 mg/dL (ref 8.6–10.2)
CHLORIDE: 102 mmol/L (ref 98–110)
CO2: 24 mmol/L (ref 20–31)
CREATININE: 0.56 mg/dL (ref 0.50–1.10)
Glucose, Bld: 67 mg/dL (ref 65–99)
Potassium: 4.1 mmol/L (ref 3.5–5.3)
SODIUM: 135 mmol/L (ref 135–146)
Total Bilirubin: 0.3 mg/dL (ref 0.2–1.2)
Total Protein: 6.6 g/dL (ref 6.1–8.1)

## 2016-02-08 MED ORDER — ALBUTEROL SULFATE HFA 108 (90 BASE) MCG/ACT IN AERS: 2.0000 | INHALATION_SPRAY | Freq: Four times a day (QID) | RESPIRATORY_TRACT | Status: DC | PRN

## 2016-02-08 MED ORDER — ALPRAZOLAM 0.5 MG PO TABS: ORAL_TABLET | ORAL | Status: DC

## 2016-02-08 NOTE — Patient Instructions (Signed)
First Trimester of Pregnancy The first trimester of pregnancy is from week 1 until the end of week 12 (months 1 through 3). A week after a sperm fertilizes an egg, the egg will implant on the wall of the uterus. This embryo will begin to develop into a baby. Genes from you and your partner are forming the baby. The female genes determine whether the baby is a boy or a girl. At 6-8 weeks, the eyes and face are formed, and the heartbeat can be seen on ultrasound. At the end of 12 weeks, all the baby's organs are formed.  Now that you are pregnant, you will want to do everything you can to have a healthy baby. Two of the most important things are to get good prenatal care and to follow your health care provider's instructions. Prenatal care is all the medical care you receive before the baby's birth. This care will help prevent, find, and treat any problems during the pregnancy and childbirth. BODY CHANGES Your body goes through many changes during pregnancy. The changes vary from woman to woman.   You may gain or lose a couple of pounds at first.  You may feel sick to your stomach (nauseous) and throw up (vomit). If the vomiting is uncontrollable, call your health care provider.  You may tire easily.  You may develop headaches that can be relieved by medicines approved by your health care provider.  You may urinate more often. Painful urination may mean you have a bladder infection.  You may develop heartburn as a result of your pregnancy.  You may develop constipation because certain hormones are causing the muscles that push waste through your intestines to slow down.  You may develop hemorrhoids or swollen, bulging veins (varicose veins).  Your breasts may begin to grow larger and become tender. Your nipples may stick out more, and the tissue that surrounds them (areola) may become darker.  Your gums may bleed and may be sensitive to brushing and flossing.  Dark spots or blotches  (chloasma, mask of pregnancy) may develop on your face. This will likely fade after the baby is born.  Your menstrual periods will stop.  You may have a loss of appetite.  You may develop cravings for certain kinds of food.  You may have changes in your emotions from day to day, such as being excited to be pregnant or being concerned that something may go wrong with the pregnancy and baby.  You may have more vivid and strange dreams.  You may have changes in your hair. These can include thickening of your hair, rapid growth, and changes in texture. Some women also have hair loss during or after pregnancy, or hair that feels dry or thin. Your hair will most likely return to normal after your baby is born. WHAT TO EXPECT AT YOUR PRENATAL VISITS During a routine prenatal visit:  You will be weighed to make sure you and the baby are growing normally.  Your blood pressure will be taken.  Your abdomen will be measured to track your baby's growth.  The fetal heartbeat will be listened to starting around week 10 or 12 of your pregnancy.  Test results from any previous visits will be discussed. Your health care provider may ask you:  How you are feeling.  If you are feeling the baby move.  If you have had any abnormal symptoms, such as leaking fluid, bleeding, severe headaches, or abdominal cramping.  If you are using any tobacco products,   including cigarettes, chewing tobacco, and electronic cigarettes.  If you have any questions. Other tests that may be performed during your first trimester include:  Blood tests to find your blood type and to check for the presence of any previous infections. They will also be used to check for low iron levels (anemia) and Rh antibodies. Later in the pregnancy, blood tests for diabetes will be done along with other tests if problems develop.  Urine tests to check for infections, diabetes, or protein in the urine.  An ultrasound to confirm the  proper growth and development of the baby.  An amniocentesis to check for possible genetic problems.  Fetal screens for spina bifida and Down syndrome.  You may need other tests to make sure you and the baby are doing well.  HIV (human immunodeficiency virus) testing. Routine prenatal testing includes screening for HIV, unless you choose not to have this test. HOME CARE INSTRUCTIONS  Medicines  Follow your health care provider's instructions regarding medicine use. Specific medicines may be either safe or unsafe to take during pregnancy.  Take your prenatal vitamins as directed.  If you develop constipation, try taking a stool softener if your health care provider approves. Diet  Eat regular, well-balanced meals. Choose a variety of foods, such as meat or vegetable-based protein, fish, milk and low-fat dairy products, vegetables, fruits, and whole grain breads and cereals. Your health care provider will help you determine the amount of weight gain that is right for you.  Avoid raw meat and uncooked cheese. These carry germs that can cause birth defects in the baby.  Eating four or five small meals rather than three large meals a day may help relieve nausea and vomiting. If you start to feel nauseous, eating a few soda crackers can be helpful. Drinking liquids between meals instead of during meals also seems to help nausea and vomiting.  If you develop constipation, eat more high-fiber foods, such as fresh vegetables or fruit and whole grains. Drink enough fluids to keep your urine clear or pale yellow. Activity and Exercise  Exercise only as directed by your health care provider. Exercising will help you:  Control your weight.  Stay in shape.  Be prepared for labor and delivery.  Experiencing pain or cramping in the lower abdomen or low back is a good sign that you should stop exercising. Check with your health care provider before continuing normal exercises.  Try to avoid  standing for long periods of time. Move your legs often if you must stand in one place for a long time.  Avoid heavy lifting.  Wear low-heeled shoes, and practice good posture.  You may continue to have sex unless your health care provider directs you otherwise. Relief of Pain or Discomfort  Wear a good support bra for breast tenderness.   Take warm sitz baths to soothe any pain or discomfort caused by hemorrhoids. Use hemorrhoid cream if your health care provider approves.   Rest with your legs elevated if you have leg cramps or low back pain.  If you develop varicose veins in your legs, wear support hose. Elevate your feet for 15 minutes, 3-4 times a day. Limit salt in your diet. Prenatal Care  Schedule your prenatal visits by the twelfth week of pregnancy. They are usually scheduled monthly at first, then more often in the last 2 months before delivery.  Write down your questions. Take them to your prenatal visits.  Keep all your prenatal visits as directed by your   health care provider. Safety  Wear your seat belt at all times when driving.  Make a list of emergency phone numbers, including numbers for family, friends, the hospital, and police and fire departments. General Tips  Ask your health care provider for a referral to a local prenatal education class. Begin classes no later than at the beginning of month 6 of your pregnancy.  Ask for help if you have counseling or nutritional needs during pregnancy. Your health care provider can offer advice or refer you to specialists for help with various needs.  Do not use hot tubs, steam rooms, or saunas.  Do not douche or use tampons or scented sanitary pads.  Do not cross your legs for long periods of time.  Avoid cat litter boxes and soil used by cats. These carry germs that can cause birth defects in the baby and possibly loss of the fetus by miscarriage or stillbirth.  Avoid all smoking, herbs, alcohol, and medicines  not prescribed by your health care provider. Chemicals in these affect the formation and growth of the baby.  Do not use any tobacco products, including cigarettes, chewing tobacco, and electronic cigarettes. If you need help quitting, ask your health care provider. You may receive counseling support and other resources to help you quit.  Schedule a dentist appointment. At home, brush your teeth with a soft toothbrush and be gentle when you floss. SEEK MEDICAL CARE IF:   You have dizziness.  You have mild pelvic cramps, pelvic pressure, or nagging pain in the abdominal area.  You have persistent nausea, vomiting, or diarrhea.  You have a bad smelling vaginal discharge.  You have pain with urination.  You notice increased swelling in your face, hands, legs, or ankles. SEEK IMMEDIATE MEDICAL CARE IF:   You have a fever.  You are leaking fluid from your vagina.  You have spotting or bleeding from your vagina.  You have severe abdominal cramping or pain.  You have rapid weight gain or loss.  You vomit blood or material that looks like coffee grounds.  You are exposed to [place] measles and have never had them.  You are exposed to fifth disease or chickenpox.  You develop a severe headache.  You have shortness of breath.  You have any kind of trauma, such as from a fall or a car accident.   This information is not intended to replace advice given to you by your health care provider. Make sure you discuss any questions you have with your health care provider.   Document Released: 12/11/2001 Document Revised: 01/07/2015 Document Reviewed: 10/27/2013 Elsevier Interactive Patient Education 2016 [employer]  Pregnancy and Smoking Smoking during pregnancy is unhealthy for you and your developing baby. The addictive drug nicotine, carbon monoxide, and many other poisons are inhaled from a cigarette and carried through your bloodstream to your baby. Cigarette smoke contains more  than 2,500 chemicals. It is not known which of these are harmful to a developing baby. However, both nicotine and carbon monoxide play a role in causing health problems in pregnancy. Smoking during pregnancy increases the risk of:  Birth defects in your baby, including heart defects.  Miscarriage and stillbirth.  Birth before 37 completed weeks of pregnancy (premature birth).  Pregnancy outside of the uterus (tubal pregnancy).  Attachment of the placenta over the opening of the uterus (placenta previa).  Detachment of the placenta before the baby's birth (placental abruption).  Breaking of the bag of waters before labor begins (premature rupture  of membranes). HOW DOES SMOKING DURING PREGNANCY AFFECT MY BABY? Before Birth Smoking during pregnancy:  Decreases blood flow and oxygen to your baby.  Increases the heart rate of your baby.  Slows your baby's growth in the uterus (intrauterine growth retardation). After Birth Babies born to women who smoke during pregnancy are more likely to have a low birth weight. They are also at risk for:  Serious health problems, chronic or lifelong disabilities (cerebral palsy, mental retardation, learning problems), and death.  Sudden infant death syndrome (SIDS).  Lung and breathing problems. WHAT RESOURCES ARE AVAILABLE TO HELP ME STOP SMOKING?  Ask your health care provider for help to stop smoking. The following resources are available:  Counseling.  Psychological treatment.  Acupuncture.  Family intervention.  Hypnosis.  Nicotine supplements have not been studied enough to know if they are safe to use during pregnancy. They should only be considered when all other methods fail, and if used under the close supervision of your health care provider.  Telephone QUIT lines. The national smoking cessation telephone hotline number is 1-800-QUIT NOW. FOR MORE INFORMATION  American Cancer Society: www.cancer.org  American Heart  Association: www.heart.org  National Cancer Institute: www.cancer.gov  March of Dimes: www.marchofdimes.org   This information is not intended to replace advice given to you by your health care provider. Make sure you discuss any questions you have with your health care provider.   Document Released: 04/30/2005 Document Revised: 12/22/2013 Document Reviewed: 11/16/2013 Elsevier Interactive Patient Education [employer]  Contraception Choices Contraception (birth control) is the use of any methods or devices to prevent pregnancy. Below are some methods to help avoid pregnancy. HORMONAL METHODS   Contraceptive implant. This is a thin, plastic tube containing progesterone hormone. It does not contain estrogen hormone. Your health care provider inserts the tube in the inner part of the upper arm. The tube can remain in place for up to 3 years. After 3 years, the implant must be removed. The implant prevents the ovaries from releasing an egg (ovulation), thickens the cervical mucus to prevent sperm from entering the uterus, and thins the lining of the inside of the uterus.  Progesterone-only injections. These injections are given every 3 months by your health care provider to prevent pregnancy. This synthetic progesterone hormone stops the ovaries from releasing eggs. It also thickens cervical mucus and changes the uterine lining. This makes it harder for sperm to survive in the uterus.  Birth control pills. These pills contain estrogen and progesterone hormone. They work by preventing the ovaries from releasing eggs (ovulation). They also cause the cervical mucus to thicken, preventing the sperm from entering the uterus. Birth control pills are prescribed by a health care provider.Birth control pills can also be used to treat heavy periods.  Minipill. This type of birth control pill contains only the progesterone hormone. They are taken every day of each month and must be prescribed by your  health care provider.  Birth control patch. The patch contains hormones similar to those in birth control pills. It must be changed once a week and is prescribed by a health care provider.  Vaginal ring. The ring contains hormones similar to those in birth control pills. It is left in the vagina for 3 weeks, removed for 1 week, and then a new one is put back in place. The patient must be comfortable inserting and removing the ring from the vagina.A health care provider's prescription is necessary.  Emergency contraception. Emergency contraceptives prevent pregnancy after  unprotected sexual intercourse. This pill can be taken right after sex or up to 5 days after unprotected sex. It is most effective the sooner you take the pills after having sexual intercourse. Most emergency contraceptive pills are available without a prescription. Check with your pharmacist. Do not use emergency contraception as your only form of birth control. BARRIER METHODS   Female condom. This is a thin sheath (latex or rubber) that is worn over the penis during sexual intercourse. It can be used with spermicide to increase effectiveness.  Female condom. This is a soft, loose-fitting sheath that is put into the vagina before sexual intercourse.  Diaphragm. This is a soft, latex, dome-shaped barrier that must be fitted by a health care provider. It is inserted into the vagina, along with a spermicidal jelly. It is inserted before intercourse. The diaphragm should be left in the vagina for 6 to 8 hours after intercourse.  Cervical cap. This is a round, soft, latex or plastic cup that fits over the cervix and must be fitted by a health care provider. The cap can be left in place for up to 48 hours after intercourse.  Sponge. This is a soft, circular piece of polyurethane foam. The sponge has spermicide in it. It is inserted into the vagina after wetting it and before sexual intercourse.  Spermicides. These are chemicals that  kill or block sperm from entering the cervix and uterus. They come in the form of creams, jellies, suppositories, foam, or tablets. They do not require a prescription. They are inserted into the vagina with an applicator before having sexual intercourse. The process must be repeated every time you have sexual intercourse. INTRAUTERINE CONTRACEPTION  Intrauterine device (IUD). This is a T-shaped device that is put in a woman's uterus during a menstrual period to prevent pregnancy. There are 2 types:  Copper IUD. This type of IUD is wrapped in copper wire and is placed inside the uterus. Copper makes the uterus and fallopian tubes produce a fluid that kills sperm. It can stay in place for 10 years.  Hormone IUD. This type of IUD contains the hormone progestin (synthetic progesterone). The hormone thickens the cervical mucus and prevents sperm from entering the uterus, and it also thins the uterine lining to prevent implantation of a fertilized egg. The hormone can weaken or kill the sperm that get into the uterus. It can stay in place for 3-5 years, depending on which type of IUD is used. PERMANENT METHODS OF CONTRACEPTION  Female tubal ligation. This is when the woman's fallopian tubes are surgically sealed, tied, or blocked to prevent the egg from traveling to the uterus.  Hysteroscopic sterilization. This involves placing a small coil or insert into each fallopian tube. Your doctor uses a technique called hysteroscopy to do the procedure. The device causes scar tissue to form. This results in permanent blockage of the fallopian tubes, so the sperm cannot fertilize the egg. It takes about 3 months after the procedure for the tubes to become blocked. You must use another form of birth control for these 3 months.  Female sterilization. This is when the female has the tubes that carry sperm tied off (vasectomy).This blocks sperm from entering the vagina during sexual intercourse. After the procedure, the man  can still ejaculate fluid (semen). NATURAL PLANNING METHODS  Natural family planning. This is not having sexual intercourse or using a barrier method (condom, diaphragm, cervical cap) on days the woman could become pregnant.  Calendar method. This is keeping  track of the length of each menstrual cycle and identifying when you are fertile.  Ovulation method. This is avoiding sexual intercourse during ovulation.  Symptothermal method. This is avoiding sexual intercourse during ovulation, using a thermometer and ovulation symptoms.  Post-ovulation method. This is timing sexual intercourse after you have ovulated. Regardless of which type or method of contraception you choose, it is important that you use condoms to protect against the transmission of sexually transmitted infections (STIs). Talk with your health care provider about which form of contraception is most appropriate for you.   This information is not intended to replace advice given to you by your health care provider. Make sure you discuss any questions you have with your health care provider.   Document Released: 12/17/2005 Document Revised: 12/22/2013 Document Reviewed: 06/11/2013 Elsevier Interactive Patient Education [employer]  Breastfeeding Deciding to breastfeed is one of the best choices you can make for you and your baby. A change in hormones during pregnancy causes your breast tissue to grow and increases the number and size of your milk ducts. These hormones also allow proteins, sugars, and fats from your blood supply to make breast milk in your milk-producing glands. Hormones prevent breast milk from being released before your baby is born as well as prompt milk flow after birth. Once breastfeeding has begun, thoughts of your baby, as well as his or her sucking or crying, can stimulate the release of milk from your milk-producing glands.  BENEFITS OF BREASTFEEDING For Your Baby  Your first milk (colostrum) helps  your baby's digestive system function better.  There are antibodies in your milk that help your baby fight off infections.  Your baby has a lower incidence of asthma, allergies, and sudden infant death syndrome.  The nutrients in breast milk are better for your baby than infant formulas and are designed uniquely for your baby's needs.  Breast milk improves your baby's brain development.  Your baby is less likely to develop other conditions, such as childhood obesity, asthma, or type 2 diabetes mellitus. For You  Breastfeeding helps to create a very special bond between you and your baby.  Breastfeeding is convenient. Breast milk is always available at the correct temperature and costs nothing.  Breastfeeding helps to burn calories and helps you lose the weight gained during pregnancy.  Breastfeeding makes your uterus contract to its prepregnancy size faster and slows bleeding (lochia) after you give birth.   Breastfeeding helps to lower your risk of developing type 2 diabetes mellitus, osteoporosis, and breast or ovarian cancer later in life. SIGNS THAT YOUR BABY IS HUNGRY Early Signs of Hunger  Increased alertness or activity.  Stretching.  Movement of the head from side to side.  Movement of the head and opening of the mouth when the corner of the mouth or cheek is stroked (rooting).  Increased sucking sounds, smacking lips, cooing, sighing, or squeaking.  Hand-to-mouth movements.  Increased sucking of fingers or hands. Late Signs of Hunger  Fussing.  Intermittent crying. Extreme Signs of Hunger Signs of extreme hunger will require calming and consoling before your baby will be able to breastfeed successfully. Do not wait for the following signs of extreme hunger to occur before you initiate breastfeeding:  Restlessness.  A loud, strong cry.  Screaming. BREASTFEEDING BASICS Breastfeeding Initiation  Find a comfortable place to sit or lie down, with your neck  and back well supported.  Place a pillow or rolled up blanket under your baby to bring him or  her to the level of your breast (if you are seated). Nursing pillows are specially designed to help support your arms and your baby while you breastfeed.  Make sure that your baby's abdomen is facing your abdomen.  Gently massage your breast. With your fingertips, massage from your chest wall toward your nipple in a circular motion. This encourages milk flow. You may need to continue this action during the feeding if your milk flows slowly.  Support your breast with 4 fingers underneath and your thumb above your nipple. Make sure your fingers are well away from your nipple and your baby's mouth.  Stroke your baby's lips gently with your finger or nipple.  When your baby's mouth is open wide enough, quickly bring your baby to your breast, placing your entire nipple and as much of the colored area around your nipple (areola) as possible into your baby's mouth.  More areola should be visible above your baby's upper lip than below the lower lip.  Your baby's tongue should be between his or her lower gum and your breast.  Ensure that your baby's mouth is correctly positioned around your nipple (latched). Your baby's lips should create a seal on your breast and be turned out (everted).  It is common for your baby to suck about 2-3 minutes in order to start the flow of breast milk. Latching Teaching your baby how to latch on to your breast properly is very important. An improper latch can cause nipple pain and decreased milk supply for you and poor weight gain in your baby. Also, if your baby is not latched onto your nipple properly, he or she may swallow some air during feeding. This can make your baby fussy. Burping your baby when you switch breasts during the feeding can help to get rid of the air. However, teaching your baby to latch on properly is still the best way to prevent fussiness from swallowing  air while breastfeeding. Signs that your baby has successfully latched on to your nipple:  Silent tugging or silent sucking, without causing you pain.  Swallowing heard between every 3-4 sucks.  Muscle movement above and in front of his or her ears while sucking. Signs that your baby has not successfully latched on to nipple:  Sucking sounds or smacking sounds from your baby while breastfeeding.  Nipple pain. If you think your baby has not latched on correctly, slip your finger into the corner of your baby's mouth to break the suction and place it between your baby's gums. Attempt breastfeeding initiation again. Signs of Successful Breastfeeding Signs from your baby:  A gradual decrease in the number of sucks or complete cessation of sucking.  Falling asleep.  Relaxation of his or her body.  Retention of a small amount of milk in his or her mouth.  Letting go of your breast by himself or herself. Signs from you:  Breasts that have increased in firmness, weight, and size 1-3 hours after feeding.  Breasts that are softer immediately after breastfeeding.  Increased milk volume, as well as a change in milk consistency and color by the fifth day of breastfeeding.  Nipples that are not sore, cracked, or bleeding. Signs That Your Pecola Leisure is Getting Enough Milk  Wetting at least 3 diapers in a 24-hour period. The urine should be clear and pale yellow by age 640 days.  At least 3 stools in a 24-hour period by age 640 days. The stool should be soft and yellow.  At least 3 stools  in a 24-hour period by age 70 days. The stool should be seedy and yellow.  No loss of weight greater than 10% of birth weight during the first 6 days of age.  Average weight gain of 4-7 ounces (113-198 g) per week after age 24 days.  Consistent daily weight gain by age 68 days, without weight loss after the age of 2 weeks. After a feeding, your baby may spit up a small amount. This is common. BREASTFEEDING  FREQUENCY AND DURATION Frequent feeding will help you make more milk and can prevent sore nipples and breast engorgement. Breastfeed when you feel the need to reduce the fullness of your breasts or when your baby shows signs of hunger. This is called "breastfeeding on demand." Avoid introducing a pacifier to your baby while you are working to establish breastfeeding (the first 4-6 weeks after your baby is born). After this time you may choose to use a pacifier. Research has shown that pacifier use during the first year of a baby's life decreases the risk of sudden infant death syndrome (SIDS). Allow your baby to feed on each breast as long as he or she wants. Breastfeed until your baby is finished feeding. When your baby unlatches or falls asleep while feeding from the first breast, offer the second breast. Because newborns are often sleepy in the first few weeks of life, you may need to awaken your baby to get him or her to feed. Breastfeeding times will vary from baby to baby. However, the following rules can serve as a guide to help you ensure that your baby is properly fed:  Newborns (babies 67 weeks of age or younger) may breastfeed every 1-3 hours.  Newborns should not go longer than 3 hours during the day or 5 hours during the night without breastfeeding.  You should breastfeed your baby a minimum of 8 times in a 24-hour period until you begin to introduce solid foods to your baby at around 78 months of age. BREAST MILK PUMPING Pumping and storing breast milk allows you to ensure that your baby is exclusively fed your breast milk, even at times when you are unable to breastfeed. This is especially important if you are going back to work while you are still breastfeeding or when you are not able to be present during feedings. Your lactation consultant can give you guidelines on how long it is safe to store breast milk. A breast pump is a machine that allows you to pump milk from your breast into a  sterile bottle. The pumped breast milk can then be stored in a refrigerator or freezer. Some breast pumps are operated by hand, while others use electricity. Ask your lactation consultant which type will work best for you. Breast pumps can be purchased, but some hospitals and breastfeeding support groups lease breast pumps on a monthly basis. A lactation consultant can teach you how to hand express breast milk, if you prefer not to use a pump. CARING FOR YOUR BREASTS WHILE YOU BREASTFEED Nipples can become dry, cracked, and sore while breastfeeding. The following recommendations can help keep your breasts moisturized and healthy:  Avoid using soap on your nipples.  Wear a supportive bra. Although not required, special nursing bras and tank tops are designed to allow access to your breasts for breastfeeding without taking off your entire bra or top. Avoid wearing underwire-style bras or extremely tight bras.  Air dry your nipples for 3-56minutes after each feeding.  Use only cotton bra pads to  absorb leaked breast milk. Leaking of breast milk between feedings is normal.  Use lanolin on your nipples after breastfeeding. Lanolin helps to maintain your skin's normal moisture barrier. If you use pure lanolin, you do not need to wash it off before feeding your baby again. Pure lanolin is not toxic to your baby. You may also hand express a few drops of breast milk and gently massage that milk into your nipples and allow the milk to air dry. In the first few weeks after giving birth, some women experience extremely full breasts (engorgement). Engorgement can make your breasts feel heavy, warm, and tender to the touch. Engorgement peaks within 3-5 days after you give birth. The following recommendations can help ease engorgement:  Completely empty your breasts while breastfeeding or pumping. You may want to start by applying warm, moist heat (in the shower or with warm water-soaked hand towels) just before  feeding or pumping. This increases circulation and helps the milk flow. If your baby does not completely empty your breasts while breastfeeding, pump any extra milk after he or she is finished.  Wear a snug bra (nursing or regular) or tank top for 1-2 days to signal your body to slightly decrease milk production.  Apply ice packs to your breasts, unless this is too uncomfortable for you.  Make sure that your baby is latched on and positioned properly while breastfeeding. If engorgement persists after 48 hours of following these recommendations, contact your health care provider or a Advertising copywriter. OVERALL HEALTH CARE RECOMMENDATIONS WHILE BREASTFEEDING  Eat healthy foods. Alternate between meals and snacks, eating 3 of each per day. Because what you eat affects your breast milk, some of the foods may make your baby more irritable than usual. Avoid eating these foods if you are sure that they are negatively affecting your baby.  Drink milk, fruit juice, and water to satisfy your thirst (about 10 glasses a day).  Rest often, relax, and continue to take your prenatal vitamins to prevent fatigue, stress, and anemia.  Continue breast self-awareness checks.  Avoid chewing and smoking tobacco. Chemicals from cigarettes that pass into breast milk and exposure to secondhand smoke may harm your baby.  Avoid alcohol and drug use, including marijuana. Some medicines that may be harmful to your baby can pass through breast milk. It is important to ask your health care provider before taking any medicine, including all over-the-counter and prescription medicine as well as vitamin and herbal supplements. It is possible to become pregnant while breastfeeding. If birth control is desired, ask your health care provider about options that will be safe for your baby. SEEK MEDICAL CARE IF:  You feel like you want to stop breastfeeding or have become frustrated with breastfeeding.  You have painful  breasts or nipples.  Your nipples are cracked or bleeding.  Your breasts are red, tender, or warm.  You have a swollen area on either breast.  You have a fever or chills.  You have nausea or vomiting.  You have drainage other than breast milk from your nipples.  Your breasts do not become full before feedings by the fifth day after you give birth.  You feel sad and depressed.  Your baby is too sleepy to eat well.  Your baby is having trouble sleeping.   Your baby is wetting less than 3 diapers in a 24-hour period.  Your baby has less than 3 stools in a 24-hour period.  Your baby's skin or the white part of his  or her eyes becomes yellow.   Your baby is not gaining weight by 68 days of age. SEEK IMMEDIATE MEDICAL CARE IF:  Your baby is overly tired (lethargic) and does not want to wake up and feed.  Your baby develops an unexplained fever.   This information is not intended to replace advice given to you by your health care provider. Make sure you discuss any questions you have with your health care provider.   Document Released: 12/17/2005 Document Revised: 09/07/2015 Document Reviewed: 06/10/2013 Elsevier Interactive Patient Education [employer]

## 2016-02-08 NOTE — Progress Notes (Signed)
Bedside ultrasound today measures [redacted]w[redacted]d fetus with heartbeat.  

## 2016-02-08 NOTE — Progress Notes (Signed)
Subjective:    Kim [name] is a [id number] [redacted]w[redacted]d being seen today for her first obstetrical visit.  Her obstetrical history is significant for short interval between pregnancy, previous cesarean section x3, h/o preeclampsia, tobacco smoker, h/o GDM. Patient does not intend to breast feed. Pregnancy history fully reviewed.  Patient reports nausea.  Filed Vitals:   02/08/16 1007  BP: 96/62  Pulse: 84  Weight: 104 lb (47.174 kg)    HISTORY: OB History  Gravida Para Term Preterm AB SAB TAB Ectopic Multiple Living  0 3    # Outcome Date GA Lbr Len/2nd Weight Sex Delivery Anes PTL Lv  5 Current           4 Term 07/03/15 [redacted]w[redacted]d  6 lb 14 oz (3.118 kg) M CS-LTranv Spinal  Y  3 Term 02/04/11    F CS-LTranv   Y     Complications: Pre-eclampsia  2 Term 01/12/09    M CS-LTranv EPI N Y     Complications: Fetal Intolerance  1 SAB              Past Medical History  Diagnosis Date  . Chest pain   . Depression   . Fibromyalgia   . Anemia   . Ulcer   . Rectal bleeding   . Asthma   . Gestational diabetes 2010, 2012  . Anxiety   . Heartburn during pregnancy   . Seizures (HCC) 2015    x 1- unknown reason- passed out   Past Surgical History  Procedure Laterality Date  . Cesarean section      x 2  . Wisdom tooth extraction    . Cesarean section with bilateral tubal ligation Bilateral 07/03/2015    Procedure: REPEAT CESAREAN SECTION ;  Surgeon: Tereso Newcomer, MD;  Location: [facility];  Service: Obstetrics;  Laterality: Bilateral;   Family History  Problem Relation Age of Onset  . Depression Mother   . Fibromyalgia Mother   . Diabetes Maternal Grandmother   . Hypertension Maternal Grandmother   . Arthritis Maternal Grandmother   . Thyroid disease Maternal Grandmother      Exam    Uterus:     Pelvic Exam:    Perineum: Normal Perineum   Vulva: normal   Vagina:  normal mucosa, normal discharge   pH:    Cervix: closed and long   Adnexa: normal adnexa and no mass,  fullness, tenderness   Bony Pelvis: gynecoid  System: Breast:  normal appearance, no masses or tenderness   Skin: normal coloration and turgor, no rashes    Neurologic: oriented, no focal deficits   Extremities: normal strength, tone, and muscle mass   HEENT extra ocular movement intact   Mouth/Teeth mucous membranes moist, pharynx normal without lesions and dental hygiene good   Neck supple and no masses   Cardiovascular: regular rate and rhythm   Respiratory:  chest clear, no wheezing, crepitations, rhonchi, normal symmetric air entry   Abdomen: soft, non-tender; bowel sounds normal; no masses,  no organomegaly   Urinary:       Assessment:    Pregnancy: [id number] Patient Active Problem List   Diagnosis Date Noted  . Asthma affecting pregnancy, antepartum 02/08/2016    Priority: Medium  . Short interval between pregnancies affecting pregnancy, antepartum 02/08/2016    Priority: Medium  . Previous cesarean section complicating pregnancy, antepartum condition or complication 12/14/2014    Priority: Medium  . H/O pre-eclampsia  in prior pregnancy, currently pregnant 12/14/2014    Priority: Medium  . Asthma, moderate persistent, well-controlled 12/14/2014    Priority: Medium  . Tobacco smoking affecting pregnancy, antepartum 12/14/2014    Priority: Medium  . Supervision of normal pregnancy 11/16/2014    Priority: Medium  . History of diabetes as a child and gestational diabetes 11/16/2014    Priority: Medium  . Bipolar depression (HCC) 12/14/2014  . Underweight 11/16/2014        Plan:     Initial labs drawn. Prenatal vitamins. Problem list reviewed and updated. Genetic Screening discussed : declined.  Ultrasound discussed; fetal survey: requested. Diclegis sample provided Baseline labs ordered due to h/o pre-eclampsia ASA Rx at next visit Smoking cessation counseling provided  Follow up in 4 weeks. 50% of 30 min visit spent on counseling and coordination of care.      Kim [name] 02/08/2016

## 2016-02-09 ENCOUNTER — Encounter: Payer: Self-pay | Admitting: *Deleted

## 2016-02-09 LAB — HEMOGLOBIN A1C
Hgb A1c MFr Bld: 5.7 % — ABNORMAL HIGH (ref <5.7)
Mean Plasma Glucose: 117 mg/dL — ABNORMAL HIGH (ref <117)

## 2016-02-09 LAB — PRENATAL PROFILE (SOLSTAS)
ANTIBODY SCREEN: NEGATIVE
Basophils Absolute: 0 10*3/uL (ref 0.0–0.1)
Basophils Relative: 0 % (ref 0–1)
EOS ABS: 0.2 10*3/uL (ref 0.0–0.7)
Eosinophils Relative: 3 % (ref 0–5)
HCT: 36 % (ref 36.0–46.0)
HEP B S AG: NEGATIVE
HIV: NONREACTIVE
Hemoglobin: 12.2 g/dL (ref 12.0–15.0)
LYMPHS ABS: 1.6 10*3/uL (ref 0.7–4.0)
LYMPHS PCT: 21 % (ref 12–46)
MCH: 29.8 pg (ref 26.0–34.0)
MCHC: 33.9 g/dL (ref 30.0–36.0)
MCV: 87.8 fL (ref 78.0–100.0)
MONOS PCT: 7 % (ref 3–12)
MPV: 10.6 fL (ref 8.6–12.4)
Monocytes Absolute: 0.5 10*3/uL (ref 0.1–1.0)
NEUTROS PCT: 69 % (ref 43–77)
Neutro Abs: 5.2 10*3/uL (ref 1.7–7.7)
PLATELETS: 239 10*3/uL (ref 150–400)
RBC: 4.1 MIL/uL (ref 3.87–5.11)
RDW: 13.4 % (ref 11.5–15.5)
RUBELLA: 4.2 {index} — AB (ref <0.90)
Rh Type: POSITIVE
WBC: 7.6 10*3/uL (ref 4.0–10.5)

## 2016-02-09 LAB — URINE CYTOLOGY ANCILLARY ONLY
CHLAMYDIA, DNA PROBE: NEGATIVE
Neisseria Gonorrhea: NEGATIVE

## 2016-02-10 LAB — CULTURE, OB URINE

## 2016-02-13 MED ORDER — CEPHALEXIN 500 MG PO CAPS: 500.0000 mg | ORAL_CAPSULE | Freq: Four times a day (QID) | ORAL | Status: DC

## 2016-02-13 NOTE — Addendum Note (Signed)
Addended by: Catalina Antigua on: 02/13/2016 08:45 AM   Modules accepted: Orders

## 2016-03-07 ENCOUNTER — Ambulatory Visit (INDEPENDENT_AMBULATORY_CARE_PROVIDER_SITE_OTHER): Payer: Medicaid Other | Admitting: Family Medicine

## 2016-03-07 VITALS — BP 102/64 | HR 102 | Wt 105.0 lb

## 2016-03-07 DIAGNOSIS — F329 Major depressive disorder, single episode, unspecified: Secondary | ICD-10-CM

## 2016-03-07 DIAGNOSIS — Z23 Encounter for immunization: Secondary | ICD-10-CM

## 2016-03-07 DIAGNOSIS — J4540 Moderate persistent asthma, uncomplicated: Secondary | ICD-10-CM

## 2016-03-07 DIAGNOSIS — F419 Anxiety disorder, unspecified: Principal | ICD-10-CM

## 2016-03-07 DIAGNOSIS — Z3491 Encounter for supervision of normal pregnancy, unspecified, first trimester: Secondary | ICD-10-CM

## 2016-03-07 DIAGNOSIS — O09291 Supervision of pregnancy with other poor reproductive or obstetric history, first trimester: Secondary | ICD-10-CM

## 2016-03-07 DIAGNOSIS — Z3481 Encounter for supervision of other normal pregnancy, first trimester: Secondary | ICD-10-CM

## 2016-03-07 DIAGNOSIS — F418 Other specified anxiety disorders: Secondary | ICD-10-CM

## 2016-03-07 MED ORDER — ALPRAZOLAM 0.5 MG PO TABS: ORAL_TABLET | ORAL | Status: DC

## 2016-03-07 MED ORDER — BECLOMETHASONE DIPROPIONATE 40 MCG/ACT IN AERS: 2.0000 | INHALATION_SPRAY | Freq: Two times a day (BID) | RESPIRATORY_TRACT | Status: DC

## 2016-03-07 MED ORDER — ASPIRIN 81 MG PO CHEW: 81.0000 mg | CHEWABLE_TABLET | Freq: Every day | ORAL | Status: DC

## 2016-03-07 NOTE — Patient Instructions (Signed)
 First Trimester of Pregnancy The first trimester of pregnancy is from week 1 until the end of week 12 (months 1 through 3). A week after a sperm fertilizes an egg, the egg will implant on the wall of the uterus. This embryo will begin to develop into a baby. Genes from you and your partner are forming the baby. The female genes determine whether the baby is a boy or a girl. At 6-8 weeks, the eyes and face are formed, and the heartbeat can be seen on ultrasound. At the end of 12 weeks, all the baby's organs are formed.  Now that you are pregnant, you will want to do everything you can to have a healthy baby. Two of the most important things are to get good prenatal care and to follow your health care provider's instructions. Prenatal care is all the medical care you receive before the baby's birth. This care will help prevent, find, and treat any problems during the pregnancy and childbirth. BODY CHANGES Your body goes through many changes during pregnancy. The changes vary from woman to woman.   You may gain or lose a couple of pounds at first.  You may feel sick to your stomach (nauseous) and throw up (vomit). If the vomiting is uncontrollable, call your health care provider.  You may tire easily.  You may develop headaches that can be relieved by medicines approved by your health care provider.  You may urinate more often. Painful urination may mean you have a bladder infection.  You may develop heartburn as a result of your pregnancy.  You may develop constipation because certain hormones are causing the muscles that push waste through your intestines to slow down.  You may develop hemorrhoids or swollen, bulging veins (varicose veins).  Your breasts may begin to grow larger and become tender. Your nipples may stick out more, and the tissue that surrounds them (areola) may become darker.  Your gums may bleed and may be sensitive to brushing and flossing.  Dark spots or blotches  (chloasma, mask of pregnancy) may develop on your face. This will likely fade after the baby is born.  Your menstrual periods will stop.  You may have a loss of appetite.  You may develop cravings for certain kinds of food.  You may have changes in your emotions from day to day, such as being excited to be pregnant or being concerned that something may go wrong with the pregnancy and baby.  You may have more vivid and strange dreams.  You may have changes in your hair. These can include thickening of your hair, rapid growth, and changes in texture. Some women also have hair loss during or after pregnancy, or hair that feels dry or thin. Your hair will most likely return to normal after your baby is born. WHAT TO EXPECT AT YOUR PRENATAL VISITS During a routine prenatal visit:  You will be weighed to make sure you and the baby are growing normally.  Your blood pressure will be taken.  Your abdomen will be measured to track your baby's growth.  The fetal heartbeat will be listened to starting around week 10 or 12 of your pregnancy.  Test results from any previous visits will be discussed. Your health care provider may ask you:  How you are feeling.  If you are feeling the baby move.  If you have had any abnormal symptoms, such as leaking fluid, bleeding, severe headaches, or abdominal cramping.  If you are using any tobacco   products, including cigarettes, chewing tobacco, and electronic cigarettes.  If you have any questions. Other tests that may be performed during your first trimester include:  Blood tests to find your blood type and to check for the presence of any previous infections. They will also be used to check for low iron levels (anemia) and Rh antibodies. Later in the pregnancy, blood tests for diabetes will be done along with other tests if problems develop.  Urine tests to check for infections, diabetes, or protein in the urine.  An ultrasound to confirm the  proper growth and development of the baby.  An amniocentesis to check for possible genetic problems.  Fetal screens for spina bifida and Down syndrome.  You may need other tests to make sure you and the baby are doing well.  HIV (human immunodeficiency virus) testing. Routine prenatal testing includes screening for HIV, unless you choose not to have this test. HOME CARE INSTRUCTIONS  Medicines  Follow your health care provider's instructions regarding medicine use. Specific medicines may be either safe or unsafe to take during pregnancy.  Take your prenatal vitamins as directed.  If you develop constipation, try taking a stool softener if your health care provider approves. Diet  Eat regular, well-balanced meals. Choose a variety of foods, such as meat or vegetable-based protein, fish, milk and low-fat dairy products, vegetables, fruits, and whole grain breads and cereals. Your health care provider will help you determine the amount of weight gain that is right for you.  Avoid raw meat and uncooked cheese. These carry germs that can cause birth defects in the baby.  Eating four or five small meals rather than three large meals a day may help relieve nausea and vomiting. If you start to feel nauseous, eating a few soda crackers can be helpful. Drinking liquids between meals instead of during meals also seems to help nausea and vomiting.  If you develop constipation, eat more high-fiber foods, such as fresh vegetables or fruit and whole grains. Drink enough fluids to keep your urine clear or pale yellow. Activity and Exercise  Exercise only as directed by your health care provider. Exercising will help you:  Control your weight.  Stay in shape.  Be prepared for labor and delivery.  Experiencing pain or cramping in the lower abdomen or low back is a good sign that you should stop exercising. Check with your health care provider before continuing normal exercises.  Try to avoid  standing for long periods of time. Move your legs often if you must stand in one place for a long time.  Avoid heavy lifting.  Wear low-heeled shoes, and practice good posture.  You may continue to have sex unless your health care provider directs you otherwise. Relief of Pain or Discomfort  Wear a good support bra for breast tenderness.   Take warm sitz baths to soothe any pain or discomfort caused by hemorrhoids. Use hemorrhoid cream if your health care provider approves.   Rest with your legs elevated if you have leg cramps or low back pain.  If you develop varicose veins in your legs, wear support hose. Elevate your feet for 15 minutes, 3-4 times a day. Limit salt in your diet. Prenatal Care  Schedule your prenatal visits by the twelfth week of pregnancy. They are usually scheduled monthly at first, then more often in the last 2 months before delivery.  Write down your questions. Take them to your prenatal visits.  Keep all your prenatal visits as directed by   your health care provider. Safety  Wear your seat belt at all times when driving.  Make a list of emergency phone numbers, including numbers for family, friends, the hospital, and police and fire departments. General Tips  Ask your health care provider for a referral to a local prenatal education class. Begin classes no later than at the beginning of month 6 of your pregnancy.  Ask for help if you have counseling or nutritional needs during pregnancy. Your health care provider can offer advice or refer you to specialists for help with various needs.  Do not use hot tubs, steam rooms, or saunas.  Do not douche or use tampons or scented sanitary pads.  Do not cross your legs for long periods of time.  Avoid cat litter boxes and soil used by cats. These carry germs that can cause birth defects in the baby and possibly loss of the fetus by miscarriage or stillbirth.  Avoid all smoking, herbs, alcohol, and medicines  not prescribed by your health care provider. Chemicals in these affect the formation and growth of the baby.  Do not use any tobacco products, including cigarettes, chewing tobacco, and electronic cigarettes. If you need help quitting, ask your health care provider. You may receive counseling support and other resources to help you quit.  Schedule a dentist appointment. At home, brush your teeth with a soft toothbrush and be gentle when you floss. SEEK MEDICAL CARE IF:   You have dizziness.  You have mild pelvic cramps, pelvic pressure, or nagging pain in the abdominal area.  You have persistent nausea, vomiting, or diarrhea.  You have a bad smelling vaginal discharge.  You have pain with urination.  You notice increased swelling in your face, hands, legs, or ankles. SEEK IMMEDIATE MEDICAL CARE IF:   You have a fever.  You are leaking fluid from your vagina.  You have spotting or bleeding from your vagina.  You have severe abdominal cramping or pain.  You have rapid weight gain or loss.  You vomit blood or material that looks like coffee grounds.  You are exposed to German measles and have never had them.  You are exposed to fifth disease or chickenpox.  You develop a severe headache.  You have shortness of breath.  You have any kind of trauma, such as from a fall or a car accident.   This information is not intended to replace advice given to you by your health care provider. Make sure you discuss any questions you have with your health care provider.   Document Released: 12/11/2001 Document Revised: 01/07/2015 Document Reviewed: 10/27/2013 [employer] Interactive Patient Education 2016 [employer]   Breastfeeding Deciding to breastfeed is one of the best choices you can make for you and your baby. A change in hormones during pregnancy causes your breast tissue to grow and increases the number and size of your milk ducts. These hormones also allow proteins, sugars,  and fats from your blood supply to make breast milk in your milk-producing glands. Hormones prevent breast milk from being released before your baby is born as well as prompt milk flow after birth. Once breastfeeding has begun, thoughts of your baby, as well as his or her sucking or crying, can stimulate the release of milk from your milk-producing glands.  BENEFITS OF BREASTFEEDING For Your Baby  Your first milk (colostrum) helps your baby's digestive system function better.  There are antibodies in your milk that help your baby fight off infections.  Your baby has   a lower incidence of asthma, allergies, and sudden infant death syndrome.  The nutrients in breast milk are better for your baby than infant formulas and are designed uniquely for your baby's needs.  Breast milk improves your baby's brain development.  Your baby is less likely to develop other conditions, such as childhood obesity, asthma, or type 2 diabetes mellitus. For You  Breastfeeding helps to create a very special bond between you and your baby.  Breastfeeding is convenient. Breast milk is always available at the correct temperature and costs nothing.  Breastfeeding helps to burn calories and helps you lose the weight gained during pregnancy.  Breastfeeding makes your uterus contract to its prepregnancy size faster and slows bleeding (lochia) after you give birth.   Breastfeeding helps to lower your risk of developing type 2 diabetes mellitus, osteoporosis, and breast or ovarian cancer later in life. SIGNS THAT YOUR BABY IS HUNGRY Early Signs of Hunger  Increased alertness or activity.  Stretching.  Movement of the head from side to side.  Movement of the head and opening of the mouth when the corner of the mouth or cheek is stroked (rooting).  Increased sucking sounds, smacking lips, cooing, sighing, or squeaking.  Hand-to-mouth movements.  Increased sucking of fingers or hands. Late Signs of  Hunger  Fussing.  Intermittent crying. Extreme Signs of Hunger Signs of extreme hunger will require calming and consoling before your baby will be able to breastfeed successfully. Do not wait for the following signs of extreme hunger to occur before you initiate breastfeeding:  Restlessness.  A loud, strong cry.  Screaming. BREASTFEEDING BASICS Breastfeeding Initiation  Find a comfortable place to sit or lie down, with your neck and back well supported.  Place a pillow or rolled up blanket under your baby to bring him or her to the level of your breast (if you are seated). Nursing pillows are specially designed to help support your arms and your baby while you breastfeed.  Make sure that your baby's abdomen is facing your abdomen.  Gently massage your breast. With your fingertips, massage from your chest wall toward your nipple in a circular motion. This encourages milk flow. You may need to continue this action during the feeding if your milk flows slowly.  Support your breast with 4 fingers underneath and your thumb above your nipple. Make sure your fingers are well away from your nipple and your baby's mouth.  Stroke your baby's lips gently with your finger or nipple.  When your baby's mouth is open wide enough, quickly bring your baby to your breast, placing your entire nipple and as much of the colored area around your nipple (areola) as possible into your baby's mouth.  More areola should be visible above your baby's upper lip than below the lower lip.  Your baby's tongue should be between his or her lower gum and your breast.  Ensure that your baby's mouth is correctly positioned around your nipple (latched). Your baby's lips should create a seal on your breast and be turned out (everted).  It is common for your baby to suck about 2-3 minutes in order to start the flow of breast milk. Latching Teaching your baby how to latch on to your breast properly is very important.  An improper latch can cause nipple pain and decreased milk supply for you and poor weight gain in your baby. Also, if your baby is not latched onto your nipple properly, he or she may swallow some air during feeding. This   can make your baby fussy. Burping your baby when you switch breasts during the feeding can help to get rid of the air. However, teaching your baby to latch on properly is still the best way to prevent fussiness from swallowing air while breastfeeding. Signs that your baby has successfully latched on to your nipple:  Silent tugging or silent sucking, without causing you pain.  Swallowing heard between every 3-4 sucks.  Muscle movement above and in front of his or her ears while sucking. Signs that your baby has not successfully latched on to nipple:  Sucking sounds or smacking sounds from your baby while breastfeeding.  Nipple pain. If you think your baby has not latched on correctly, slip your finger into the corner of your baby's mouth to break the suction and place it between your baby's gums. Attempt breastfeeding initiation again. Signs of Successful Breastfeeding Signs from your baby:  A gradual decrease in the number of sucks or complete cessation of sucking.  Falling asleep.  Relaxation of his or her body.  Retention of a small amount of milk in his or her mouth.  Letting go of your breast by himself or herself. Signs from you:  Breasts that have increased in firmness, weight, and size 1-3 hours after feeding.  Breasts that are softer immediately after breastfeeding.  Increased milk volume, as well as a change in milk consistency and color by the fifth day of breastfeeding.  Nipples that are not sore, cracked, or bleeding. Signs That Your Baby is Getting Enough Milk  Wetting at least 3 diapers in a 24-hour period. The urine should be clear and pale yellow by age 5 days.  At least 3 stools in a 24-hour period by age 5 days. The stool should be soft and  yellow.  At least 3 stools in a 24-hour period by age 7 days. The stool should be seedy and yellow.  No loss of weight greater than 10% of birth weight during the first 3 days of age.  Average weight gain of 4-7 ounces (113-198 g) per week after age 4 days.  Consistent daily weight gain by age 5 days, without weight loss after the age of 2 weeks. After a feeding, your baby may spit up a small amount. This is common. BREASTFEEDING FREQUENCY AND DURATION Frequent feeding will help you make more milk and can prevent sore nipples and breast engorgement. Breastfeed when you feel the need to reduce the fullness of your breasts or when your baby shows signs of hunger. This is called "breastfeeding on demand." Avoid introducing a pacifier to your baby while you are working to establish breastfeeding (the first 4-6 weeks after your baby is born). After this time you may choose to use a pacifier. Research has shown that pacifier use during the first year of a baby's life decreases the risk of sudden infant death syndrome (SIDS). Allow your baby to feed on each breast as long as he or she wants. Breastfeed until your baby is finished feeding. When your baby unlatches or falls asleep while feeding from the first breast, offer the second breast. Because newborns are often sleepy in the first few weeks of life, you may need to awaken your baby to get him or her to feed. Breastfeeding times will vary from baby to baby. However, the following rules can serve as a guide to help you ensure that your baby is properly fed:  Newborns (babies 4 weeks of age or younger) may breastfeed every 1-3 hours.    Newborns should not go longer than 3 hours during the day or 5 hours during the night without breastfeeding.  You should breastfeed your baby a minimum of 8 times in a 24-hour period until you begin to introduce solid foods to your baby at around 6 months of age. BREAST MILK PUMPING Pumping and storing breast milk  allows you to ensure that your baby is exclusively fed your breast milk, even at times when you are unable to breastfeed. This is especially important if you are going back to work while you are still breastfeeding or when you are not able to be present during feedings. Your lactation consultant can give you guidelines on how long it is safe to store breast milk. A breast pump is a machine that allows you to pump milk from your breast into a sterile bottle. The pumped breast milk can then be stored in a refrigerator or freezer. Some breast pumps are operated by hand, while others use electricity. Ask your lactation consultant which type will work best for you. Breast pumps can be purchased, but some hospitals and breastfeeding support groups lease breast pumps on a monthly basis. A lactation consultant can teach you how to hand express breast milk, if you prefer not to use a pump. CARING FOR YOUR BREASTS WHILE YOU BREASTFEED Nipples can become dry, cracked, and sore while breastfeeding. The following recommendations can help keep your breasts moisturized and healthy:  Avoid using soap on your nipples.  Wear a supportive bra. Although not required, special nursing bras and tank tops are designed to allow access to your breasts for breastfeeding without taking off your entire bra or top. Avoid wearing underwire-style bras or extremely tight bras.  Air dry your nipples for 3-4minutes after each feeding.  Use only cotton bra pads to absorb leaked breast milk. Leaking of breast milk between feedings is normal.  Use lanolin on your nipples after breastfeeding. Lanolin helps to maintain your skin's normal moisture barrier. If you use pure lanolin, you do not need to wash it off before feeding your baby again. Pure lanolin is not toxic to your baby. You may also hand express a few drops of breast milk and gently massage that milk into your nipples and allow the milk to air dry. In the first few weeks after  giving birth, some women experience extremely full breasts (engorgement). Engorgement can make your breasts feel heavy, warm, and tender to the touch. Engorgement peaks within 3-5 days after you give birth. The following recommendations can help ease engorgement:  Completely empty your breasts while breastfeeding or pumping. You may want to start by applying warm, moist heat (in the shower or with warm water-soaked hand towels) just before feeding or pumping. This increases circulation and helps the milk flow. If your baby does not completely empty your breasts while breastfeeding, pump any extra milk after he or she is finished.  Wear a snug bra (nursing or regular) or tank top for 1-2 days to signal your body to slightly decrease milk production.  Apply ice packs to your breasts, unless this is too uncomfortable for you.  Make sure that your baby is latched on and positioned properly while breastfeeding. If engorgement persists after 48 hours of following these recommendations, contact your health care provider or a lactation consultant. OVERALL HEALTH CARE RECOMMENDATIONS WHILE BREASTFEEDING  Eat healthy foods. Alternate between meals and snacks, eating 3 of each per day. Because what you eat affects your breast milk, some of the foods   may make your baby more irritable than usual. Avoid eating these foods if you are sure that they are negatively affecting your baby.  Drink milk, fruit juice, and water to satisfy your thirst (about 10 glasses a day).  Rest often, relax, and continue to take your prenatal vitamins to prevent fatigue, stress, and anemia.  Continue breast self-awareness checks.  Avoid chewing and smoking tobacco. Chemicals from cigarettes that pass into breast milk and exposure to secondhand smoke may harm your baby.  Avoid alcohol and drug use, including marijuana. Some medicines that may be harmful to your baby can pass through breast milk. It is important to ask your health  care provider before taking any medicine, including all over-the-counter and prescription medicine as well as vitamin and herbal supplements. It is possible to become pregnant while breastfeeding. If birth control is desired, ask your health care provider about options that will be safe for your baby. SEEK MEDICAL CARE IF:  You feel like you want to stop breastfeeding or have become frustrated with breastfeeding.  You have painful breasts or nipples.  Your nipples are cracked or bleeding.  Your breasts are red, tender, or warm.  You have a swollen area on either breast.  You have a fever or chills.  You have nausea or vomiting.  You have drainage other than breast milk from your nipples.  Your breasts do not become full before feedings by the fifth day after you give birth.  You feel sad and depressed.  Your baby is too sleepy to eat well.  Your baby is having trouble sleeping.   Your baby is wetting less than 3 diapers in a 24-hour period.  Your baby has less than 3 stools in a 24-hour period.  Your baby's skin or the white part of his or her eyes becomes yellow.   Your baby is not gaining weight by 5 days of age. SEEK IMMEDIATE MEDICAL CARE IF:  Your baby is overly tired (lethargic) and does not want to wake up and feed.  Your baby develops an unexplained fever.   This information is not intended to replace advice given to you by your health care provider. Make sure you discuss any questions you have with your health care provider.   Document Released: 12/17/2005 Document Revised: 09/07/2015 Document Reviewed: 06/10/2013 [employer] Interactive Patient Education 2016 [employer]  

## 2016-03-07 NOTE — Progress Notes (Signed)
Subjective:  Kim [name] is a 26 y.o. [id number] at 6876w0d being seen today for ongoing prenatal care.  She is currently monitored for the following issues for this low-risk pregnancy and has Supervision of normal pregnancy; History of diabetes as a child and gestational diabetes; Underweight; Previous cesarean section complicating pregnancy, antepartum condition or complication; H/O pre-eclampsia in prior pregnancy, currently pregnant; Asthma, moderate persistent, well-controlled; Tobacco smoking affecting pregnancy, antepartum; Bipolar depression (HCC); Asthma affecting pregnancy, antepartum; and Short interval between pregnancies affecting pregnancy, antepartum on her problem list.  Patient reports wheezing and needing to hit her inhaler > 2x/wk. Also, needs refill of xanax--takes 1/day.  Contractions: Not present. Vag. Bleeding: None.  Movement: Absent. Denies leaking of fluid.   The following portions of the patient's history were reviewed and updated as appropriate: allergies, current medications, past family history, past medical history, past social history, past surgical history and problem list. Problem list updated.  Objective:   Filed Vitals:   03/07/16 0917  BP: 102/64  Pulse: 102  Weight: 105 lb (47.628 kg)    Fetal Status: Fetal Heart Rate (bpm): 157   Movement: Absent     General:  Alert, oriented and cooperative. Patient is in no acute distress.  Skin: Skin is warm and dry. No rash noted.   Cardiovascular: Normal heart rate noted  Respiratory: Normal respiratory effort, no problems with respiration noted  Abdomen: Soft, gravid, appropriate for gestational age. Pain/Pressure: Absent     Pelvic: Vag. Bleeding: None Vag D/C Character: Thin   Cervical exam deferred        Extremities: Normal range of motion.  Edema: None  Mental Status: Normal mood and affect. Normal behavior. Normal judgment and thought content.    Assessment and Plan:  Pregnancy: [id number] at 8376w0d  1.  Anxiety and depression - ALPRAZolam (XANAX) 0.5 MG tablet; Take once a day by mouth as needed  Dispense: 30 tablet; Refill: 3  2. Supervision of normal pregnancy, first trimester Continue routine prenatal care. Begin Baby scripts - Flu Vaccine QUAD 36+ mos IM - US MFM OB COMP + 14 WK; Future  3. Asthma, moderate persistent, well-controlled Add inhaled steroid - beclomethasone (QVAR) 40 MCG/ACT inhaler; Inhale 2 puffs into the lungs 2 (two) times daily.  Dispense: 1 Inhaler; Refill: 12  4. H/O pre-eclampsia in prior pregnancy, currently pregnant, first trimester Begin baby ASA - aspirin 81 MG chewable tablet; Chew 1 tablet (81 mg total) by mouth daily.  Dispense: 90 tablet; Refill: 3  General obstetric precautions including but not limited to vaginal bleeding, contractions, leaking of fluid and fetal movement were reviewed in detail with the patient. Please refer to After Visit Summary for other counseling recommendations.  Return in about 8 weeks (around 05/02/2016).   Reva Boresanya S Pratt, MD

## 2016-03-07 NOTE — Progress Notes (Signed)
No c/o's except stating she is having more trouble with her breathing since last visit.  She does have a rescue inhaler

## 2016-04-04 ENCOUNTER — Encounter (HOSPITAL_COMMUNITY): Payer: Self-pay | Admitting: *Deleted

## 2016-04-04 ENCOUNTER — Inpatient Hospital Stay (HOSPITAL_COMMUNITY)
Admission: AD | Admit: 2016-04-04 | Discharge: 2016-04-04 | Disposition: A | Discharge status: Home / Self Care | Payer: Medicaid Other | Source: Ambulatory Visit | Attending: Obstetrics & Gynecology | Admitting: Obstetrics & Gynecology

## 2016-04-04 ENCOUNTER — Telehealth: Payer: Self-pay | Admitting: *Deleted

## 2016-04-04 DIAGNOSIS — O98812 Other maternal infectious and parasitic diseases complicating pregnancy, second trimester: Secondary | ICD-10-CM | POA: Diagnosis not present

## 2016-04-04 DIAGNOSIS — Z79899 Other long term (current) drug therapy: Secondary | ICD-10-CM | POA: Insufficient documentation

## 2016-04-04 DIAGNOSIS — Z3A17 17 weeks gestation of pregnancy: Secondary | ICD-10-CM | POA: Insufficient documentation

## 2016-04-04 DIAGNOSIS — O26892 Other specified pregnancy related conditions, second trimester: Secondary | ICD-10-CM | POA: Diagnosis present

## 2016-04-04 DIAGNOSIS — N898 Other specified noninflammatory disorders of vagina: Secondary | ICD-10-CM | POA: Insufficient documentation

## 2016-04-04 DIAGNOSIS — J45909 Unspecified asthma, uncomplicated: Secondary | ICD-10-CM | POA: Diagnosis not present

## 2016-04-04 DIAGNOSIS — M797 Fibromyalgia: Secondary | ICD-10-CM | POA: Diagnosis not present

## 2016-04-04 DIAGNOSIS — F329 Major depressive disorder, single episode, unspecified: Secondary | ICD-10-CM | POA: Insufficient documentation

## 2016-04-04 DIAGNOSIS — F17210 Nicotine dependence, cigarettes, uncomplicated: Secondary | ICD-10-CM | POA: Insufficient documentation

## 2016-04-04 DIAGNOSIS — O99332 Smoking (tobacco) complicating pregnancy, second trimester: Secondary | ICD-10-CM | POA: Diagnosis not present

## 2016-04-04 DIAGNOSIS — B373 Candidiasis of vulva and vagina: Secondary | ICD-10-CM | POA: Diagnosis not present

## 2016-04-04 DIAGNOSIS — F419 Anxiety disorder, unspecified: Secondary | ICD-10-CM | POA: Diagnosis not present

## 2016-04-04 DIAGNOSIS — Z202 Contact with and (suspected) exposure to infections with a predominantly sexual mode of transmission: Secondary | ICD-10-CM

## 2016-04-04 DIAGNOSIS — B3731 Acute candidiasis of vulva and vagina: Secondary | ICD-10-CM

## 2016-04-04 DIAGNOSIS — K625 Hemorrhage of anus and rectum: Secondary | ICD-10-CM | POA: Diagnosis not present

## 2016-04-04 DIAGNOSIS — Z7982 Long term (current) use of aspirin: Secondary | ICD-10-CM | POA: Insufficient documentation

## 2016-04-04 DIAGNOSIS — Z7951 Long term (current) use of inhaled steroids: Secondary | ICD-10-CM | POA: Diagnosis not present

## 2016-04-04 LAB — URINE MICROSCOPIC-ADD ON

## 2016-04-04 LAB — CBC
HCT: 29.2 % — ABNORMAL LOW (ref 36.0–46.0)
Hemoglobin: 10.2 g/dL — ABNORMAL LOW (ref 12.0–15.0)
MCH: 30.1 pg (ref 26.0–34.0)
MCHC: 34.9 g/dL (ref 30.0–36.0)
MCV: 86.1 fL (ref 78.0–100.0)
PLATELETS: 196 10*3/uL (ref 150–400)
RBC: 3.39 MIL/uL — AB (ref 3.87–5.11)
RDW: 13.7 % (ref 11.5–15.5)
WBC: 7.1 10*3/uL (ref 4.0–10.5)

## 2016-04-04 LAB — URINALYSIS, ROUTINE W REFLEX MICROSCOPIC
Bilirubin Urine: NEGATIVE
Glucose, UA: NEGATIVE mg/dL
Ketones, ur: NEGATIVE mg/dL
NITRITE: NEGATIVE
Protein, ur: NEGATIVE mg/dL
pH: 6 (ref 5.0–8.0)

## 2016-04-04 LAB — WET PREP, GENITAL
CLUE CELLS WET PREP: NONE SEEN
SPERM: NONE SEEN
TRICH WET PREP: NONE SEEN
YEAST WET PREP: NONE SEEN

## 2016-04-04 MED ORDER — CEFTRIAXONE SODIUM 250 MG IJ SOLR
250.0000 mg | Freq: Once | INTRAMUSCULAR | Status: AC
  Administered 2016-04-04: 250 mg via INTRAMUSCULAR
  Filled 2016-04-04: qty 250

## 2016-04-04 MED ORDER — ONDANSETRON 8 MG PO TBDP
8.0000 mg | ORAL_TABLET | Freq: Once | ORAL | Status: AC
  Administered 2016-04-04: 8 mg via ORAL
  Filled 2016-04-04: qty 1

## 2016-04-04 MED ORDER — TERCONAZOLE 0.4 % VA CREA: 1.0000 | TOPICAL_CREAM | Freq: Every day | VAGINAL | Status: DC

## 2016-04-04 MED ORDER — METRONIDAZOLE 500 MG PO TABS
2000.0000 mg | ORAL_TABLET | Freq: Once | ORAL | Status: AC
  Administered 2016-04-04: 2000 mg via ORAL
  Filled 2016-04-04: qty 4

## 2016-04-04 MED ORDER — AZITHROMYCIN 250 MG PO TABS
1000.0000 mg | ORAL_TABLET | Freq: Once | ORAL | Status: AC
  Administered 2016-04-04: 1000 mg via ORAL
  Filled 2016-04-04: qty 4

## 2016-04-04 NOTE — Discharge Instructions (Signed)
Pregnancy and Sexually Transmitted Diseases °A sexually transmitted disease (STD) is a disease or infection that may be passed (transmitted) from person to person, usually during sexual activity. This may happen by way of saliva, semen, blood, vaginal mucus, or urine. An STD can be caused by bacteria, viruses, or parasites.  °During pregnancy, STDs can be dangerous for both you and your unborn baby. It is important to take steps to reduce your chances of getting an STD. Also, you need to be looked at by your health care provider right away if you think you may have an STD or may have been exposed to an STD. Diagnosis and treatment will depend on the type of STD. °If you are already pregnant, you will be screened for HIV (human immunodeficiency virus) early in your pregnancy. If you are at high risk for HIV, this test may be repeated during your third trimester of pregnancy. °WHAT ARE SOME COMMON STDs? °There are different types of STDs. Some STDs that cause problems in pregnancy include: °· Gonorrhea.   °· Chlamydia.   °· Syphilis.   °· HIV and AIDS.   °· Genital herpes.   °· Hepatitis.   °· Genital warts.   °· Human papillomavirus (HPV). °STDs that do not affect the baby include:  °· Trichomonas.   °· Pubic lice.   °WHAT ARE THE POSSIBLE EFFECTS OF STDs DURING PREGNANCY? °STDs can have various effects during pregnancy. STDs can cause:  °· Stillbirth.   °· Miscarriage.   °· Premature labor.   °· Premature rupture of the membranes.   °· Serious birth defects or deformities.   °· Infection of the amniotic sac.   °· Infections that occur after birth (postpartum) in you and the baby.   °· Slowed growth of the baby before birth.   °· Illnesses in newborns.   °WHAT ARE COMMON SYMPTOMS OF STDs? °Different STDs have different symptoms. Some women may not have any symptoms. If symptoms are present, they may include: °· Painful or bloody urination.   °· Pain in the pelvis, abdomen, vagina, anus, throat, or eyes. °· A skin  rash, itching, or irritation.   °· Growths, ulcerations, blisters, or sores in the genital and anal areas.   °· Fever.   °· Abnormal vaginal discharge with or without bad odor.   °· Pain or bleeding during sexual intercourse.   °· Yellow skin and eyes (jaundice). This is seen with hepatitis.   °· Swollen glands in the groin area.   °Even if symptoms are not present, an STD can still be passed to another person during sexual contact.  °HOW ARE STDs DIAGNOSED? °Your health care provider can determine if you have an STD through different tests. These can include blood tests, urine tests, and tests performed during a pelvic exam. You should be screened for sexually transmitted illnesses (STIs), including gonorrhea and chlamydia if:  °· You are sexually active and are younger than 28 years old. °· You are older than 28 years old and your health care provider tells you that you are at risk for this type of infection. °· Your sexual activity has changed since you were last screened, and you are at an increased risk for chlamydia or gonorrhea. Ask your health care provider if you are at risk. °HOW CAN I REDUCE MY RISK OF GETTING AN STD?  °Take these steps to reduce your risk of getting an STD: °· Use a latex condom or female condom during sexual intercourse.   °· Use dental dams and water-soluble lubricants during sexual activity. Do not use petroleum jelly or oils. °· Avoid having multiple sex partners. °· Do not have sex   with someone who has other sex partners. °· Do not have sex with anyone you do not know or who is at high risk for an STD.   °· Avoid risky sex acts that can break the skin. °· Do not have sex if you have open sores on your mouth or skin. °· Avoid engaging in oral and anal sex acts.   °· Get the hepatitis vaccine. It is safe for pregnant women.   °WHAT SHOULD I DO IF I THINK I HAVE AN STD? °· See your health care provider. °· Tell your sexual partner(s). They should be tested and treated for any  STDs. °· Do not have sex until your health care provider says it is okay. °WHEN SHOULD I GET IMMEDIATE MEDICAL CARE? °Contact your health care provider right away if:  °· You have any symptoms of an STD. °· You think you or your sex partner has an STD, even if there are no symptoms. °· You think you may have been exposed to an STD. °  °This information is not intended to replace advice given to you by your health care provider. Make sure you discuss any questions you have with your health care provider. °  °Document Released: 01/24/2005 Document Revised: 01/07/2015 Document Reviewed: 07/16/2013 °Elsevier Interactive Patient Education ©2016 [employer]vier Inc. ° °

## 2016-04-04 NOTE — Telephone Encounter (Signed)
Referral to GI complete, [place].

## 2016-04-04 NOTE — Telephone Encounter (Signed)
-----   Message from Hurshel PartyLisa A Leftwich-Kirby, CNM sent at 04/04/2016 11:06 AM EDT ----- Regarding: OB pt needs referral to GI Contact: [phone] I am seeing this [id number] @ 6224w2d pt of [facility] in [facility] today for STD testing/treatment but she also reports an episode of rectal bleeding with watery stool. She has hx of colon ulcers with hospital admission in 2008.  She is stable today, but I think she might need a referral to GI. Can you guys get her an appointment or at least a place to call and let her know?  She has Medicaid but not sure if it is pregnancy Medicaid only.  Thank you.

## 2016-04-04 NOTE — MAU Provider Note (Signed)
Chief Complaint: No chief complaint on file.   First Provider Initiated Contact with Patient 04/04/16 1032      SUBJECTIVE HPI: Kim [name] is a 26 y.o. [id number] at [redacted]w[redacted]d by LMP pt of [facility] who presents to maternity admissions reporting vaginal itching/burning x 1 week with white discharge starting today, rectal bleeding, and possible exposure to STD.   She reports she thought she had a yeast infection, which are common for her in pregnancy, but then she received a phone call from a woman today who stated she has an STD and has recently had sex with the patient's husband.  The patient is not sure if this is true but she is concerned.  She also reports a single episode of rectal bleeding with liquid stool today. She denies recent constipation or exposure to illness. She had rectal bleeding in 2008 and was admitted to [facility] for colon ulcers but has not had problems since then.  She has not tried any treatments for her vaginal symptoms or her rectal pain.  Nothing makes them better or worse. She denies vaginal bleeding, urinary symptoms, h/a, dizziness, n/v, or fever/chills.     HPI  Past Medical History  Diagnosis Date  . Chest pain   . Depression   . Fibromyalgia   . Anemia   . Ulcer   . Rectal bleeding   . Asthma   . Gestational diabetes 2010, 2012  . Anxiety   . Heartburn during pregnancy   . Seizures (HCC) 2015    x 1- unknown reason- passed out   Past Surgical History  Procedure Laterality Date  . Cesarean section      x 2  . Wisdom tooth extraction    . Cesarean section with bilateral tubal ligation Bilateral 07/03/2015    Procedure: REPEAT CESAREAN SECTION ;  Surgeon: Tereso Newcomer, MD;  Location: [facility];  Service: Obstetrics;  Laterality: Bilateral;   Social History   Social History  . Marital Status: Divorced    Spouse Name: N/A  . Number of Children: N/A  . Years of Education: N/A   Occupational History  . Not on file.   Social History Main  Topics  . Smoking status: Current Some Day Smoker -- 0.25 packs/day    Types: Cigarettes  . Smokeless tobacco: Never Used  . Alcohol Use: No  . Drug Use: Yes    Special: Marijuana     Comment: not while pregnant  . Sexual Activity: Yes    Birth Control/ Protection: None   Other Topics Concern  . Not on file   Social History Narrative   No current facility-administered medications on file prior to encounter.   Current Outpatient Prescriptions on File Prior to Encounter  Medication Sig Dispense Refill  . albuterol (PROVENTIL HFA;VENTOLIN HFA) 108 (90 BASE) MCG/ACT inhaler Inhale 2 puffs into the lungs every 6 (six) hours as needed for wheezing or shortness of breath. 18 g 5  . aspirin 81 MG chewable tablet Chew 1 tablet (81 mg total) by mouth daily. 90 tablet 3  . beclomethasone (QVAR) 40 MCG/ACT inhaler Inhale 2 puffs into the lungs 2 (two) times daily. 1 Inhaler 12  . Prenatal Vit-Fe Fumarate-FA (PRENATAL VITAMIN) 27-0.8 MG TABS Take 1 tablet by mouth every morning. 30 tablet 0  . ALPRAZolam (XANAX) 0.5 MG tablet Take once a day by mouth as needed (Patient taking differently: Take 0.5 mg by mouth at bedtime as needed for anxiety. Take once a day by  mouth as needed) 30 tablet 3   No Known Allergies  ROS:  Review of Systems  Constitutional: Negative for fever, chills and fatigue.  Respiratory: Negative for shortness of breath.   Cardiovascular: Negative for chest pain.  Gastrointestinal: Positive for blood in stool.  Genitourinary: Positive for vaginal discharge. Negative for dysuria, flank pain, vaginal bleeding, difficulty urinating, vaginal pain and pelvic pain.  Neurological: Negative for dizziness and headaches.  Psychiatric/Behavioral: Negative.      I have reviewed patient's Past Medical Hx, Surgical Hx, Family Hx, Social Hx, medications and allergies.   Physical Exam   Patient Vitals for the past 24 hrs:  BP Temp Pulse Resp Height Weight  04/04/16 1208 (!) 92/46  mmHg - 73 18 - -  04/04/16 1015 (!) 100/53 mmHg - 63 - - -  04/04/16 1010 (!) 107/53 mmHg 98.5 F (36.9 C) 63 18 5\' 8"  (1.727 m) 105 lb (47.628 kg)   Constitutional: Well-developed, well-nourished female in no acute distress.  Cardiovascular: normal rate Respiratory: normal effort GI: Abd soft, non-tender. Pos BS x 4 MS: Extremities nontender, no edema, normal ROM Neurologic: Alert and oriented x 4.  GU: Neg CVAT.  PELVIC EXAM: Cervix pink, visually closed, without lesion, moderate amount thick curd-like discharge, vaginal walls and external genitalia normal  Rectal exam with no palpable mass/hemorrhoid, no blood on glove only small amount of stool following exam  Cervix closed/thick/high, posterior  FHT 154 by doppler  LAB RESULTS Results for orders placed or performed during the hospital encounter of 04/04/16 (from the past 24 hour(s))  Urinalysis, Routine w reflex microscopic (not [facility])     Status: Abnormal   Collection Time: 04/04/16 10:00 AM  Result Value Ref Range   Color, Urine YELLOW YELLOW   APPearance CLEAR CLEAR   Specific Gravity, Urine <1.005 (L) 1.005 - 1.030   pH 6.0 5.0 - 8.0   Glucose, UA NEGATIVE NEGATIVE mg/dL   Hgb urine dipstick TRACE (A) NEGATIVE   Bilirubin Urine NEGATIVE NEGATIVE   Ketones, ur NEGATIVE NEGATIVE mg/dL   Protein, ur NEGATIVE NEGATIVE mg/dL   Nitrite NEGATIVE NEGATIVE   Leukocytes, UA LARGE (A) NEGATIVE  Urine microscopic-add on     Status: Abnormal   Collection Time: 04/04/16 10:00 AM  Result Value Ref Range   Squamous Epithelial / LPF 0-5 (A) NONE SEEN   WBC, UA 6-30 0 - 5 WBC/hpf   RBC / HPF 0-5 0 - 5 RBC/hpf   Bacteria, UA FEW (A) NONE SEEN  Wet prep, genital     Status: Abnormal   Collection Time: 04/04/16 10:40 AM  Result Value Ref Range   Yeast Wet Prep HPF POC NONE SEEN NONE SEEN   Trich, Wet Prep NONE SEEN NONE SEEN   Clue Cells Wet Prep HPF POC NONE SEEN NONE SEEN   WBC, Wet Prep HPF POC MANY (A) NONE SEEN   Sperm  NONE SEEN   CBC     Status: Abnormal   Collection Time: 04/04/16 10:52 AM  Result Value Ref Range   WBC 7.1 4.0 - 10.5 K/uL   RBC 3.39 (L) 3.87 - 5.11 MIL/uL   Hemoglobin 10.2 (L) 12.0 - 15.0 g/dL   HCT 16.129.2 (L) 09.636.0 - 04.546.0 %   MCV 86.1 78.0 - 100.0 fL   MCH 30.1 26.0 - 34.0 pg   MCHC 34.9 30.0 - 36.0 g/dL   RDW 40.913.7 81.111.5 - 91.415.5 %   Platelets 196 150 - 400 K/uL  O/POS/-- (02/08 1027)  IMAGING No results found.  MAU Management/MDM: Ordered labs and reviewed results.  Pt given treatment for possible exposure to STDs with Rocephin 250 mg IM, azithromycin 1 g PO and Flagyl 2 g PO, also given Zofran 8 mg ODT. STD testing done today and pending.  Message sent to [facility] to set up referral to GI to follow up on rectal bleeding.  Rectal exam in MAU without abnormality.  Rx for Terazol 7 to treat visible yeast on exam. Pt stable at time of discharge.  ASSESSMENT 1. Possible exposure to STD   2. Rectal bleeding   3. Vaginal discharge during pregnancy in second trimester   4. Vaginal candidiasis     PLAN Discharge home   Medication List    TAKE these medications        albuterol 108 (90 Base) MCG/ACT inhaler  Commonly known as:  PROVENTIL HFA;VENTOLIN HFA  Inhale 2 puffs into the lungs every 6 (six) hours as needed for wheezing or shortness of breath.     ALPRAZolam 0.5 MG tablet  Commonly known as:  XANAX  Take once a day by mouth as needed     aspirin 81 MG chewable tablet  Chew 1 tablet (81 mg total) by mouth daily.     beclomethasone 40 MCG/ACT inhaler  Commonly known as:  QVAR  Inhale 2 puffs into the lungs 2 (two) times daily.     Prenatal Vitamin 27-0.8 MG Tabs  Take 1 tablet by mouth every morning.     terconazole 0.4 % vaginal cream  Commonly known as:  TERAZOL 7  Place 1 applicator vaginally at bedtime.       Follow-up Information    Follow up with [facility].   Specialty:  Obstetrics and Gynecology   Why:  As  scheduled, Return to MAU as needed for emergencies   Contact information:   [address] [place] [address] [phone]      Please follow up.   Why:  Your office will call you with referral to GI      Sharen Counter Certified Nurse-Midwife 04/04/2016  2:08 PM

## 2016-04-04 NOTE — MAU Note (Signed)
Pt presents to MAU with complaints of white vaginal discharge with rectal bleeding this morning when she was having a BM. Denies any vaginal bleeding. Reports cramping in her left side

## 2016-04-05 LAB — CULTURE, OB URINE

## 2016-04-05 LAB — GC/CHLAMYDIA PROBE AMP (~~LOC~~) NOT AT ARMC
CHLAMYDIA, DNA PROBE: NEGATIVE
Neisseria Gonorrhea: NEGATIVE

## 2016-04-05 LAB — HCV COMMENT:

## 2016-04-05 LAB — HIV ANTIBODY (ROUTINE TESTING W REFLEX): HIV SCREEN 4TH GENERATION: NONREACTIVE

## 2016-04-05 LAB — HEPATITIS C ANTIBODY (REFLEX)

## 2016-04-05 LAB — RPR: RPR: NONREACTIVE

## 2016-04-06 ENCOUNTER — Encounter: Payer: Self-pay | Admitting: Student

## 2016-04-06 DIAGNOSIS — R8271 Bacteriuria: Secondary | ICD-10-CM | POA: Insufficient documentation

## 2016-04-10 ENCOUNTER — Telehealth: Payer: Self-pay | Admitting: *Deleted

## 2016-04-10 ENCOUNTER — Encounter: Payer: Self-pay | Admitting: *Deleted

## 2016-04-10 NOTE — Telephone Encounter (Signed)
Sent letter to patient to inform removal from [facility] program - Pt never activated [employer] [facility]

## 2016-04-16 ENCOUNTER — Encounter (HOSPITAL_COMMUNITY): Payer: Self-pay | Admitting: Family Medicine

## 2016-04-25 ENCOUNTER — Ambulatory Visit (HOSPITAL_COMMUNITY)
Admission: RE | Admit: 2016-04-25 | Discharge: 2016-04-25 | Disposition: A | Discharge status: Home / Self Care | Payer: Medicaid Other | Source: Ambulatory Visit | Attending: Family Medicine | Admitting: Family Medicine

## 2016-04-25 ENCOUNTER — Other Ambulatory Visit: Payer: Self-pay | Admitting: Family Medicine

## 2016-04-25 DIAGNOSIS — O34219 Maternal care for unspecified type scar from previous cesarean delivery: Secondary | ICD-10-CM

## 2016-04-25 DIAGNOSIS — Z1389 Encounter for screening for other disorder: Secondary | ICD-10-CM

## 2016-04-25 DIAGNOSIS — Z3A20 20 weeks gestation of pregnancy: Secondary | ICD-10-CM

## 2016-04-25 DIAGNOSIS — Z36 Encounter for antenatal screening of mother: Secondary | ICD-10-CM | POA: Insufficient documentation

## 2016-04-25 DIAGNOSIS — Z3491 Encounter for supervision of normal pregnancy, unspecified, first trimester: Secondary | ICD-10-CM

## 2016-05-03 ENCOUNTER — Ambulatory Visit (INDEPENDENT_AMBULATORY_CARE_PROVIDER_SITE_OTHER): Payer: Medicaid Other | Admitting: Obstetrics and Gynecology

## 2016-05-03 ENCOUNTER — Telehealth: Payer: Self-pay | Admitting: *Deleted

## 2016-05-03 ENCOUNTER — Encounter: Payer: Self-pay | Admitting: Obstetrics and Gynecology

## 2016-05-03 VITALS — BP 98/61 | HR 81 | Wt 113.0 lb

## 2016-05-03 DIAGNOSIS — O99332 Smoking (tobacco) complicating pregnancy, second trimester: Secondary | ICD-10-CM

## 2016-05-03 DIAGNOSIS — O09899 Supervision of other high risk pregnancies, unspecified trimester: Secondary | ICD-10-CM | POA: Diagnosis not present

## 2016-05-03 DIAGNOSIS — O09292 Supervision of pregnancy with other poor reproductive or obstetric history, second trimester: Secondary | ICD-10-CM

## 2016-05-03 DIAGNOSIS — J4540 Moderate persistent asthma, uncomplicated: Secondary | ICD-10-CM | POA: Diagnosis not present

## 2016-05-03 DIAGNOSIS — Z3492 Encounter for supervision of normal pregnancy, unspecified, second trimester: Secondary | ICD-10-CM

## 2016-05-03 DIAGNOSIS — O34219 Maternal care for unspecified type scar from previous cesarean delivery: Secondary | ICD-10-CM | POA: Diagnosis not present

## 2016-05-03 DIAGNOSIS — K30 Functional dyspepsia: Secondary | ICD-10-CM

## 2016-05-03 DIAGNOSIS — Z8639 Personal history of other endocrine, nutritional and metabolic disease: Secondary | ICD-10-CM

## 2016-05-03 MED ORDER — FAMOTIDINE 20 MG PO TABS: 20.0000 mg | ORAL_TABLET | Freq: Two times a day (BID) | ORAL | Status: DC

## 2016-05-03 NOTE — Telephone Encounter (Signed)
[facility]

## 2016-05-03 NOTE — Progress Notes (Signed)
Subjective:  Kim [name] is a 26 y.o. [id number] at 7758w3d being seen today for ongoing prenatal care.  She is currently monitored for the following issues for this high-risk pregnancy and has Supervision of normal pregnancy; History of diabetes as a child and gestational diabetes; Underweight; Previous cesarean section complicating pregnancy, antepartum condition or complication; H/O pre-eclampsia in prior pregnancy, currently pregnant; Asthma, moderate persistent, well-controlled; Tobacco smoking affecting pregnancy, antepartum; Bipolar depression (HCC); Asthma affecting pregnancy, antepartum; Short interval between pregnancies affecting pregnancy, antepartum; and GBS bacteriuria on her problem list.  Patient reports heartburn which is not relieved by tums or peptobismal.  Contractions: Not present. Vag. Bleeding: None.  Movement: Present. Denies leaking of fluid.   The following portions of the patient's history were reviewed and updated as appropriate: allergies, current medications, past family history, past medical history, past social history, past surgical history and problem list. Problem list updated.  Objective:   Filed Vitals:   05/03/16 0953  BP: 98/61  Pulse: 81  Weight: 113 lb (51.256 kg)    Fetal Status: Fetal Heart Rate (bpm): 147 Fundal Height: 20 cm Movement: Present     General:  Alert, oriented and cooperative. Patient is in no acute distress.  Skin: Skin is warm and dry. No rash noted.   Cardiovascular: Normal heart rate noted  Respiratory: Normal respiratory effort, no problems with respiration noted  Abdomen: Soft, gravid, appropriate for gestational age. Pain/Pressure: Absent     Pelvic: Vag. Bleeding: None Vag D/C Character: Thin   Cervical exam deferred        Extremities: Normal range of motion.  Edema: None  Mental Status: Normal mood and affect. Normal behavior. Normal judgment and thought content.   Urinalysis:      Assessment and Plan:  Pregnancy: [id number]  at 3858w3d  1. Previous cesarean section complicating pregnancy, antepartum condition or complication Patient will be scheduled for repeat c-section  2. H/O pre-eclampsia in prior pregnancy, currently pregnant, second trimester Continue ASA  3. Asthma, moderate persistent, well-controlled   4. Tobacco smoking affecting pregnancy, antepartum, second trimester Patient is now smoking 1/2 pack per day. Smoking cessation strategies discussed  5. Short interval between pregnancies affecting pregnancy, antepartum   6. History of diabetes as a child and gestational diabetes   7. Supervision of normal pregnancy, second trimester Rx Pepcid provided F/U anatomy ultrasound ordered - US MFM OB FOLLOW UP; Future  General obstetric precautions including but not limited to vaginal bleeding, contractions, leaking of fluid and fetal movement were reviewed in detail with the patient. Please refer to After Visit Summary for other counseling recommendations.  Return in about 4 weeks (around 05/31/2016).   Catalina AntiguaPeggy Miller [place], MD

## 2016-05-03 NOTE — Telephone Encounter (Signed)
-----   Message from Olevia BowensJacinda S Battle sent at 05/03/2016 10:24 AM EDT ----- Regarding: Pharmacy Change [facility] [place] [facility] [place] [facility])

## 2016-05-29 ENCOUNTER — Ambulatory Visit (HOSPITAL_COMMUNITY)
Admission: RE | Admit: 2016-05-29 | Discharge: 2016-05-29 | Disposition: A | Discharge status: Home / Self Care | Payer: Medicaid Other | Source: Ambulatory Visit | Attending: Obstetrics and Gynecology | Admitting: Obstetrics and Gynecology

## 2016-05-29 DIAGNOSIS — Z3492 Encounter for supervision of normal pregnancy, unspecified, second trimester: Secondary | ICD-10-CM | POA: Diagnosis not present

## 2016-05-29 DIAGNOSIS — Z3A25 25 weeks gestation of pregnancy: Secondary | ICD-10-CM | POA: Diagnosis not present

## 2016-05-31 ENCOUNTER — Ambulatory Visit (INDEPENDENT_AMBULATORY_CARE_PROVIDER_SITE_OTHER): Payer: Medicaid Other | Admitting: Obstetrics & Gynecology

## 2016-05-31 ENCOUNTER — Encounter: Payer: Self-pay | Admitting: *Deleted

## 2016-05-31 VITALS — BP 103/66 | HR 105 | Wt 114.0 lb

## 2016-05-31 DIAGNOSIS — O26892 Other specified pregnancy related conditions, second trimester: Secondary | ICD-10-CM

## 2016-05-31 DIAGNOSIS — Z3492 Encounter for supervision of normal pregnancy, unspecified, second trimester: Secondary | ICD-10-CM

## 2016-05-31 DIAGNOSIS — Z8639 Personal history of other endocrine, nutritional and metabolic disease: Secondary | ICD-10-CM

## 2016-05-31 DIAGNOSIS — O09292 Supervision of pregnancy with other poor reproductive or obstetric history, second trimester: Secondary | ICD-10-CM

## 2016-05-31 DIAGNOSIS — O34219 Maternal care for unspecified type scar from previous cesarean delivery: Secondary | ICD-10-CM

## 2016-05-31 DIAGNOSIS — R309 Painful micturition, unspecified: Secondary | ICD-10-CM

## 2016-05-31 DIAGNOSIS — O09899 Supervision of other high risk pregnancies, unspecified trimester: Secondary | ICD-10-CM

## 2016-05-31 DIAGNOSIS — R300 Dysuria: Secondary | ICD-10-CM

## 2016-05-31 LAB — POCT URINALYSIS DIPSTICK
BILIRUBIN UA: NEGATIVE
Glucose, UA: NEGATIVE
KETONES UA: NEGATIVE
Nitrite, UA: NEGATIVE
PH UA: 6.5
Protein, UA: NEGATIVE
RBC UA: NEGATIVE
Spec Grav, UA: 1.01
Urobilinogen, UA: 0.2

## 2016-05-31 MED ORDER — SULFAMETHOXAZOLE-TRIMETHOPRIM 800-160 MG PO TABS: 1.0000 | ORAL_TABLET | Freq: Two times a day (BID) | ORAL | Status: DC

## 2016-05-31 NOTE — Progress Notes (Signed)
Subjective:  Kim [name] is a 26 y.o. [id number] at 4159w3d being seen today for ongoing prenatal care.  She is currently monitored for the following issues for this low-risk pregnancy and has Supervision of normal pregnancy; History of diabetes as a child and gestational diabetes; Underweight; Previous cesarean section complicating pregnancy, antepartum condition or complication; H/O pre-eclampsia in prior pregnancy, currently pregnant; Asthma, moderate persistent, well-controlled; Tobacco smoking affecting pregnancy, antepartum; Bipolar depression (HCC); Asthma affecting pregnancy, antepartum; Short interval between pregnancies affecting pregnancy, antepartum; and GBS bacteriuria on her problem list.  Patient reports UTI symptoms.  Contractions: Not present. Vag. Bleeding: None.  Movement: Present. Denies leaking of fluid.   The following portions of the patient's history were reviewed and updated as appropriate: allergies, current medications, past family history, past medical history, past social history, past surgical history and problem list. Problem list updated.  Objective:   Filed Vitals:   05/31/16 1004  BP: 103/66  Pulse: 105  Weight: 114 lb (51.71 kg)    Fetal Status:     Movement: Present     General:  Alert, oriented and cooperative. Patient is in no acute distress.  Skin: Skin is warm and dry. No rash noted.   Cardiovascular: Normal heart rate noted  Respiratory: Normal respiratory effort, no problems with respiration noted  Abdomen: Soft, gravid, appropriate for gestational age. Pain/Pressure: Absent     Pelvic: Vag. Bleeding: None Vag D/C Character: Thin   Cervical exam deferred        Extremities: Normal range of motion.  Edema: None  Mental Status: Normal mood and affect. Normal behavior. Normal judgment and thought content.   Urinalysis:      Assessment and Plan:  Pregnancy: [id number] at 1559w3d  1. H/O pre-eclampsia in prior pregnancy, currently pregnant, second  trimester - baby ASA  2. Short interval between pregnancies affecting pregnancy, antepartum   3. Supervision of normal pregnancy, second trimester   4. History of diabetes as a child and gestational diabetes - Glucola at next visit  5. Previous cesarean section complicating pregnancy, antepartum condition or complication - she will sign MCD forms today  Preterm labor symptoms and general obstetric precautions including but not limited to vaginal bleeding, contractions, leaking of fluid and fetal movement were reviewed in detail with the patient. Please refer to After Visit Summary for other counseling recommendations.  No Follow-up on file.   Kim BossierMyra C Yolunda Kloos, MD

## 2016-06-02 LAB — CULTURE, OB URINE

## 2016-06-05 ENCOUNTER — Telehealth: Payer: Self-pay | Admitting: *Deleted

## 2016-06-05 DIAGNOSIS — B3731 Acute candidiasis of vulva and vagina: Secondary | ICD-10-CM

## 2016-06-05 DIAGNOSIS — B373 Candidiasis of vulva and vagina: Secondary | ICD-10-CM

## 2016-06-05 MED ORDER — FLUCONAZOLE 150 MG PO TABS: 150.0000 mg | ORAL_TABLET | Freq: Once | ORAL | Status: DC

## 2016-06-05 NOTE — Telephone Encounter (Signed)
Informed pt of yeast on urine cx, sent Diflucan to pharmacy per Dr Marice Potterove order.

## 2016-06-27 ENCOUNTER — Ambulatory Visit (INDEPENDENT_AMBULATORY_CARE_PROVIDER_SITE_OTHER): Payer: Medicaid Other | Admitting: Obstetrics & Gynecology

## 2016-06-27 VITALS — BP 107/63 | HR 82 | Wt 117.0 lb

## 2016-06-27 DIAGNOSIS — R8271 Bacteriuria: Secondary | ICD-10-CM

## 2016-06-27 DIAGNOSIS — Z3493 Encounter for supervision of normal pregnancy, unspecified, third trimester: Secondary | ICD-10-CM

## 2016-06-27 DIAGNOSIS — Z23 Encounter for immunization: Secondary | ICD-10-CM | POA: Diagnosis not present

## 2016-06-27 DIAGNOSIS — F32A Depression, unspecified: Secondary | ICD-10-CM

## 2016-06-27 DIAGNOSIS — J4540 Moderate persistent asthma, uncomplicated: Secondary | ICD-10-CM

## 2016-06-27 DIAGNOSIS — Z36 Encounter for antenatal screening of mother: Secondary | ICD-10-CM

## 2016-06-27 DIAGNOSIS — F3130 Bipolar disorder, current episode depressed, mild or moderate severity, unspecified: Secondary | ICD-10-CM

## 2016-06-27 DIAGNOSIS — O99333 Smoking (tobacco) complicating pregnancy, third trimester: Secondary | ICD-10-CM

## 2016-06-27 DIAGNOSIS — F319 Bipolar disorder, unspecified: Secondary | ICD-10-CM

## 2016-06-27 DIAGNOSIS — Z8639 Personal history of other endocrine, nutritional and metabolic disease: Secondary | ICD-10-CM

## 2016-06-27 DIAGNOSIS — F329 Major depressive disorder, single episode, unspecified: Secondary | ICD-10-CM

## 2016-06-27 DIAGNOSIS — O9989 Other specified diseases and conditions complicating pregnancy, childbirth and the puerperium: Secondary | ICD-10-CM

## 2016-06-27 DIAGNOSIS — O34219 Maternal care for unspecified type scar from previous cesarean delivery: Secondary | ICD-10-CM

## 2016-06-27 DIAGNOSIS — F419 Anxiety disorder, unspecified: Secondary | ICD-10-CM

## 2016-06-27 DIAGNOSIS — Z3483 Encounter for supervision of other normal pregnancy, third trimester: Secondary | ICD-10-CM

## 2016-06-27 DIAGNOSIS — O09293 Supervision of pregnancy with other poor reproductive or obstetric history, third trimester: Secondary | ICD-10-CM

## 2016-06-27 DIAGNOSIS — O99519 Diseases of the respiratory system complicating pregnancy, unspecified trimester: Secondary | ICD-10-CM

## 2016-06-27 DIAGNOSIS — F418 Other specified anxiety disorders: Secondary | ICD-10-CM

## 2016-06-27 DIAGNOSIS — J45909 Unspecified asthma, uncomplicated: Secondary | ICD-10-CM

## 2016-06-27 LAB — CBC
HEMATOCRIT: 28.1 % — AB (ref 35.0–45.0)
HEMOGLOBIN: 9.6 g/dL — AB (ref 11.7–15.5)
MCH: 30.4 pg (ref 27.0–33.0)
MCHC: 34.2 g/dL (ref 32.0–36.0)
MCV: 88.9 fL (ref 80.0–100.0)
MPV: 10 fL (ref 7.5–12.5)
Platelets: 248 10*3/uL (ref 140–400)
RBC: 3.16 MIL/uL — ABNORMAL LOW (ref 3.80–5.10)
RDW: 12.6 % (ref 11.0–15.0)
WBC: 8.1 10*3/uL (ref 3.8–10.8)

## 2016-06-27 MED ORDER — ALPRAZOLAM 0.5 MG PO TABS
0.5000 mg | ORAL_TABLET | Freq: Every evening | ORAL | Status: DC | PRN
Start: 2016-06-27 — End: 2018-07-11

## 2016-06-27 NOTE — Progress Notes (Signed)
Subjective:  Kim [name] is a 26 y.o. [phone] at 722w2d being seen today for ongoing prenatal care.  She is currently monitored for the following issues for this high-risk pregnancy and has Supervision of normal pregnancy; History of diabetes as a child and gestational diabetes; Underweight; Previous cesarean section complicating pregnancy, antepartum condition or complication; H/O pre-eclampsia in prior pregnancy, currently pregnant; Asthma, moderate persistent, well-controlled; Tobacco smoking affecting pregnancy, antepartum; Bipolar depression (HCC); Asthma affecting pregnancy, antepartum; Short interval between pregnancies affecting pregnancy, antepartum; and GBS bacteriuria on her problem list.  Patient reports pt fell down 4 steps on to her back yesterday- denies ctx.  reports back pain today.  No difficulty walking.    .  Contractions: Not present. Vag. Bleeding: None.  Movement: Present. Denies leaking of fluid.   The following portions of the patient's history were reviewed and updated as appropriate: allergies, current medications, past family history, past medical history, past social history, past surgical history and problem list. Problem list updated.  Objective:   Filed Vitals:   06/27/16 1003  BP: 107/63  Pulse: 82  Weight: 117 lb (53.071 kg)    Fetal Status: Fetal Heart Rate (bpm): 142 Fundal Height: 26 cm Movement: Present     General:  Alert, oriented and cooperative. Patient is in no acute distress.  Skin: Skin is warm and dry. No rash noted.   Cardiovascular: Normal heart rate noted  Respiratory: Normal respiratory effort, no problems with respiration noted  Abdomen: Soft, gravid, appropriate for gestational age. Pain/Pressure: Present     Pelvic: Cervical exam deferred        Extremities: Normal range of motion.  Edema: None  Mental Status: Normal mood and affect. Normal behavior. Normal judgment and thought content.   Urinalysis:      Assessment and Plan:   Pregnancy: [id number] at 622w2d  1. Supervision of normal pregnancy in third trimester  - HIV antibody - RPR - Tdap vaccine greater than or equal to 7yo IM - Glucose Tolerance, 1 HR (50g) - CBC  2. Supervision of normal pregnancy, third trimester S/p fall.  No major trauma and no c/o after fall.  Did not got to Ed.  Fall >24 hours   3. Tobacco smoking affecting pregnancy, antepartum, third trimester Still smoking but, decreased amount per pt  4. Previous cesarean section complicating pregnancy, antepartum condition or complication Needs repeat at 39 weeks  Will send note to be scheduled.  5. History of diabetes as a child and gestational diabetes Resolved spontaneously 1 hour GTT obtained today  6. H/O pre-eclampsia in prior pregnancy, currently pregnant, third trimester BP normal today  7. GBS bacteriuria Needs atbx in labor  8. Bipolar depression (HCC)   9. Asthma, moderate persistent, well-controlled   10. Asthma affecting pregnancy, antepartum   11. Anxiety and depression Refilled: - ALPRAZolam (XANAX) 0.5 MG tablet; Take 1 tablet (0.5 mg total) by mouth at bedtime as needed for anxiety. Take once a day by mouth as needed  Dispense: 30 tablet; Refill: 3  Preterm labor symptoms and general obstetric precautions including but not limited to vaginal bleeding, contractions, leaking of fluid and fetal movement were reviewed in detail with the patient. Please refer to After Visit Summary for other counseling recommendations.  Return in about 2 weeks (around 07/11/2016).   Willodean Rosenthalarolyn Harraway-Smith, MD

## 2016-06-27 NOTE — Progress Notes (Signed)
Pt states she fell down the stairs yesterday and landed on her lower back and bottom, c/o lower back pain now. States she is feeling good fetal movement.

## 2016-06-27 NOTE — Patient Instructions (Signed)
What Do I Need to Know About Injuries During Pregnancy? °Trauma is the most common cause of injury and death in pregnant women. This can also result in significant harm or death of the baby. °Your baby is protected in the womb (uterus) by a sac filled with fluid (amniotic sac). Your baby can be harmed if there is direct, high-impact trauma to your abdomen and pelvis. This type of trauma can result in tearing of your uterus, the placenta pulling away from the wall of the uterus (placenta abruption), or the amniotic sac breaking open (rupture of membranes). These injuries can decrease or stop the blood supply to your baby or cause you to go into labor earlier than expected. Minor falls and low-impact automobile accidents do not usually harm your baby, even if they do minimally harm you. °WHAT KIND OF INJURIES CAN AFFECT MY PREGNANCY? °The most common causes of injury or death to a baby include: °· Falls. Falls are more common in the second and third trimester of the pregnancy. Factors that increase your risk of falling include: °¨ Increase in your weight. °¨ The change in your center of gravity. °¨ Tripping over an object that cannot be seen. °¨ Increased looseness (laxity) of your ligaments resulting in less coordinated movements (you may feel clumsy). °¨ Falling during high-risk activities like horseback riding or skiing. °· Automobile accidents. It is important to wear your seat belt properly, with the lap belt below your abdomen, and always practice safe driving. °· Domestic violence or assault. °· Burns (fire or electrical). °The most common causes of injury or death to the pregnant woman include: °· Injuries that cause severe bleeding, shock, and loss of blood flow to major organs. °· Head and neck injuries that result in severe brain or spinal damage. °· Chest trauma that can cause direct injury to the heart and lungs or any injury that affects the area enclosed by the ribs. Trauma to this area can result in  cardiorespiratory arrest. °WHAT CAN I DO TO PROTECT MYSELF AND MY BABY FROM INJURY WHILE I AM PREGNANT? °· Remove slippery rugs and loose objects on the floor that increase your risk of tripping. °· Avoid walking on wet or slippery floors. °· Wear comfortable shoes that have a good grip on the sole. Do not wear high-heeled shoes. °· Always wear your seat belt properly, with the lap belt below your abdomen, and always practice safe driving. Do not ride on a motorcycle while pregnant. °· Do not participate in high-impact activities or sports. °· Avoid fires, starting fires, lifting heavy pots of boiling or hot liquids, and fixing electrical problems. °· Only take over-the-counter or prescription medicines for pain, fever, or discomfort as directed by your health care provider. °· Know your blood type and the father's blood type in case you develop vaginal bleeding or experience an injury for which a blood transfusion may be necessary. °· Call your local emergency services (911 in the [place] if you are a victim of domestic violence or assault. Spousal abuse can be a significant cause of trauma during pregnancy. For help and support, contact the National Domestic Violence Hotline. °WHEN SHOULD I SEEK IMMEDIATE MEDICAL CARE?  °· You fall on your abdomen or experience any high-force accident or injury. °· You have been assaulted (domestic or otherwise). °· You have been in a car accident. °· You develop vaginal bleeding. °· You develop fluid leaking from the vagina. °· You develop uterine contractions (pelvic cramping, pain, or significant low back   pain). °· You become weak or faint, or have uncontrolled vomiting after trauma. °· You had a serious burn. This includes burns to the face, neck, hands, or genitals, or burns greater than the size of your palm anywhere else. °· You develop neck stiffness or pain after a fall or from other trauma. °· You develop a headache or vision problems after a fall or from other  trauma. °· You do not feel the baby moving or the baby is not moving as much as before a fall or other trauma. °  °This information is not intended to replace advice given to you by your health care provider. Make sure you discuss any questions you have with your health care provider. °  °Document Released: 01/24/2005 Document Revised: 01/07/2015 Document Reviewed: 09/23/2013 °Elsevier Interactive Patient Education ©2016 [employer] ° °

## 2016-06-28 ENCOUNTER — Encounter: Payer: Self-pay | Admitting: *Deleted

## 2016-06-28 LAB — RPR

## 2016-06-28 LAB — HIV ANTIBODY (ROUTINE TESTING W REFLEX): HIV: NONREACTIVE

## 2016-06-28 LAB — GLUCOSE TOLERANCE, 1 HOUR (50G) W/O FASTING: GLUCOSE, 1 HR, GESTATIONAL: 98 mg/dL (ref <140)

## 2016-06-29 ENCOUNTER — Encounter: Payer: Self-pay | Admitting: Obstetrics & Gynecology

## 2016-06-29 ENCOUNTER — Telehealth: Payer: Self-pay | Admitting: *Deleted

## 2016-06-29 DIAGNOSIS — O99019 Anemia complicating pregnancy, unspecified trimester: Secondary | ICD-10-CM | POA: Insufficient documentation

## 2016-06-29 NOTE — Telephone Encounter (Signed)
-----   Message from Willodean Rosenthalarolyn Harraway-Smith, MD sent at 06/29/2016 10:35 AM EDT ----- Please call pt. She is anemic.  She should begin FeSO4 bid.  Thx,  clh-S

## 2016-06-29 NOTE — Telephone Encounter (Signed)
Informed pt of result and recommendation.

## 2016-07-05 ENCOUNTER — Encounter (HOSPITAL_COMMUNITY): Payer: Self-pay | Admitting: *Deleted

## 2016-07-13 ENCOUNTER — Encounter: Payer: Medicaid Other | Admitting: Obstetrics and Gynecology

## 2016-07-20 ENCOUNTER — Encounter (HOSPITAL_COMMUNITY): Payer: Self-pay | Admitting: Emergency Medicine

## 2016-07-20 ENCOUNTER — Emergency Department (HOSPITAL_COMMUNITY): Payer: Medicaid Other

## 2016-07-20 ENCOUNTER — Inpatient Hospital Stay (HOSPITAL_COMMUNITY)
Admission: EM | Admit: 2016-07-20 | Discharge: 2016-07-20 | Disposition: A | Discharge status: Home / Self Care | Payer: Medicaid Other | Attending: Obstetrics & Gynecology | Admitting: Obstetrics & Gynecology

## 2016-07-20 ENCOUNTER — Encounter: Payer: Medicaid Other | Admitting: Obstetrics and Gynecology

## 2016-07-20 DIAGNOSIS — R12 Heartburn: Secondary | ICD-10-CM | POA: Diagnosis not present

## 2016-07-20 DIAGNOSIS — F419 Anxiety disorder, unspecified: Secondary | ICD-10-CM | POA: Insufficient documentation

## 2016-07-20 DIAGNOSIS — O99513 Diseases of the respiratory system complicating pregnancy, third trimester: Secondary | ICD-10-CM | POA: Diagnosis not present

## 2016-07-20 DIAGNOSIS — M797 Fibromyalgia: Secondary | ICD-10-CM | POA: Insufficient documentation

## 2016-07-20 DIAGNOSIS — S93401A Sprain of unspecified ligament of right ankle, initial encounter: Secondary | ICD-10-CM | POA: Insufficient documentation

## 2016-07-20 DIAGNOSIS — W19XXXA Unspecified fall, initial encounter: Secondary | ICD-10-CM | POA: Diagnosis not present

## 2016-07-20 DIAGNOSIS — W109XXA Fall (on) (from) unspecified stairs and steps, initial encounter: Secondary | ICD-10-CM | POA: Insufficient documentation

## 2016-07-20 DIAGNOSIS — M25571 Pain in right ankle and joints of right foot: Secondary | ICD-10-CM | POA: Insufficient documentation

## 2016-07-20 DIAGNOSIS — Z3A32 32 weeks gestation of pregnancy: Secondary | ICD-10-CM | POA: Insufficient documentation

## 2016-07-20 DIAGNOSIS — S99911A Unspecified injury of right ankle, initial encounter: Secondary | ICD-10-CM | POA: Insufficient documentation

## 2016-07-20 DIAGNOSIS — O09892 Supervision of other high risk pregnancies, second trimester: Secondary | ICD-10-CM | POA: Insufficient documentation

## 2016-07-20 DIAGNOSIS — Z7982 Long term (current) use of aspirin: Secondary | ICD-10-CM | POA: Diagnosis not present

## 2016-07-20 DIAGNOSIS — O99333 Smoking (tobacco) complicating pregnancy, third trimester: Secondary | ICD-10-CM | POA: Insufficient documentation

## 2016-07-20 DIAGNOSIS — O9A213 Injury, poisoning and certain other consequences of external causes complicating pregnancy, third trimester: Secondary | ICD-10-CM

## 2016-07-20 DIAGNOSIS — O99343 Other mental disorders complicating pregnancy, third trimester: Secondary | ICD-10-CM | POA: Diagnosis not present

## 2016-07-20 MED ORDER — OXYCODONE-ACETAMINOPHEN 5-325 MG PO TABS
2.0000 | ORAL_TABLET | Freq: Once | ORAL | Status: AC
  Administered 2016-07-20: 2 via ORAL
  Filled 2016-07-20: qty 2

## 2016-07-20 MED ORDER — OXYCODONE-ACETAMINOPHEN 5-325 MG PO TABS: 1.0000 | ORAL_TABLET | Freq: Four times a day (QID) | ORAL | Status: DC | PRN

## 2016-07-20 MED ORDER — SODIUM CHLORIDE 0.9 % IV BOLUS (SEPSIS)
1000.0000 mL | Freq: Once | INTRAVENOUS | Status: AC
  Administered 2016-07-20: 1000 mL via INTRAVENOUS

## 2016-07-20 MED ORDER — LACTATED RINGERS IV BOLUS (SEPSIS): 1000.0000 mL | Freq: Once | INTRAVENOUS | Status: DC

## 2016-07-20 NOTE — ED Notes (Signed)
[facility].  [facility]  [facility] and unavailable to come to bedside. Care Coordinator on [facility] remotely monitoring FHR.

## 2016-07-20 NOTE — Discharge Instructions (Signed)
What Do I Need to Know About Injuries During Pregnancy? °Trauma is the most common cause of injury and death in pregnant women. This can also result in significant harm or death of the baby. °Your baby is protected in the womb (uterus) by a sac filled with fluid (amniotic sac). Your baby can be harmed if there is direct, high-impact trauma to your abdomen and pelvis. This type of trauma can result in tearing of your uterus, the placenta pulling away from the wall of the uterus (placenta abruption), or the amniotic sac breaking open (rupture of membranes). These injuries can decrease or stop the blood supply to your baby or cause you to go into labor earlier than expected. Minor falls and low-impact automobile accidents do not usually harm your baby, even if they do minimally harm you. °WHAT KIND OF INJURIES CAN AFFECT MY PREGNANCY? °The most common causes of injury or death to a baby include: °· Falls. Falls are more common in the second and third trimester of the pregnancy. Factors that increase your risk of falling include: °¨ Increase in your weight. °¨ The change in your center of gravity. °¨ Tripping over an object that cannot be seen. °¨ Increased looseness (laxity) of your ligaments resulting in less coordinated movements (you may feel clumsy). °¨ Falling during high-risk activities like horseback riding or skiing. °· Automobile accidents. It is important to wear your seat belt properly, with the lap belt below your abdomen, and always practice safe driving. °· Domestic violence or assault. °· Burns (fire or electrical). °The most common causes of injury or death to the pregnant woman include: °· Injuries that cause severe bleeding, shock, and loss of blood flow to major organs. °· Head and neck injuries that result in severe brain or spinal damage. °· Chest trauma that can cause direct injury to the heart and lungs or any injury that affects the area enclosed by the ribs. Trauma to this area can result in  cardiorespiratory arrest. °WHAT CAN I DO TO PROTECT MYSELF AND MY BABY FROM INJURY WHILE I AM PREGNANT? °· Remove slippery rugs and loose objects on the floor that increase your risk of tripping. °· Avoid walking on wet or slippery floors. °· Wear comfortable shoes that have a good grip on the sole. Do not wear high-heeled shoes. °· Always wear your seat belt properly, with the lap belt below your abdomen, and always practice safe driving. Do not ride on a motorcycle while pregnant. °· Do not participate in high-impact activities or sports. °· Avoid fires, starting fires, lifting heavy pots of boiling or hot liquids, and fixing electrical problems. °· Only take over-the-counter or prescription medicines for pain, fever, or discomfort as directed by your health care provider. °· Know your blood type and the father's blood type in case you develop vaginal bleeding or experience an injury for which a blood transfusion may be necessary. °· Call your local emergency services (911 in the [place] if you are a victim of domestic violence or assault. Spousal abuse can be a significant cause of trauma during pregnancy. For help and support, contact the National Domestic Violence Hotline. °WHEN SHOULD I SEEK IMMEDIATE MEDICAL CARE?  °· You fall on your abdomen or experience any high-force accident or injury. °· You have been assaulted (domestic or otherwise). °· You have been in a car accident. °· You develop vaginal bleeding. °· You develop fluid leaking from the vagina. °· You develop uterine contractions (pelvic cramping, pain, or significant low back   pain). °· You become weak or faint, or have uncontrolled vomiting after trauma. °· You had a serious burn. This includes burns to the face, neck, hands, or genitals, or burns greater than the size of your palm anywhere else. °· You develop neck stiffness or pain after a fall or from other trauma. °· You develop a headache or vision problems after a fall or from other  trauma. °· You do not feel the baby moving or the baby is not moving as much as before a fall or other trauma. °  °This information is not intended to replace advice given to you by your health care provider. Make sure you discuss any questions you have with your health care provider. °  °Document Released: 01/24/2005 Document Revised: 01/07/2015 Document Reviewed: 09/23/2013 °Elsevier Interactive Patient Education ©2016 Elsevier Inc. ° °

## 2016-07-20 NOTE — ED Notes (Signed)
Patient coming from home with c/o of falling down 4 steps.  Patient shows signs of injury to the right ankle (bruised and swollen), left knee and right elbow abrasions.  Patient is alert and oriented.

## 2016-07-20 NOTE — MAU Provider Note (Signed)
Chief Complaint:  Fall   First Provider Initiated Contact with Patient 07/20/16 1423      HPI: Kim [name] is a 26 y.o. [id number] at [redacted]w[redacted]d pt of [facility] who presents to maternity admissions sent via [facility] from [facility] for a fall at home this morning at 9 am. She reports she was on the stairs and holding her 28 year old when one of her animals got under her feet and caused her to fall forward onto the stairs.  She landed on her knees and twisted her ankle but does not believe she hit her abdomen.  The child in her arms was also unharmed.  She denies any abdominal pain or cramping/contractions.  She does report pain in her right ankle that is constant but throbbing and described as sharp pain that does not radiate.  She is wearing the ankle wrap placed in the ED and has a small bandage on her left knee also placed in the ED before transfer to MAU.  She has not received any medication for pain.  The pain is not relieved by rest or elevation. She has not tried ice at this time.   She reports good fetal movement, denies LOF, vaginal bleeding, vaginal itching/burning, urinary symptoms, h/a, dizziness, n/v, or fever/chills.    HPI  Past Medical History: Past Medical History  Diagnosis Date  . Chest pain   . Depression   . Fibromyalgia   . Anemia   . Ulcer   . Rectal bleeding   . Asthma   . Gestational diabetes 2010, 2012  . Anxiety   . Heartburn during pregnancy   . Seizures (HCC) 2015    x 1- unknown reason- passed out    Past obstetric history: OB History  Gravida Para Term Preterm AB SAB TAB Ectopic Multiple Living  0 3    # Outcome Date GA Lbr Len/2nd Weight Sex Delivery Anes PTL Lv  5 Current           4 Term 07/03/15 [redacted]w[redacted]d  6 lb 14 oz (3.118 kg) M CS-LTranv Spinal  Y  3 Term 02/04/11    F CS-LTranv   Y     Complications: Pre-eclampsia  2 Term 01/12/09    Judie Petit CS-LTranv EPI N Y     Complications: Fetal Intolerance  1 SAB               Past Surgical History: Past  Surgical History  Procedure Laterality Date  . Cesarean section      x 2  . Wisdom tooth extraction    . Cesarean section with bilateral tubal ligation Bilateral 07/03/2015    Procedure: REPEAT CESAREAN SECTION ;  Surgeon: Tereso Newcomer, MD;  Location: [facility];  Service: Obstetrics;  Laterality: Bilateral;    Family History: Family History  Problem Relation Age of Onset  . Depression Mother   . Fibromyalgia Mother   . Diabetes Maternal Grandmother   . Hypertension Maternal Grandmother   . Arthritis Maternal Grandmother   . Thyroid disease Maternal Grandmother     Social History: Social History  Substance Use Topics  . Smoking status: Current Some Day Smoker -- 0.25 packs/day    Types: Cigarettes  . Smokeless tobacco: Never Used  . Alcohol Use: No    Allergies: No Known Allergies  Meds:  Prescriptions prior to admission  Medication Sig Dispense Refill Last Dose  . acetaminophen (TYLENOL) 500 MG tablet Take 1,000 mg  by mouth every 6 (six) hours as needed for headache.   07/19/2016 at Unknown time  . ALPRAZolam (XANAX) 0.5 MG tablet Take 1 tablet (0.5 mg total) by mouth at bedtime as needed for anxiety. Take once a day by mouth as needed 30 tablet 3 Past Week at Unknown time  . aspirin 81 MG chewable tablet Chew 1 tablet (81 mg total) by mouth daily. 90 tablet 3 07/19/2016 at Unknown time  . beclomethasone (QVAR) 40 MCG/ACT inhaler Inhale 2 puffs into the lungs 2 (two) times daily. 1 Inhaler 12 Past Week at Unknown time  . Prenatal Vit-Fe Fumarate-FA (PRENATAL VITAMIN) 27-0.8 MG TABS Take 1 tablet by mouth every morning. (Patient taking differently: Take 2 tablets by mouth every morning. ) 30 tablet 0 07/20/2016 at Unknown time  . albuterol (PROVENTIL HFA;VENTOLIN HFA) 108 (90 BASE) MCG/ACT inhaler Inhale 2 puffs into the lungs every 6 (six) hours as needed for wheezing or shortness of breath. 18 g 5 rescue  . famotidine (PEPCID) 20 MG tablet Take 1 tablet (20 mg total) by mouth 2  (two) times daily. (Patient not taking: Reported on 06/27/2016) 60 tablet 3 Not Taking    ROS:  Review of Systems  Constitutional: Negative for fever, chills and fatigue.  Eyes: Negative for visual disturbance.  Respiratory: Negative for shortness of breath.   Cardiovascular: Negative for chest pain.  Gastrointestinal: Negative for nausea, vomiting and abdominal pain.  Genitourinary: Negative for dysuria, flank pain, vaginal bleeding, vaginal discharge, difficulty urinating, vaginal pain and pelvic pain.  Musculoskeletal: Positive for myalgias.  Neurological: Negative for dizziness and headaches.  Psychiatric/Behavioral: Negative.      I have reviewed patient's Past Medical Hx, Surgical Hx, Family Hx, Social Hx, medications and allergies.   Physical Exam   Patient Vitals for the past 24 hrs:  BP Temp Temp src Pulse Resp SpO2 Height Weight  07/20/16 1350 (!) 101/41 mmHg 98.1 F (36.7 C) Oral 65 16 - - -  07/20/16 1317 108/63 mmHg 98.9 F (37.2 C) - 62 - 99 % - -  07/20/16 1304 - 98.9 F (37.2 C) Oral - - 99 % - -  07/20/16 1300 108/63 mmHg - - 62 - 99 % - -  07/20/16 1245 113/74 mmHg - - 76 - 100 % - -  07/20/16 1230 107/73 mmHg - - 77 - 99 % - -  07/20/16 1215 110/64 mmHg - - 76 - 99 % - -  07/20/16 1200 108/68 mmHg - - 70 - 100 % - -  07/20/16 1145 111/67 mmHg - - 71 - 100 % - -  07/20/16 1130 113/68 mmHg - - 80 - 100 % - -  07/20/16 1115 113/73 mmHg - - 73 - 100 % - -  07/20/16 1100 113/72 mmHg - - 84 - 100 % - -  07/20/16 1045 119/97 mmHg - - 95 - 100 % - -  07/20/16 1030 106/72 mmHg - - 100 - 99 % - -  07/20/16 1019 109/66 mmHg 98.4 F (36.9 C) Oral 90 - 99 % 5\' 8"  (1.727 m) 119 lb (53.978 kg)  07/20/16 1015 109/66 mmHg - - - - - - -   Constitutional: Well-developed, well-nourished female in no acute distress.  Cardiovascular: normal rate Respiratory: normal effort GI: Abd soft, non-tender, gravid appropriate for gestational age.  MS: Extremities nontender, no  edema, normal ROM Neurologic: Alert and oriented x 4.  GU: Neg CVAT.  PELVIC EXAM: Deferred    FHT:  Baseline 135 , moderate variability, accelerations present, no decelerations Contractions: None on toco or to palpation   Labs: No results found for this or any previous visit (from the past 24 hour(s)). O/POS/-- (02/08 1027)  Imaging:  Dg Ankle Right Port  07/20/2016  CLINICAL DATA:  Fall. EXAM: PORTABLE RIGHT ANKLE - 2 VIEW COMPARISON:  No recent prior. FINDINGS: Mild soft tissue swelling. No acute bony or joint abnormality. No evidence of fracture dislocation. IMPRESSION: Mild soft tissue swelling.  No acute bony abnormality. Electronically Signed   By: Maisie Fus  Register   On: 07/20/2016 11:37    MAU Course/MDM: I have ordered labs and reviewed results. Reviewed FHR tracing and toco.  Fall occurred 7 + hours ago.  With pt report of no cramping/contractions and no contractions on toco, unlikely preterm labor.  No trauma occurred to abdomen per pt.  Percocet 5/325 x 2 tabs given in MAU for ankle pain and pain significantly reduced per pt.  Consult Dr Macon Large.  D/C home with precautions/reasons to return to MAU reviewed. Rx for Percocet 5/325, take 1-2 tabs Q 6 hours PRN x 10 tabs. Pt missed prenatal appt at [facility] today, to call and schedule next appt in 2 weeks.  Pt stable at time of discharge.  Assessment: 1. Traumatic injury during pregnancy in third trimester   2. Fall   3. Right ankle sprain, initial encounter     Plan: Discharge home Preterm labor precautions and fetal kick counts      Follow-up Information    Follow up with [facility].   Specialty:  Obstetrics and Gynecology   Why:  In 2 weeks for routine visit, Return to MAU as needed for emergencies   Contact information:   [address] [place] [address] [phone]       Medication List    STOP taking these medications        acetaminophen 500 MG tablet   Commonly known as:  TYLENOL     famotidine 20 MG tablet  Commonly known as:  PEPCID      TAKE these medications        albuterol 108 (90 Base) MCG/ACT inhaler  Commonly known as:  PROVENTIL HFA;VENTOLIN HFA  Inhale 2 puffs into the lungs every 6 (six) hours as needed for wheezing or shortness of breath.     ALPRAZolam 0.5 MG tablet  Commonly known as:  XANAX  Take 1 tablet (0.5 mg total) by mouth at bedtime as needed for anxiety. Take once a day by mouth as needed     aspirin 81 MG chewable tablet  Chew 1 tablet (81 mg total) by mouth daily.     beclomethasone 40 MCG/ACT inhaler  Commonly known as:  QVAR  Inhale 2 puffs into the lungs 2 (two) times daily.     oxyCODONE-acetaminophen 5-325 MG tablet  Commonly known as:  PERCOCET/ROXICET  Take 1-2 tablets by mouth every 6 (six) hours as needed.     Prenatal Vitamin 27-0.8 MG Tabs  Take 1 tablet by mouth every morning.        Sharen Counter Certified Nurse-Midwife 07/20/2016 4:01 PM

## 2016-07-20 NOTE — ED Provider Notes (Signed)
CSN: [id number]     Arrival date & time 07/20/16  1341 History   First MD Initiated Contact with Patient 07/20/16 1013     Chief Complaint  Patient presents with  . Fall     (Consider location/radiation/quality/duration/timing/severity/associated sxs/prior Treatment) HPI 26 year old female who is G5 P3 at roughly [redacted] weeks pregnant states that she fell down 3-4 stairs after being tripped by one of her dogs. She denies hitting her head or abdomen. No loss of consciousness. She's had no abdominal pain, vaginal bleeding or contractions. She complains of right ankle pain. No focal weakness or numbness. Past Medical History  Diagnosis Date  . Chest pain   . Depression   . Fibromyalgia   . Anemia   . Ulcer   . Rectal bleeding   . Asthma   . Gestational diabetes 2010, 2012  . Anxiety   . Heartburn during pregnancy   . Seizures (HCC) 2015    x 1- unknown reason- passed out   Past Surgical History  Procedure Laterality Date  . Cesarean section      x 2  . Wisdom tooth extraction    . Cesarean section with bilateral tubal ligation Bilateral 07/03/2015    Procedure: REPEAT CESAREAN SECTION ;  Surgeon: Tereso Newcomer, MD;  Location: [facility];  Service: Obstetrics;  Laterality: Bilateral;   Family History  Problem Relation Age of Onset  . Depression Mother   . Fibromyalgia Mother   . Diabetes Maternal Grandmother   . Hypertension Maternal Grandmother   . Arthritis Maternal Grandmother   . Thyroid disease Maternal Grandmother    Social History  Substance Use Topics  . Smoking status: Current Some Day Smoker -- 0.25 packs/day    Types: Cigarettes  . Smokeless tobacco: Never Used  . Alcohol Use: No   OB History    Gravida Para Term Preterm AB TAB SAB Ectopic Multiple Living   0 3     Review of Systems  Constitutional: Negative for fever and chills.  Respiratory: Negative for shortness of breath.   Cardiovascular: Negative for chest pain.  Gastrointestinal:  Negative for nausea, vomiting and abdominal pain.  Musculoskeletal: Positive for joint swelling and arthralgias. Negative for back pain.  Skin: Positive for wound.  Neurological: Negative for dizziness, syncope, weakness, light-headedness, numbness and headaches.  All other systems reviewed and are negative.     Allergies  Review of patient's allergies indicates no known allergies.  Home Medications   Prior to Admission medications   Medication Sig Start Date End Date Taking? Authorizing Provider  ALPRAZolam Prudy Feeler) 0.5 MG tablet Take 1 tablet (0.5 mg total) by mouth at bedtime as needed for anxiety. Take once a day by mouth as needed 06/27/16  Yes Willodean Rosenthal, MD  aspirin 81 MG chewable tablet Chew 1 tablet (81 mg total) by mouth daily. 03/07/16  Yes Reva Bores, MD  beclomethasone (QVAR) 40 MCG/ACT inhaler Inhale 2 puffs into the lungs 2 (two) times daily. 03/07/16  Yes Reva Bores, MD  Prenatal Vit-Fe Fumarate-FA (PRENATAL VITAMIN) 27-0.8 MG TABS Take 1 tablet by mouth every morning. Patient taking differently: Take 2 tablets by mouth every morning.  11/07/14  Yes Fayrene Helper, PA-C  albuterol (PROVENTIL HFA;VENTOLIN HFA) 108 (90 BASE) MCG/ACT inhaler Inhale 2 puffs into the lungs every 6 (six) hours as needed for wheezing or shortness of breath. 12/14/14   Reva Bores, MD  oxyCODONE-acetaminophen (PERCOCET/ROXICET) 5-325 MG tablet  Take 1-2 tablets by mouth every 6 (six) hours as needed. 07/20/16   Misty StanleyLisa A Leftwich-Kirby, CNM   BP 97/65 mmHg  Pulse 74  Temp(Src) 98.1 F (36.7 C) (Oral)  Resp 16  Ht 5\' 8"  (1.727 m)  Wt 119 lb (53.978 kg)  BMI 18.10 kg/m2  SpO2 99%  LMP 12/04/2015 (LMP Unknown) Physical Exam  Constitutional: She is oriented to person, place, and time. She appears well-developed and well-nourished. No distress.  HENT:  Head: Normocephalic and atraumatic.  Mouth/Throat: Oropharynx is clear and moist. No oropharyngeal exudate.  Eyes: EOM are normal. Pupils  are equal, round, and reactive to light.  Neck: Normal range of motion. Neck supple.  No posterior midline cervical tenderness to palpation.  Cardiovascular: Normal rate and regular rhythm.  Exam reveals no gallop and no friction rub.   No murmur heard. Pulmonary/Chest: Effort normal and breath sounds normal. No respiratory distress. She has no wheezes. She has no rales. She exhibits no tenderness.  Abdominal: Soft. Bowel sounds are normal. She exhibits no distension and no mass. There is no tenderness. There is no rebound and no guarding.  Gravid abdomen with the uterine fundus above the umbilicus. There is no tenderness to palpation.  Musculoskeletal: She exhibits edema and tenderness.  Patient with swelling and tenderness to palpation over the lateral malleolus of the right ankle. There is decreased range of motion. Distal pulses are equal and intact.  Neurological: She is alert and oriented to person, place, and time.  5/5 motor in all extremities. Sensation is fully intact.  Skin: Skin is warm and dry. No rash noted. No erythema.  Patient has several abrasions to the left knee and lower leg  Psychiatric:  Mildly anxious  Nursing note and vitals reviewed.   ED Course  Procedures (including critical care time) Labs Review Labs Reviewed - No data to display  Imaging Review Dg Ankle Right Port  07/20/2016  CLINICAL DATA:  Fall. EXAM: PORTABLE RIGHT ANKLE - 2 VIEW COMPARISON:  No recent prior. FINDINGS: Mild soft tissue swelling. No acute bony or joint abnormality. No evidence of fracture dislocation. IMPRESSION: Mild soft tissue swelling.  No acute bony abnormality. Electronically Signed   By: Maisie Fushomas  Register   On: 07/20/2016 11:37   I have personally reviewed and evaluated these images and lab results as part of my medical decision-making.   EKG Interpretation None      MDM   Final diagnoses:  Fall  Right ankle sprain, initial encounter  Traumatic injury during pregnancy in  third trimester   Fetal heart tones within normal limits. Questionable uterine irritability per every response nurse. Discuss with OB/GYN on call. Agreed to transfer to the MAU after x-ray completed.  X-ray without any obvious injury. Ace bandage placed. Transferred to MAU.   Loren Raceravid Maurion Walkowiak, MD 07/21/16 [phone]

## 2016-07-20 NOTE — MAU Note (Signed)
Transferred form [facility] following a fall down steps. States she was trying to catch her dog and had toddler in her arms. Did not land on abdomen. Hurt R ankle. Fx ruled out. Xray showed soft tissue swelling.

## 2016-07-20 NOTE — Progress Notes (Signed)
1050 Call placed to Dr. Ranae PalmsYelverton.  Confirmed patient will be transferred via [facility] once ankle x-ray is done.  Informed of Category I FHR tracing and of frequent UI.  Suggested IVF bolus.  Dr. Michelle NasutiYelveton voices agreement with plan for [facility] RN to continue remote monitoring until patient's ankle is cleared and she is transferred. 1055 Dr. Macon LargeAnyanwu informed of above and agrees with plan for IVF bolus and with plan for [facility] RN to continue remote monitoring until patient is transferred.

## 2016-07-23 ENCOUNTER — Ambulatory Visit (INDEPENDENT_AMBULATORY_CARE_PROVIDER_SITE_OTHER): Payer: Medicaid Other | Admitting: Obstetrics & Gynecology

## 2016-07-23 VITALS — BP 112/68 | HR 88 | Wt 119.0 lb

## 2016-07-23 DIAGNOSIS — Z3483 Encounter for supervision of other normal pregnancy, third trimester: Secondary | ICD-10-CM

## 2016-07-23 DIAGNOSIS — Z3493 Encounter for supervision of normal pregnancy, unspecified, third trimester: Secondary | ICD-10-CM

## 2016-07-23 DIAGNOSIS — Z8639 Personal history of other endocrine, nutritional and metabolic disease: Secondary | ICD-10-CM

## 2016-07-23 DIAGNOSIS — O09293 Supervision of pregnancy with other poor reproductive or obstetric history, third trimester: Secondary | ICD-10-CM

## 2016-07-23 DIAGNOSIS — O34219 Maternal care for unspecified type scar from previous cesarean delivery: Secondary | ICD-10-CM

## 2016-07-23 NOTE — Progress Notes (Signed)
Pt left office w/o collecting urine specimen.

## 2016-07-23 NOTE — Progress Notes (Signed)
Subjective:  Kim [name] is a 26 y.o. [phone] at [redacted]w[redacted]d being seen today for ongoing prenatal care.  She is currently monitored for the following issues for this low-risk pregnancy and has Supervision of normal pregnancy; History of diabetes as a child and gestational diabetes; Underweight; Previous cesarean section complicating pregnancy, antepartum condition or complication; H/O pre-eclampsia in prior pregnancy, currently pregnant; Asthma, moderate persistent, well-controlled; Tobacco smoking affecting pregnancy, antepartum; Bipolar depression (HCC); Asthma affecting pregnancy, antepartum; Short interval between pregnancies affecting pregnancy, antepartum; GBS bacteriuria; and Anemia of mother in pregnancy, antepartum on her problem list.  Patient reports no complaints.  Contractions: Not present. Vag. Bleeding: None.  Movement: Present. Denies leaking of fluid.   The following portions of the patient's history were reviewed and updated as appropriate: allergies, current medications, past family history, past medical history, past social history, past surgical history and problem list. Problem list updated.  Objective:   Vitals:   07/23/16 1414  BP: 112/68  Pulse: 88  Weight: 119 lb (54 kg)    Fetal Status: Fetal Heart Rate (bpm): 141   Movement: Present     General:  Alert, oriented and cooperative. Patient is in no acute distress.  Skin: Skin is warm and dry. No rash noted.   Cardiovascular: Normal heart rate noted  Respiratory: Normal respiratory effort, no problems with respiration noted  Abdomen: Soft, gravid, appropriate for gestational age. Pain/Pressure: Absent     Pelvic:  Cervical exam deferred        Extremities: Normal range of motion.  Edema: None  Mental Status: Normal mood and affect. Normal behavior. Normal judgment and thought content.   Urinalysis:      Assessment and Plan:  Pregnancy: [id number] at [redacted]w[redacted]d  1. H/O pre-eclampsia in prior pregnancy, currently  pregnant, third trimester   2. History of diabetes as a child and gestational diabetes   3. Previous cesarean section complicating pregnancy, antepartum condition or complication - RLTCS and BTL scheduled for 09-05-16  4. Supervision of normal pregnancy, third trimester   Preterm labor symptoms and general obstetric precautions including but not limited to vaginal bleeding, contractions, leaking of fluid and fetal movement were reviewed in detail with the patient. Please refer to After Visit Summary for other counseling recommendations.  Return in about 2 weeks (around 08/06/2016).   Allie Bossier, MD

## 2016-08-09 ENCOUNTER — Encounter: Payer: Medicaid Other | Admitting: Obstetrics & Gynecology

## 2016-08-13 ENCOUNTER — Inpatient Hospital Stay (HOSPITAL_COMMUNITY)
Admission: AD | Admit: 2016-08-13 | Discharge: 2016-08-13 | Disposition: A | Discharge status: Home / Self Care | Payer: Medicaid Other | Source: Ambulatory Visit | Attending: Obstetrics and Gynecology | Admitting: Obstetrics and Gynecology

## 2016-08-13 ENCOUNTER — Encounter (HOSPITAL_COMMUNITY): Payer: Self-pay | Admitting: *Deleted

## 2016-08-13 DIAGNOSIS — N898 Other specified noninflammatory disorders of vagina: Secondary | ICD-10-CM | POA: Diagnosis present

## 2016-08-13 DIAGNOSIS — O26893 Other specified pregnancy related conditions, third trimester: Secondary | ICD-10-CM | POA: Diagnosis not present

## 2016-08-13 DIAGNOSIS — Z8632 Personal history of gestational diabetes: Secondary | ICD-10-CM | POA: Insufficient documentation

## 2016-08-13 DIAGNOSIS — F17210 Nicotine dependence, cigarettes, uncomplicated: Secondary | ICD-10-CM | POA: Insufficient documentation

## 2016-08-13 DIAGNOSIS — J45909 Unspecified asthma, uncomplicated: Secondary | ICD-10-CM | POA: Insufficient documentation

## 2016-08-13 DIAGNOSIS — O99519 Diseases of the respiratory system complicating pregnancy, unspecified trimester: Secondary | ICD-10-CM

## 2016-08-13 DIAGNOSIS — O99513 Diseases of the respiratory system complicating pregnancy, third trimester: Secondary | ICD-10-CM | POA: Diagnosis not present

## 2016-08-13 DIAGNOSIS — O09899 Supervision of other high risk pregnancies, unspecified trimester: Secondary | ICD-10-CM

## 2016-08-13 DIAGNOSIS — R8271 Bacteriuria: Secondary | ICD-10-CM | POA: Diagnosis not present

## 2016-08-13 DIAGNOSIS — O99333 Smoking (tobacco) complicating pregnancy, third trimester: Secondary | ICD-10-CM | POA: Diagnosis not present

## 2016-08-13 DIAGNOSIS — O4703 False labor before 37 completed weeks of gestation, third trimester: Secondary | ICD-10-CM | POA: Diagnosis not present

## 2016-08-13 DIAGNOSIS — D649 Anemia, unspecified: Secondary | ICD-10-CM | POA: Insufficient documentation

## 2016-08-13 DIAGNOSIS — O99013 Anemia complicating pregnancy, third trimester: Secondary | ICD-10-CM | POA: Diagnosis not present

## 2016-08-13 DIAGNOSIS — Z3A36 36 weeks gestation of pregnancy: Secondary | ICD-10-CM | POA: Diagnosis not present

## 2016-08-13 DIAGNOSIS — O99012 Anemia complicating pregnancy, second trimester: Secondary | ICD-10-CM

## 2016-08-13 LAB — URINE MICROSCOPIC-ADD ON

## 2016-08-13 LAB — URINALYSIS, ROUTINE W REFLEX MICROSCOPIC
Bilirubin Urine: NEGATIVE
GLUCOSE, UA: NEGATIVE mg/dL
KETONES UR: NEGATIVE mg/dL
LEUKOCYTES UA: NEGATIVE
NITRITE: NEGATIVE
PH: 6.5 (ref 5.0–8.0)
Protein, ur: NEGATIVE mg/dL
Specific Gravity, Urine: <1.005 — ABNORMAL LOW (ref 1.005–1.030)

## 2016-08-13 LAB — POCT FERN TEST

## 2016-08-13 NOTE — Discharge Instructions (Signed)

## 2016-08-13 NOTE — MAU Provider Note (Signed)
Chief Complaint:  Rupture of Membranes   None     HPI: Kim [name] is a 26 y.o. [id number] at 4310w0d [facility] who [facility] reporting leakage of white thin fluid from the vagina while washing dishes tonight. She also reports irregular cramping, worse this morning and improving without treatment.  She has not tried any treatments and her leakage and cramping are improving.  She has hx of C/s x 2 with planned repeat C/S with this pregnancy. She reports good fetal movement, denies vaginal bleeding, vaginal itching/burning, urinary symptoms, h/a, dizziness, n/v, or fever/chills.    HPI  Past Medical History: Past Medical History:  Diagnosis Date  . Anemia   . Anxiety   . Asthma   . Chest pain   . Depression   . Fibromyalgia   . Gestational diabetes 2010, 2012  . Heartburn during pregnancy   . Rectal bleeding   . Seizures (HCC) 2015   x 1- unknown reason- passed out  . Ulcer     Past obstetric history: OB History  Gravida Para Term Preterm AB Living  5 3 3   1 3   SAB TAB Ectopic Multiple Live Births  1     0 3    # Outcome Date GA Lbr Len/2nd Weight Sex Delivery Anes PTL Lv  5 Current           4 Term 07/03/15 3342w0d  6 lb 14 oz (3.118 kg) M CS-LTranv Spinal  LIV  3 Term 02/04/11    F CS-LTranv   LIV     Complications: Pre-eclampsia  2 Term 01/12/09    M CS-LTranv EPI N LIV     Complications: Fetal Intolerance  1 SAB               Past Surgical History: Past Surgical History:  Procedure Laterality Date  . CESAREAN SECTION     x 2  . CESAREAN SECTION WITH BILATERAL TUBAL LIGATION Bilateral 07/03/2015   Procedure: REPEAT CESAREAN SECTION ;  Surgeon: Tereso NewcomerUgonna A Anyanwu, MD;  Location: [facility];  Service: Obstetrics;  Laterality: Bilateral;  . WISDOM TOOTH EXTRACTION      Family History: Family History  Problem Relation Age of Onset  . Depression Mother   . Fibromyalgia Mother   . Diabetes Maternal Grandmother   . Hypertension Maternal  Grandmother   . Arthritis Maternal Grandmother   . Thyroid disease Maternal Grandmother     Social History: Social History  Substance Use Topics  . Smoking status: Current Some Day Smoker    Packs/day: 0.50    Types: Cigarettes  . Smokeless tobacco: Current User  . Alcohol use No    Allergies: No Known Allergies  Meds:  No prescriptions prior to admission.    ROS:  Review of Systems  Constitutional: Negative for chills, fatigue and fever.  Eyes: Negative for visual disturbance.  Respiratory: Negative for shortness of breath.   Cardiovascular: Negative for chest pain.  Gastrointestinal: Positive for abdominal pain. Negative for nausea and vomiting.  Genitourinary: Positive for pelvic pain and vaginal discharge. Negative for difficulty urinating, dysuria, flank pain, vaginal bleeding and vaginal pain.  Neurological: Negative for dizziness and headaches.  Psychiatric/Behavioral: Negative.      I have reviewed patient's Past Medical Hx, Surgical Hx, Family Hx, Social Hx, medications and allergies.   Physical Exam   Patient Vitals for the past 24 hrs:  BP Pulse Resp  08/13/16 1849 119/68 70 16  08/13/16 1626 128/67 86 -   Constitutional: Well-developed, well-nourished female in no acute distress.  Cardiovascular: normal rate Respiratory: normal effort GI: Abd soft, non-tender, gravid appropriate for gestational age.  MS: Extremities nontender, no edema, normal ROM Neurologic: Alert and oriented x 4.  GU: Neg CVAT.  PELVIC EXAM: Cervix pink, visually closed, without lesion, moderate creamy white discharge, negative pooling with valsalva, vaginal walls and external genitalia normal  Negative ferning on slide  Dilation: Closed Effacement (%): Thick Cervical Position: Posterior Station:  (high) Exam by:: L Leftwich-Kirby CNM  FHT:  Baseline 135 , moderate variability, accelerations present, no decelerations Contractions: some irritability, mild to palpation    Labs: Results for orders placed or performed during the hospital encounter of 08/13/16 (from the past 24 hour(s))  Urinalysis, Routine w reflex microscopic ([facility])     Status: Abnormal   Collection Time: 08/13/16  4:50 PM  Result Value Ref Range   Color, Urine YELLOW YELLOW   APPearance CLEAR CLEAR   Specific Gravity, Urine <1.005 (L) 1.005 - 1.030   pH 6.5 5.0 - 8.0   Glucose, UA NEGATIVE NEGATIVE mg/dL   Hgb urine dipstick TRACE (A) NEGATIVE   Bilirubin Urine NEGATIVE NEGATIVE   Ketones, ur NEGATIVE NEGATIVE mg/dL   Protein, ur NEGATIVE NEGATIVE mg/dL   Nitrite NEGATIVE NEGATIVE   Leukocytes, UA NEGATIVE NEGATIVE  Urine microscopic-add on     Status: Abnormal   Collection Time: 08/13/16  4:50 PM  Result Value Ref Range   Squamous Epithelial / LPF 0-5 (A) NONE SEEN   WBC, UA 0-5 0 - 5 WBC/hpf   RBC / HPF 0-5 0 - 5 RBC/hpf   Bacteria, UA RARE (A) NONE SEEN  Fern Test     Status: Normal   Collection Time: 08/13/16  6:59 PM  Result Value Ref Range   POCT Fern Test     O/POS/-- (02/08 1027)   MAU Course/MDM: I have ordered labs and reviewed results.  No evidence of preterm labor or PPROM.  Preterm labor precautions reviewed. Pt stable at time of discharge.  Assessment: 1. Anemia of mother in pregnancy, antepartum, second trimester   2. GBS bacteriuria   3. Asthma affecting pregnancy, antepartum   4. Short interval between pregnancies affecting pregnancy, antepartum   5. Threatened preterm labor, third trimester   6. Vaginal discharge during pregnancy in third trimester     Plan: Discharge home Labor precautions and fetal kick counts  Follow-up Information    [facility] .   Specialty:  Oncology Why:  Tomorrow as scheduled, return to MAU as needed for signs of labor or emergencies Contact information: [address] [place] [id number] [phone]           Medication List    TAKE these  medications   acetaminophen 500 MG tablet Commonly known as:  TYLENOL Take 1,000 mg by mouth every 6 (six) hours as needed for mild pain or headache.   albuterol 108 (90 Base) MCG/ACT inhaler Commonly known as:  PROVENTIL HFA;VENTOLIN HFA Inhale 2 puffs into the lungs every 6 (six) hours as needed for wheezing or shortness of breath.   ALPRAZolam 0.5 MG tablet Commonly known as:  XANAX Take 1 tablet (0.5 mg total) by mouth at bedtime as needed for anxiety. Take once a day by mouth as needed   aspirin 81 MG chewable tablet Chew 1 tablet (81 mg total) by mouth daily.  beclomethasone 40 MCG/ACT inhaler Commonly known as:  QVAR Inhale 2 puffs into the lungs 2 (two) times daily.   calcium carbonate 500 MG chewable tablet Commonly known as:  TUMS - dosed in mg elemental calcium Chew 2 tablets by mouth 3 (three) times daily as needed for indigestion or heartburn.   Prenatal Vitamin 27-0.8 MG Tabs Take 1 tablet by mouth every morning. What changed:  how much to take       Sharen Counter Certified Nurse-Midwife 08/13/2016 8:04 PM

## 2016-08-13 NOTE — MAU Note (Signed)
Urine sent to lab 

## 2016-08-14 ENCOUNTER — Encounter: Payer: Medicaid Other | Admitting: Obstetrics & Gynecology

## 2016-08-16 ENCOUNTER — Other Ambulatory Visit (HOSPITAL_COMMUNITY)
Admission: RE | Admit: 2016-08-16 | Discharge: 2016-08-16 | Disposition: A | Discharge status: Home / Self Care | Payer: Medicaid Other | Source: Ambulatory Visit | Attending: Obstetrics & Gynecology | Admitting: Obstetrics & Gynecology

## 2016-08-16 ENCOUNTER — Ambulatory Visit (INDEPENDENT_AMBULATORY_CARE_PROVIDER_SITE_OTHER): Payer: Medicaid Other | Admitting: Obstetrics & Gynecology

## 2016-08-16 VITALS — BP 104/69 | HR 116 | Wt 126.0 lb

## 2016-08-16 DIAGNOSIS — Z113 Encounter for screening for infections with a predominantly sexual mode of transmission: Secondary | ICD-10-CM

## 2016-08-16 DIAGNOSIS — Z3493 Encounter for supervision of normal pregnancy, unspecified, third trimester: Secondary | ICD-10-CM

## 2016-08-16 DIAGNOSIS — O34219 Maternal care for unspecified type scar from previous cesarean delivery: Secondary | ICD-10-CM

## 2016-08-16 NOTE — Progress Notes (Signed)
Subjective:  Kim [name] is a 26 y.o. [phone] at 2419w3d being seen today for ongoing prenatal care.  She is currently monitored for the following issues for this low-risk pregnancy and has Supervision of normal pregnancy; History of diabetes as a child and gestational diabetes; Underweight; Previous cesarean section complicating pregnancy, antepartum condition or complication; H/O pre-eclampsia in prior pregnancy, currently pregnant; Asthma, moderate persistent, well-controlled; Tobacco smoking affecting pregnancy, antepartum; Bipolar depression (HCC); Asthma affecting pregnancy, antepartum; Short interval between pregnancies affecting pregnancy, antepartum; GBS bacteriuria; and Anemia of mother in pregnancy, antepartum on her problem list.  Patient reports occasional contractions.  Contractions: Irregular. Vag. Bleeding: None.  Movement: Present. Denies leaking of fluid.   The following portions of the patient's history were reviewed and updated as appropriate: allergies, current medications, past family history, past medical history, past social history, past surgical history and problem list. Problem list updated.  Objective:   Vitals:   08/16/16 1527  BP: 104/69  Pulse: (!) 116  Weight: 126 lb (57.2 kg)    Fetal Status: Fetal Heart Rate (bpm): 133 Fundal Height: 36 cm Movement: Present  Presentation: Vertex  General:  Alert, oriented and cooperative. Patient is in no acute distress.  Skin: Skin is warm and dry. No rash noted.   Cardiovascular: Normal heart rate noted  Respiratory: Normal respiratory effort, no problems with respiration noted  Abdomen: Soft, gravid, appropriate for gestational age. Pain/Pressure: Present     Pelvic:  Cervical exam performed Dilation: Fingertip Effacement (%): Thick Station: Ballotable  Extremities: Normal range of motion.  Edema: None  Mental Status: Normal mood and affect. Normal behavior. Normal judgment and thought content.   Urinalysis: Urine  Protein: Negative Urine Glucose: Negative  Assessment and Plan:  Pregnancy: [id number] at 7419w3d  1. Previous cesarean section complicating pregnancy, antepartum condition or complication RCS and BTS on 09/05/16.    2. Supervision of normal pregnancy, third trimester - Urine cytology ancillary only. Already GBS positive. Preterm labor symptoms and general obstetric precautions including but not limited to vaginal bleeding, contractions, leaking of fluid and fetal movement were reviewed in detail with the patient. Please refer to After Visit Summary for other counseling recommendations.  Return in about 1 week (around 08/23/2016) for OB Visit.   Tereso NewcomerUgonna A Rayshad Riviello, MD

## 2016-08-16 NOTE — Patient Instructions (Signed)
Return to clinic for any scheduled appointments or for any gynecologic concerns as needed.   

## 2016-08-20 LAB — URINE CYTOLOGY ANCILLARY ONLY
Chlamydia: NEGATIVE
Neisseria Gonorrhea: NEGATIVE

## 2016-08-23 ENCOUNTER — Encounter (HOSPITAL_COMMUNITY): Payer: Self-pay | Admitting: *Deleted

## 2016-08-23 ENCOUNTER — Inpatient Hospital Stay (HOSPITAL_COMMUNITY)
Admission: AD | Admit: 2016-08-23 | Discharge: 2016-08-23 | Disposition: A | Discharge status: Home / Self Care | Payer: Medicaid Other | Source: Ambulatory Visit | Attending: Obstetrics and Gynecology | Admitting: Obstetrics and Gynecology

## 2016-08-23 ENCOUNTER — Ambulatory Visit (INDEPENDENT_AMBULATORY_CARE_PROVIDER_SITE_OTHER): Payer: Medicaid Other | Admitting: Obstetrics & Gynecology

## 2016-08-23 VITALS — BP 104/71 | HR 120 | Wt 126.0 lb

## 2016-08-23 DIAGNOSIS — Z8639 Personal history of other endocrine, nutritional and metabolic disease: Secondary | ICD-10-CM

## 2016-08-23 DIAGNOSIS — O99343 Other mental disorders complicating pregnancy, third trimester: Secondary | ICD-10-CM | POA: Diagnosis not present

## 2016-08-23 DIAGNOSIS — F419 Anxiety disorder, unspecified: Secondary | ICD-10-CM | POA: Insufficient documentation

## 2016-08-23 DIAGNOSIS — Z3493 Encounter for supervision of normal pregnancy, unspecified, third trimester: Secondary | ICD-10-CM

## 2016-08-23 DIAGNOSIS — O99513 Diseases of the respiratory system complicating pregnancy, third trimester: Secondary | ICD-10-CM | POA: Insufficient documentation

## 2016-08-23 DIAGNOSIS — M797 Fibromyalgia: Secondary | ICD-10-CM | POA: Diagnosis not present

## 2016-08-23 DIAGNOSIS — D649 Anemia, unspecified: Secondary | ICD-10-CM | POA: Diagnosis not present

## 2016-08-23 DIAGNOSIS — Z3483 Encounter for supervision of other normal pregnancy, third trimester: Secondary | ICD-10-CM

## 2016-08-23 DIAGNOSIS — O99519 Diseases of the respiratory system complicating pregnancy, unspecified trimester: Secondary | ICD-10-CM

## 2016-08-23 DIAGNOSIS — O09293 Supervision of pregnancy with other poor reproductive or obstetric history, third trimester: Secondary | ICD-10-CM

## 2016-08-23 DIAGNOSIS — J45909 Unspecified asthma, uncomplicated: Secondary | ICD-10-CM | POA: Diagnosis not present

## 2016-08-23 DIAGNOSIS — O99013 Anemia complicating pregnancy, third trimester: Secondary | ICD-10-CM | POA: Diagnosis not present

## 2016-08-23 DIAGNOSIS — R0602 Shortness of breath: Secondary | ICD-10-CM | POA: Diagnosis present

## 2016-08-23 DIAGNOSIS — O99333 Smoking (tobacco) complicating pregnancy, third trimester: Secondary | ICD-10-CM | POA: Insufficient documentation

## 2016-08-23 DIAGNOSIS — F329 Major depressive disorder, single episode, unspecified: Secondary | ICD-10-CM | POA: Diagnosis not present

## 2016-08-23 DIAGNOSIS — O09899 Supervision of other high risk pregnancies, unspecified trimester: Secondary | ICD-10-CM

## 2016-08-23 DIAGNOSIS — Z3A37 37 weeks gestation of pregnancy: Secondary | ICD-10-CM | POA: Diagnosis not present

## 2016-08-23 DIAGNOSIS — O26893 Other specified pregnancy related conditions, third trimester: Secondary | ICD-10-CM | POA: Insufficient documentation

## 2016-08-23 DIAGNOSIS — R002 Palpitations: Secondary | ICD-10-CM | POA: Diagnosis not present

## 2016-08-23 DIAGNOSIS — Z7982 Long term (current) use of aspirin: Secondary | ICD-10-CM | POA: Insufficient documentation

## 2016-08-23 DIAGNOSIS — R8271 Bacteriuria: Secondary | ICD-10-CM

## 2016-08-23 LAB — URINE MICROSCOPIC-ADD ON: BACTERIA UA: NONE SEEN

## 2016-08-23 LAB — URINALYSIS, ROUTINE W REFLEX MICROSCOPIC
Bilirubin Urine: NEGATIVE
GLUCOSE, UA: NEGATIVE mg/dL
Ketones, ur: NEGATIVE mg/dL
Leukocytes, UA: NEGATIVE
Nitrite: NEGATIVE
PH: 6 (ref 5.0–8.0)
PROTEIN: NEGATIVE mg/dL
Specific Gravity, Urine: 1.01 (ref 1.005–1.030)

## 2016-08-23 LAB — CBC
HEMATOCRIT: 28.4 % — AB (ref 36.0–46.0)
Hemoglobin: 9.6 g/dL — ABNORMAL LOW (ref 12.0–15.0)
MCH: 29.6 pg (ref 26.0–34.0)
MCHC: 33.8 g/dL (ref 30.0–36.0)
MCV: 87.7 fL (ref 78.0–100.0)
PLATELETS: 224 10*3/uL (ref 150–400)
RBC: 3.24 MIL/uL — ABNORMAL LOW (ref 3.87–5.11)
RDW: 13.3 % (ref 11.5–15.5)
WBC: 11.1 10*3/uL — ABNORMAL HIGH (ref 4.0–10.5)

## 2016-08-23 NOTE — MAU Note (Signed)
Pt states she has been having heart palpitations for 2 days.  Was at regular OB appt [facility]([place]) and was sent to MAU.  Pt states she did this with one of her other pregnancies.  Denies Bleeding, ROM, or abnormal discharge.  Good fetal movement.  Pressure pain only.

## 2016-08-23 NOTE — Progress Notes (Signed)
Subjective:  Kim [name] is a 26 y.o. [phone] at 7109w3d being seen today for ongoing prenatal care.  She is currently monitored for the following issues for this low-risk pregnancy and has Supervision of normal pregnancy; History of diabetes as a child and gestational diabetes; Underweight; Previous cesarean section complicating pregnancy, antepartum condition or complication; H/O pre-eclampsia in prior pregnancy, currently pregnant; Asthma, moderate persistent, well-controlled; Tobacco smoking affecting pregnancy, antepartum; Bipolar depression (HCC); Asthma affecting pregnancy, antepartum; Short interval between pregnancies affecting pregnancy, antepartum; GBS bacteriuria; and Anemia of mother in pregnancy, antepartum on her problem list.  Patient reports very SOB for 2 days, has asthma.  Contractions: Irregular. Vag. Bleeding: None.  Movement: Present. Denies leaking of fluid.   The following portions of the patient's history were reviewed and updated as appropriate: allergies, current medications, past family history, past medical history, past social history, past surgical history and problem list. Problem list updated.  Objective:   Vitals:   08/23/16 0946  BP: 104/71  Pulse: (!) 120  Weight: 126 lb (57.2 kg)    Fetal Status: Fetal Heart Rate (bpm): 141   Movement: Present     General:  Alert, oriented and cooperative. Patient is in no acute distress.  Skin: Skin is warm and dry. No rash noted.   Cardiovascular: No m,r,g, tachy  Respiratory: CTAB, tachypnic, obvious resp effort  Abdomen: Soft, gravid, appropriate for gestational age. Pain/Pressure: Present     Pelvic:  Cervical exam performed        Extremities: Normal range of motion.     Mental Status: Normal mood and affect. Normal behavior. Normal judgment and thought content.   Urinalysis: Urine Protein: Negative Urine Glucose: Negative  Assessment and Plan:  Pregnancy: [id number] at 36109w3d  1. Supervision of normal  pregnancy, third trimester   2. H/O pre-eclampsia in prior pregnancy, currently pregnant, third trimester - baby asa daily  3. Anemia of mother in pregnancy, antepartum, third trimester   4. History of diabetes as a child and gestational diabetes   5. Short interval between pregnancies affecting pregnancy, antepartum  6. [facility]  Term labor symptoms and general obstetric precautions including but not limited to vaginal bleeding, contractions, leaking of fluid and fetal movement were reviewed in detail with the patient. Please refer to After Visit Summary for other counseling recommendations.  No Follow-up on file.   Allie BossierMyra C Day Deery, MD

## 2016-08-23 NOTE — MAU Provider Note (Signed)
History     CSN: [id number]  Arrival date and time: 08/23/16 1200   First Provider Initiated Contact with Patient 08/23/16 1247       HPI   Kim [name] Is a 26 year old G5 P3 at 37 weeks and 3 days who presents from the office with worsening shortness of breath. She reports for the past days and she gets up and moves around she's becomes significantly short of breath. This happened towards the end of the previous pregnancy and she states she was delivered early because of it. She was tachycardic to the 130s and 120s in the office today but on reevaluation in the MAU she's anytime between 95 and 125 bpm. She denies any chest pain or shortness of breath at this time. She does have a history of anxiety. She denies fevers chills nausea vomiting. She does report she is occasionally getting hot flashes. She has no lightheadedness or dizziness. She is scheduled for repeat C-section on September 6  OB History    Gravida Para Term Preterm AB Living   5 3 3   1 3    SAB TAB Ectopic Multiple Live Births   1     0 3      Past Medical History:  Diagnosis Date  . Anemia   . Anxiety   . Asthma   . Chest pain   . Depression   . Fibromyalgia   . Gestational diabetes 2010, 2012  . Heartburn during pregnancy   . Rectal bleeding   . Seizures (HCC) 2015   x 1- unknown reason- passed out  . Ulcer     Past Surgical History:  Procedure Laterality Date  . CESAREAN SECTION     x 2  . CESAREAN SECTION WITH BILATERAL TUBAL LIGATION Bilateral 07/03/2015   Procedure: REPEAT CESAREAN SECTION ;  Surgeon: Tereso NewcomerUgonna A Anyanwu, MD;  Location: [facility];  Service: Obstetrics;  Laterality: Bilateral;  . WISDOM TOOTH EXTRACTION      Family History  Problem Relation Age of Onset  . Depression Mother   . Fibromyalgia Mother   . Diabetes Maternal Grandmother   . Hypertension Maternal Grandmother   . Arthritis Maternal Grandmother   . Thyroid disease Maternal Grandmother     Social History  Substance Use  Topics  . Smoking status: Current Some Day Smoker    Packs/day: 0.50    Types: Cigarettes  . Smokeless tobacco: Current User  . Alcohol use No    Allergies: No Known Allergies  Prescriptions Prior to Admission  Medication Sig Dispense Refill Last Dose  . acetaminophen (TYLENOL) 500 MG tablet Take 1,000 mg by mouth every 6 (six) hours as needed for mild pain or headache.   Taking  . albuterol (PROVENTIL HFA;VENTOLIN HFA) 108 (90 BASE) MCG/ACT inhaler Inhale 2 puffs into the lungs every 6 (six) hours as needed for wheezing or shortness of breath. 18 g 5 Taking  . ALPRAZolam (XANAX) 0.5 MG tablet Take 1 tablet (0.5 mg total) by mouth at bedtime as needed for anxiety. Take once a day by mouth as needed 30 tablet 3 Taking  . aspirin 81 MG chewable tablet Chew 1 tablet (81 mg total) by mouth daily. 90 tablet 3 Taking  . beclomethasone (QVAR) 40 MCG/ACT inhaler Inhale 2 puffs into the lungs 2 (two) times daily. 1 Inhaler 12 Taking  . calcium carbonate (TUMS - DOSED IN MG ELEMENTAL CALCIUM) 500 MG chewable tablet Chew 2 tablets by mouth 3 (three) times daily as needed for  indigestion or heartburn.   Taking  . Prenatal Vit-Fe Fumarate-FA (PRENATAL VITAMIN) 27-0.8 MG TABS Take 1 tablet by mouth every morning. (Patient taking differently: Take 2 tablets by mouth every morning. ) 30 tablet 0 Taking    Review of Systems  Constitutional: Negative for chills and fever.  HENT: Negative for congestion.   Eyes: Negative for blurred vision and double vision.  Respiratory: Positive for shortness of breath. Negative for cough and sputum production.   Cardiovascular: Positive for palpitations. Negative for chest pain and orthopnea.  Gastrointestinal: Positive for nausea. Negative for abdominal pain, heartburn and vomiting.  Genitourinary: Negative for dysuria and urgency.  Musculoskeletal: Negative for back pain and myalgias.  Skin: Negative for itching and rash.  Neurological: Negative for dizziness,  tingling and headaches.  Endo/Heme/Allergies: Negative for environmental allergies. Does not bruise/bleed easily.   Physical Exam   Blood pressure 119/60, pulse 84, temperature 98.1 F (36.7 C), temperature source Oral, resp. rate 18, last menstrual period 12/04/2015, SpO2 100 %, unknown if currently breastfeeding.  Physical Exam  Constitutional: She is oriented to person, place, and time. She appears well-developed and well-nourished.  Thin  HENT:  Head: Normocephalic and atraumatic.  Cardiovascular: Regular rhythm and normal heart sounds.  Exam reveals no gallop and no friction rub.   No murmur heard. Minimally tachycardic  Respiratory: Effort normal and breath sounds normal. No respiratory distress. She has no wheezes.  GI: Soft. Bowel sounds are normal. She exhibits no distension.  Gravid  Musculoskeletal: Normal range of motion. She exhibits no edema or deformity.  Neurological: She is alert and oriented to person, place, and time.  Skin: Skin is warm.  Psychiatric: She has a normal mood and affect. Her behavior is normal.    MAU Course  Procedures  MDM In the MAU patient did have an EKG which revealed sinus tachycardia with no other abnormalities. She did initially have a minimally elevated heart rate in the low 100s but upon discharge patient's heart rate was 76. Baby did have a reactive NST while patient was observed in the MAU. We did check a urinalysis and hemoglobin both of which were stable. The hemoglobin showing no change since last check about a month ago which was 9.6 and the urinalysis showed no dehydration  Assessment and Plan  #1 palpitations patient was discharged home with supportive care. Advised these may be related to anxiety offered Vistaril but patient did not want this medication. Advised plenty of hydration. Patient to return if symptoms worsen.  Kim [name] 08/23/2016, 2:07 PM

## 2016-08-23 NOTE — Discharge Instructions (Signed)

## 2016-08-23 NOTE — Progress Notes (Signed)
Pt here today for OB f/u, c/o SOB for 2 days. Has a hx asthma and states inhaler is not helping.  Also states she "feels that her heart is beating out of her chest".  Pulse via dinamap = 138, pulse recheck manually = 120.

## 2016-08-24 ENCOUNTER — Encounter (HOSPITAL_COMMUNITY): Payer: Self-pay

## 2016-08-30 ENCOUNTER — Ambulatory Visit (INDEPENDENT_AMBULATORY_CARE_PROVIDER_SITE_OTHER): Payer: Medicaid Other | Admitting: Family Medicine

## 2016-08-30 VITALS — BP 115/75 | HR 102 | Wt 129.0 lb

## 2016-08-30 DIAGNOSIS — O34219 Maternal care for unspecified type scar from previous cesarean delivery: Secondary | ICD-10-CM

## 2016-08-30 DIAGNOSIS — Z3493 Encounter for supervision of normal pregnancy, unspecified, third trimester: Secondary | ICD-10-CM | POA: Diagnosis not present

## 2016-08-30 DIAGNOSIS — R42 Dizziness and giddiness: Secondary | ICD-10-CM

## 2016-08-30 MED ORDER — MECLIZINE HCL 12.5 MG PO TABS: 12.5000 mg | ORAL_TABLET | Freq: Three times a day (TID) | ORAL | 0 refills | Status: DC | PRN

## 2016-08-30 NOTE — Progress Notes (Signed)
   PRENATAL VISIT NOTE  Subjective:  Kim [name] is a 26 y.o. [phone] at 4820w3d being seen today for ongoing prenatal care.  She is currently monitored for the following issues for this high-risk pregnancy and has Supervision of normal pregnancy; History of diabetes as a child and gestational diabetes; Underweight; Previous cesarean section complicating pregnancy, antepartum condition or complication; H/O pre-eclampsia in prior pregnancy, currently pregnant; Asthma, moderate persistent, well-controlled; Tobacco smoking affecting pregnancy, antepartum; Bipolar depression (HCC); Asthma affecting pregnancy, antepartum; Short interval between pregnancies affecting pregnancy, antepartum; GBS bacteriuria; and Anemia of mother in pregnancy, antepartum on her problem list.  Patient reports vertigo, sometimes a couple times a day.  Last for several hours.  No tinnitus, hearing loss, headaches.  Contractions: Irregular. Vag. Bleeding: None.  Movement: Present. Denies leaking of fluid.   The following portions of the patient's history were reviewed and updated as appropriate: allergies, current medications, past family history, past medical history, past social history, past surgical history and problem list. Problem list updated.  Objective:   Vitals:   08/30/16 1021  BP: 115/75  Pulse: (!) 102  Weight: 129 lb (58.5 kg)    Fetal Status: Fetal Heart Rate (bpm): 134   Movement: Present     General:  Alert, oriented and cooperative. Patient is in no acute distress.  Skin: Skin is warm and dry. No rash noted.   Cardiovascular: Normal heart rate noted  Respiratory: Normal respiratory effort, no problems with respiration noted  Abdomen: Soft, gravid, appropriate for gestational age. Pain/Pressure: Present     Pelvic:  Cervical exam deferred        Extremities: Normal range of motion.  Edema: None  Mental Status: Normal mood and affect. Normal behavior. Normal judgment and thought content.    Urinalysis: Urine Protein: Negative Urine Glucose: Negative  Assessment and Plan:  Pregnancy: [id number] at 720w3d  1. Supervision of normal pregnancy, third trimester FHT and FH normal  2. Previous cesarean section complicating pregnancy, antepartum condition or complication Repeat next week.  3. Vertigo Meclizine.  Term labor symptoms and general obstetric precautions including but not limited to vaginal bleeding, contractions, leaking of fluid and fetal movement were reviewed in detail with the patient. Please refer to After Visit Summary for other counseling recommendations.  No Follow-up on file.  Levie HeritageJacob J Danarius Mcconathy, DO

## 2016-08-30 NOTE — Progress Notes (Signed)
Pt states that she has been feeling dizzy and has been experiencing nausea. She is also experiencing lower back pain and hip pain.

## 2016-08-31 NOTE — Patient Instructions (Signed)
20 Kim CooleyJessica R [name]  08/31/2016   Your procedure is scheduled on:  09/05/2016  [facility] through [facility] at 1100 AM.  Pick up the phone at the desk and dial [phone].   Call this number if you have problems the morning of surgery: [phone]   Remember:   Do not eat food:After Midnight.  Do not drink clear liquids: After Midnight.  Take these medicines the morning of surgery with A SIP OF WATER: none.  DO NOT TAKE XANAX ON THE DAY OF SURGERY   Do not wear jewelry, make-up or nail polish.  Do not wear lotions, powders, or perfumes. Do not wear deodorant.  Do not shave 48 hours prior to surgery.  Do not bring valuables to the hospital.  [facility] is not   responsible for any belongings or valuables brought to the hospital.  Contacts, dentures or bridgework may not be worn into surgery.  Leave suitcase in the car. After surgery it may be brought to your room.  For patients admitted to the hospital, checkout time is 11:00 AM the day of              discharge.   Patients discharged the day of surgery will not be allowed to drive             home.  Name and phone number of your driver: NA  Special Instructions:   N/A   Please read over the following fact sheets that you were given:   Surgical Site Infection Prevention

## 2016-09-04 ENCOUNTER — Encounter (HOSPITAL_COMMUNITY)
Admission: RE | Admit: 2016-09-04 | Discharge: 2016-09-04 | Disposition: A | Discharge status: Home / Self Care | Payer: Medicaid Other | Source: Ambulatory Visit | Attending: Obstetrics & Gynecology | Admitting: Obstetrics & Gynecology

## 2016-09-04 LAB — CBC
HCT: 30 % — ABNORMAL LOW (ref 36.0–46.0)
Hemoglobin: 10 g/dL — ABNORMAL LOW (ref 12.0–15.0)
MCH: 29 pg (ref 26.0–34.0)
MCHC: 33.3 g/dL (ref 30.0–36.0)
MCV: 87 fL (ref 78.0–100.0)
PLATELETS: 231 10*3/uL (ref 150–400)
RBC: 3.45 MIL/uL — AB (ref 3.87–5.11)
RDW: 13.6 % (ref 11.5–15.5)
WBC: 9.9 10*3/uL (ref 4.0–10.5)

## 2016-09-04 LAB — TYPE AND SCREEN
ABO/RH(D): O POS
Antibody Screen: NEGATIVE

## 2016-09-05 ENCOUNTER — Encounter (HOSPITAL_COMMUNITY): Payer: Self-pay | Admitting: Anesthesiology

## 2016-09-05 ENCOUNTER — Inpatient Hospital Stay (HOSPITAL_COMMUNITY)
Admission: RE | Admit: 2016-09-05 | Discharge: 2016-09-10 | DRG: 765 | Disposition: A | Discharge status: Home / Self Care | Payer: Medicaid Other | Source: Ambulatory Visit | Attending: Obstetrics and Gynecology | Admitting: Obstetrics and Gynecology

## 2016-09-05 ENCOUNTER — Encounter (HOSPITAL_COMMUNITY)
Admission: RE | Disposition: A | Discharge status: Home / Self Care | Payer: Self-pay | Source: Home / Self Care | Attending: Obstetrics and Gynecology

## 2016-09-05 ENCOUNTER — Inpatient Hospital Stay (HOSPITAL_COMMUNITY): Payer: Medicaid Other | Admitting: Anesthesiology

## 2016-09-05 DIAGNOSIS — Z833 Family history of diabetes mellitus: Secondary | ICD-10-CM | POA: Diagnosis not present

## 2016-09-05 DIAGNOSIS — O99284 Endocrine, nutritional and metabolic diseases complicating childbirth: Secondary | ICD-10-CM | POA: Diagnosis present

## 2016-09-05 DIAGNOSIS — O9962 Diseases of the digestive system complicating childbirth: Secondary | ICD-10-CM | POA: Diagnosis present

## 2016-09-05 DIAGNOSIS — F319 Bipolar disorder, unspecified: Secondary | ICD-10-CM | POA: Diagnosis present

## 2016-09-05 DIAGNOSIS — E860 Dehydration: Secondary | ICD-10-CM | POA: Diagnosis present

## 2016-09-05 DIAGNOSIS — Z7982 Long term (current) use of aspirin: Secondary | ICD-10-CM | POA: Diagnosis not present

## 2016-09-05 DIAGNOSIS — Z3A39 39 weeks gestation of pregnancy: Secondary | ICD-10-CM

## 2016-09-05 DIAGNOSIS — J45909 Unspecified asthma, uncomplicated: Secondary | ICD-10-CM | POA: Diagnosis present

## 2016-09-05 DIAGNOSIS — O34211 Maternal care for low transverse scar from previous cesarean delivery: Principal | ICD-10-CM | POA: Diagnosis present

## 2016-09-05 DIAGNOSIS — Z302 Encounter for sterilization: Secondary | ICD-10-CM | POA: Diagnosis not present

## 2016-09-05 DIAGNOSIS — O99824 Streptococcus B carrier state complicating childbirth: Secondary | ICD-10-CM | POA: Diagnosis present

## 2016-09-05 DIAGNOSIS — K9189 Other postprocedural complications and disorders of digestive system: Secondary | ICD-10-CM | POA: Diagnosis not present

## 2016-09-05 DIAGNOSIS — K56609 Unspecified intestinal obstruction, unspecified as to partial versus complete obstruction: Secondary | ICD-10-CM

## 2016-09-05 DIAGNOSIS — G8918 Other acute postprocedural pain: Secondary | ICD-10-CM

## 2016-09-05 DIAGNOSIS — Z9851 Tubal ligation status: Secondary | ICD-10-CM

## 2016-09-05 DIAGNOSIS — F17210 Nicotine dependence, cigarettes, uncomplicated: Secondary | ICD-10-CM | POA: Diagnosis present

## 2016-09-05 DIAGNOSIS — K567 Ileus, unspecified: Secondary | ICD-10-CM | POA: Diagnosis not present

## 2016-09-05 DIAGNOSIS — O9952 Diseases of the respiratory system complicating childbirth: Secondary | ICD-10-CM | POA: Diagnosis present

## 2016-09-05 DIAGNOSIS — O99334 Smoking (tobacco) complicating childbirth: Secondary | ICD-10-CM | POA: Diagnosis present

## 2016-09-05 DIAGNOSIS — Z8249 Family history of ischemic heart disease and other diseases of the circulatory system: Secondary | ICD-10-CM

## 2016-09-05 DIAGNOSIS — Z98891 History of uterine scar from previous surgery: Secondary | ICD-10-CM

## 2016-09-05 DIAGNOSIS — O99344 Other mental disorders complicating childbirth: Secondary | ICD-10-CM | POA: Diagnosis present

## 2016-09-05 DIAGNOSIS — N858 Other specified noninflammatory disorders of uterus: Secondary | ICD-10-CM | POA: Diagnosis not present

## 2016-09-05 LAB — RPR: RPR Ser Ql: NONREACTIVE

## 2016-09-05 SURGERY — Surgical Case
Anesthesia: Spinal | Laterality: Bilateral

## 2016-09-05 MED ORDER — LACTATED RINGERS IV SOLN
INTRAVENOUS | Status: DC
  Administered 2016-09-05: 13:00:00 via INTRAVENOUS

## 2016-09-05 MED ORDER — OXYCODONE-ACETAMINOPHEN 5-325 MG PO TABS: 2.0000 | ORAL_TABLET | ORAL | Status: DC | PRN

## 2016-09-05 MED ORDER — OXYTOCIN 40 UNITS IN LACTATED RINGERS INFUSION - SIMPLE MED: 2.5000 [IU]/h | INTRAVENOUS | Status: AC

## 2016-09-05 MED ORDER — PHENYLEPHRINE 40 MCG/ML (10ML) SYRINGE FOR IV PUSH (FOR BLOOD PRESSURE SUPPORT)
PREFILLED_SYRINGE | INTRAVENOUS | Status: AC
  Filled 2016-09-05: qty 10

## 2016-09-05 MED ORDER — DIBUCAINE 1 % RE OINT: 1.0000 "application " | TOPICAL_OINTMENT | RECTAL | Status: DC | PRN

## 2016-09-05 MED ORDER — MENTHOL 3 MG MT LOZG: 1.0000 | LOZENGE | OROMUCOSAL | Status: DC | PRN

## 2016-09-05 MED ORDER — COCONUT OIL OIL: 1.0000 "application " | TOPICAL_OIL | Status: DC | PRN

## 2016-09-05 MED ORDER — PHENYLEPHRINE 8 MG IN D5W 100 ML (0.08MG/ML) PREMIX OPTIME
INJECTION | INTRAVENOUS | Status: AC
  Filled 2016-09-05: qty 100

## 2016-09-05 MED ORDER — SIMETHICONE 80 MG PO CHEW
80.0000 mg | CHEWABLE_TABLET | ORAL | Status: DC
  Administered 2016-09-05 – 2016-09-06 (×2): 80 mg via ORAL
  Filled 2016-09-05 (×2): qty 1

## 2016-09-05 MED ORDER — KETOROLAC TROMETHAMINE 30 MG/ML IJ SOLN
INTRAMUSCULAR | Status: AC
  Administered 2016-09-05: 30 mg via INTRAVENOUS
  Filled 2016-09-05: qty 1

## 2016-09-05 MED ORDER — OXYTOCIN 10 UNIT/ML IJ SOLN
INTRAMUSCULAR | Status: AC
  Filled 2016-09-05: qty 4

## 2016-09-05 MED ORDER — ACETAMINOPHEN 500 MG PO TABS
1000.0000 mg | ORAL_TABLET | Freq: Four times a day (QID) | ORAL | Status: AC
  Administered 2016-09-05 – 2016-09-06 (×3): 1000 mg via ORAL
  Filled 2016-09-05 (×3): qty 2

## 2016-09-05 MED ORDER — ONDANSETRON HCL 4 MG/2ML IJ SOLN
INTRAMUSCULAR | Status: DC | PRN
  Administered 2016-09-05: 4 mg via INTRAVENOUS

## 2016-09-05 MED ORDER — IBUPROFEN 600 MG PO TABS
600.0000 mg | ORAL_TABLET | Freq: Four times a day (QID) | ORAL | Status: DC
  Administered 2016-09-05 – 2016-09-08 (×9): 600 mg via ORAL
  Filled 2016-09-05 (×10): qty 1

## 2016-09-05 MED ORDER — SCOPOLAMINE 1 MG/3DAYS TD PT72
1.0000 | MEDICATED_PATCH | Freq: Once | TRANSDERMAL | Status: DC
  Administered 2016-09-05: 1.5 mg via TRANSDERMAL

## 2016-09-05 MED ORDER — SIMETHICONE 80 MG PO CHEW
80.0000 mg | CHEWABLE_TABLET | Freq: Three times a day (TID) | ORAL | Status: DC
  Administered 2016-09-05 – 2016-09-10 (×13): 80 mg via ORAL
  Filled 2016-09-05 (×13): qty 1

## 2016-09-05 MED ORDER — NALBUPHINE HCL 10 MG/ML IJ SOLN: 5.0000 mg | Freq: Once | INTRAMUSCULAR | Status: DC | PRN

## 2016-09-05 MED ORDER — PRENATAL MULTIVITAMIN CH
1.0000 | ORAL_TABLET | Freq: Every day | ORAL | Status: DC
  Administered 2016-09-06 – 2016-09-09 (×2): 1 via ORAL
  Filled 2016-09-05 (×2): qty 1

## 2016-09-05 MED ORDER — CEFAZOLIN SODIUM-DEXTROSE 2-3 GM-% IV SOLR
INTRAVENOUS | Status: DC | PRN
  Administered 2016-09-05: 2 g via INTRAVENOUS

## 2016-09-05 MED ORDER — SIMETHICONE 80 MG PO CHEW: 80.0000 mg | CHEWABLE_TABLET | ORAL | Status: DC | PRN

## 2016-09-05 MED ORDER — OXYCODONE-ACETAMINOPHEN 5-325 MG PO TABS: 1.0000 | ORAL_TABLET | ORAL | Status: DC | PRN

## 2016-09-05 MED ORDER — MORPHINE SULFATE-NACL 0.5-0.9 MG/ML-% IV SOSY
PREFILLED_SYRINGE | INTRAVENOUS | Status: AC
  Filled 2016-09-05: qty 1

## 2016-09-05 MED ORDER — SCOPOLAMINE 1 MG/3DAYS TD PT72
MEDICATED_PATCH | TRANSDERMAL | Status: AC
  Filled 2016-09-05: qty 1

## 2016-09-05 MED ORDER — ACETAMINOPHEN 160 MG/5ML PO SOLN
ORAL | Status: AC
  Filled 2016-09-05: qty 40.6

## 2016-09-05 MED ORDER — DIPHENHYDRAMINE HCL 50 MG/ML IJ SOLN
12.5000 mg | INTRAMUSCULAR | Status: DC | PRN
  Administered 2016-09-07: 12.5 mg via INTRAVENOUS
  Filled 2016-09-05: qty 1

## 2016-09-05 MED ORDER — ACETAMINOPHEN 160 MG/5ML PO SOLN
975.0000 mg | Freq: Four times a day (QID) | ORAL | Status: AC | PRN
  Administered 2016-09-05: 975 mg via ORAL

## 2016-09-05 MED ORDER — DEXAMETHASONE SODIUM PHOSPHATE 4 MG/ML IJ SOLN
INTRAMUSCULAR | Status: AC
  Filled 2016-09-05: qty 1

## 2016-09-05 MED ORDER — FENTANYL CITRATE (PF) 100 MCG/2ML IJ SOLN
25.0000 ug | INTRAMUSCULAR | Status: DC | PRN
  Administered 2016-09-05: 25 ug via INTRAVENOUS
  Administered 2016-09-05: 50 ug via INTRAVENOUS

## 2016-09-05 MED ORDER — DEXAMETHASONE SODIUM PHOSPHATE 4 MG/ML IJ SOLN
INTRAMUSCULAR | Status: DC | PRN
  Administered 2016-09-05: 4 mg via INTRAVENOUS

## 2016-09-05 MED ORDER — SODIUM CHLORIDE 0.9% FLUSH: 3.0000 mL | INTRAVENOUS | Status: DC | PRN

## 2016-09-05 MED ORDER — KETOROLAC TROMETHAMINE 30 MG/ML IJ SOLN
30.0000 mg | Freq: Once | INTRAMUSCULAR | Status: AC
  Administered 2016-09-05: 30 mg via INTRAVENOUS

## 2016-09-05 MED ORDER — ONDANSETRON HCL 4 MG/2ML IJ SOLN: 4.0000 mg | Freq: Three times a day (TID) | INTRAMUSCULAR | Status: DC | PRN

## 2016-09-05 MED ORDER — ONDANSETRON HCL 4 MG/2ML IJ SOLN
INTRAMUSCULAR | Status: AC
  Filled 2016-09-05: qty 2

## 2016-09-05 MED ORDER — DIPHENHYDRAMINE HCL 25 MG PO CAPS: 25.0000 mg | ORAL_CAPSULE | Freq: Four times a day (QID) | ORAL | Status: DC | PRN

## 2016-09-05 MED ORDER — NALBUPHINE HCL 10 MG/ML IJ SOLN: 5.0000 mg | INTRAMUSCULAR | Status: DC | PRN

## 2016-09-05 MED ORDER — LACTATED RINGERS IV SOLN: INTRAVENOUS | Status: DC

## 2016-09-05 MED ORDER — FENTANYL CITRATE (PF) 100 MCG/2ML IJ SOLN
INTRAMUSCULAR | Status: AC
  Filled 2016-09-05: qty 2

## 2016-09-05 MED ORDER — DIPHENHYDRAMINE HCL 25 MG PO CAPS
25.0000 mg | ORAL_CAPSULE | ORAL | Status: DC | PRN
  Administered 2016-09-05 – 2016-09-06 (×3): 25 mg via ORAL
  Filled 2016-09-05 (×3): qty 1

## 2016-09-05 MED ORDER — SENNOSIDES-DOCUSATE SODIUM 8.6-50 MG PO TABS
2.0000 | ORAL_TABLET | ORAL | Status: DC
Start: 2016-09-06 — End: 2016-09-08
  Administered 2016-09-05 – 2016-09-06 (×2): 2 via ORAL
  Filled 2016-09-05 (×2): qty 2

## 2016-09-05 MED ORDER — OXYTOCIN 10 UNIT/ML IJ SOLN
INTRAVENOUS | Status: DC | PRN
  Administered 2016-09-05: 40 [IU] via INTRAVENOUS

## 2016-09-05 MED ORDER — ACETAMINOPHEN 325 MG PO TABS
650.0000 mg | ORAL_TABLET | ORAL | Status: DC | PRN
  Administered 2016-09-06: 650 mg via ORAL
  Filled 2016-09-05: qty 2

## 2016-09-05 MED ORDER — PHENYLEPHRINE 8 MG IN D5W 100 ML (0.08MG/ML) PREMIX OPTIME
INJECTION | INTRAVENOUS | Status: DC | PRN
  Administered 2016-09-05: 60 ug/min via INTRAVENOUS

## 2016-09-05 MED ORDER — NALOXONE HCL 0.4 MG/ML IJ SOLN: 0.4000 mg | INTRAMUSCULAR | Status: DC | PRN

## 2016-09-05 MED ORDER — SODIUM CHLORIDE 0.9 % IR SOLN
Status: DC | PRN
  Administered 2016-09-05: 1

## 2016-09-05 MED ORDER — NALOXONE HCL 2 MG/2ML IJ SOSY
1.0000 ug/kg/h | PREFILLED_SYRINGE | INTRAMUSCULAR | Status: DC | PRN
  Filled 2016-09-05: qty 2

## 2016-09-05 MED ORDER — TETANUS-DIPHTH-ACELL PERTUSSIS 5-2.5-18.5 LF-MCG/0.5 IM SUSP: 0.5000 mL | Freq: Once | INTRAMUSCULAR | Status: DC

## 2016-09-05 MED ORDER — WITCH HAZEL-GLYCERIN EX PADS: 1.0000 "application " | MEDICATED_PAD | CUTANEOUS | Status: DC | PRN

## 2016-09-05 MED ORDER — ZOLPIDEM TARTRATE 5 MG PO TABS
5.0000 mg | ORAL_TABLET | Freq: Every evening | ORAL | Status: DC | PRN
  Administered 2016-09-06: 5 mg via ORAL
  Filled 2016-09-05: qty 1

## 2016-09-05 MED ORDER — PHENYLEPHRINE HCL 10 MG/ML IJ SOLN
INTRAMUSCULAR | Status: DC | PRN
  Administered 2016-09-05: 120 ug via INTRAVENOUS

## 2016-09-05 MED ORDER — LACTATED RINGERS IV SOLN
Freq: Once | INTRAVENOUS | Status: AC
  Administered 2016-09-05 (×3): via INTRAVENOUS

## 2016-09-05 MED ORDER — MEPERIDINE HCL 25 MG/ML IJ SOLN: 6.2500 mg | INTRAMUSCULAR | Status: DC | PRN

## 2016-09-05 SURGICAL SUPPLY — 35 items
CANISTER SUCT 3000ML PPV (MISCELLANEOUS) ×3 IMPLANT
CHLORAPREP W/TINT 26ML (MISCELLANEOUS) ×3 IMPLANT
DRSG OPSITE POSTOP 4X10 (GAUZE/BANDAGES/DRESSINGS) ×3 IMPLANT
ELECT REM PT RETURN 9FT ADLT (ELECTROSURGICAL) ×3
ELECTRODE REM PT RTRN 9FT ADLT (ELECTROSURGICAL) ×1 IMPLANT
GLOVE BIOGEL PI IND STRL 7.0 (GLOVE) ×2 IMPLANT
GLOVE BIOGEL PI INDICATOR 7.0 (GLOVE) ×4
GLOVE INDICATOR 7.5 STRL GRN (GLOVE) ×3 IMPLANT
GLOVE SKINSENSE NS SZ7.0 (GLOVE) ×2
GLOVE SKINSENSE STRL SZ7.0 (GLOVE) ×1 IMPLANT
GOWN STRL REUS W/ TWL LRG LVL3 (GOWN DISPOSABLE) ×2 IMPLANT
GOWN STRL REUS W/ TWL XL LVL3 (GOWN DISPOSABLE) ×1 IMPLANT
GOWN STRL REUS W/TWL LRG LVL3 (GOWN DISPOSABLE) ×4
GOWN STRL REUS W/TWL XL LVL3 (GOWN DISPOSABLE) ×2
LIQUID BAND (GAUZE/BANDAGES/DRESSINGS) ×3 IMPLANT
NS IRRIG 1000ML POUR BTL (IV SOLUTION) ×3 IMPLANT
PACK C SECTION WH (CUSTOM PROCEDURE TRAY) ×3 IMPLANT
PAD ABD 7.5X8 STRL (GAUZE/BANDAGES/DRESSINGS) ×3 IMPLANT
PAD ABD DERMACEA PRESS 5X9 (GAUZE/BANDAGES/DRESSINGS) ×3 IMPLANT
PAD OB MATERNITY 4.3X12.25 (PERSONAL CARE ITEMS) ×3 IMPLANT
PAD PREP 24X48 CUFFED NSTRL (MISCELLANEOUS) ×3 IMPLANT
RTRCTR C-SECT PINK 25CM LRG (MISCELLANEOUS) ×3 IMPLANT
SUT MON AB 4-0 PS1 27 (SUTURE) ×3 IMPLANT
SUT MON AB-0 CT1 36 (SUTURE) ×6 IMPLANT
SUT PLAIN 0 NONE (SUTURE) IMPLANT
SUT PLAIN 2 0 (SUTURE) ×2
SUT PLAIN ABS 2-0 CT1 27XMFL (SUTURE) ×1 IMPLANT
SUT PROLENE 1 CT (SUTURE) ×3 IMPLANT
SUT VIC AB 0 CT1 36 (SUTURE) ×6 IMPLANT
SUT VIC AB 2-0 CT1 (SUTURE) ×12 IMPLANT
SUT VIC AB 3-0 CT1 27 (SUTURE) ×2
SUT VIC AB 3-0 CT1 TAPERPNT 27 (SUTURE) ×1 IMPLANT
SUT VICRYL 2 0 18  TIES (SUTURE)
SUT VICRYL 2 0 18 TIES (SUTURE) IMPLANT
TOWEL OR 17X24 6PK STRL BLUE (TOWEL DISPOSABLE) ×6 IMPLANT

## 2016-09-05 NOTE — H&P (Signed)
Obstetrics Admission History & Physical  09/05/2016 - 11:04 AM [facility]  Chief Complaint: here for scheduled repeat c-section and BTL  History of Present Illness  26 y.o. [id number] @ 7275w2d, with the above CC. Pregnancy complicated by: h/o prior c-section x 3, ?h/o seizures, h/o depression and pre-eclampsia, h/o asthma (on meds), bipolar (on meds).  Ms. Kim CooleyJessica R [name] states that she is doing well, NPO since MN and no s/s of labor or decreased FM  Review of Systems: her 12 point review of systems is negative or as noted in the History of Present Illness.  PMHx:  Past Medical History:  Diagnosis Date  . Anemia   . Anxiety   . Asthma   . Chest pain   . Depression   . Fibromyalgia   . Gestational diabetes 2010, 2012  . Heartburn during pregnancy   . Rectal bleeding   . Seizures (HCC) 2015   x 1- unknown reason- passed out  . Ulcer    PSHx:  Past Surgical History:  Procedure Laterality Date  . CESAREAN SECTION     x 2  . CESAREAN SECTION WITH BILATERAL TUBAL LIGATION Bilateral 07/03/2015   Procedure: REPEAT CESAREAN SECTION ;  Surgeon: Tereso NewcomerUgonna A Anyanwu, MD;  Location: [facility];  Service: Obstetrics;  Laterality: Bilateral;  . WISDOM TOOTH EXTRACTION     Medications:  Prescriptions Prior to Admission  Medication Sig Dispense Refill Last Dose  . acetaminophen (TYLENOL) 500 MG tablet Take 1,000 mg by mouth every 6 (six) hours as needed for mild pain or headache.   Past Week at Unknown time  . ALPRAZolam (XANAX) 0.5 MG tablet Take 1 tablet (0.5 mg total) by mouth at bedtime as needed for anxiety. Take once a day by mouth as needed 30 tablet 3 09/04/2016 at Unknown time  . aspirin 81 MG chewable tablet Chew 1 tablet (81 mg total) by mouth daily. 90 tablet 3 09/04/2016 at Unknown time  . calcium carbonate (TUMS - DOSED IN MG ELEMENTAL CALCIUM) 500 MG chewable tablet Chew 2 tablets by mouth 3 (three) times daily as needed for indigestion or heartburn.   09/04/2016 at Unknown  time  . diphenhydrAMINE-zinc acetate (BENADRYL) cream Apply 1 application topically daily as needed for itching.   Past Week at Unknown time  . meclizine (ANTIVERT) 12.5 MG tablet Take 1 tablet (12.5 mg total) by mouth 3 (three) times daily as needed for dizziness. 30 tablet 0 Past Week at Unknown time  . Prenatal Vit-Fe Fumarate-FA (PRENATAL VITAMIN) 27-0.8 MG TABS Take 1 tablet by mouth every morning. (Patient taking differently: Take 1 tablet by mouth 2 (two) times daily. ) 30 tablet 0 09/04/2016 at Unknown time  . albuterol (PROVENTIL HFA;VENTOLIN HFA) 108 (90 BASE) MCG/ACT inhaler Inhale 2 puffs into the lungs every 6 (six) hours as needed for wheezing or shortness of breath. (Patient not taking: Reported on 08/30/2016) 18 g 5 Not Taking  . beclomethasone (QVAR) 40 MCG/ACT inhaler Inhale 2 puffs into the lungs 2 (two) times daily. (Patient not taking: Reported on 08/30/2016) 1 Inhaler 12 More than a month at Unknown time     Allergies: has No Known Allergies. OBHx:  OB History  Gravida Para Term Preterm AB Living  5 3 3   1 3   SAB TAB Ectopic Multiple Live Births  1     0 3    # Outcome Date GA Lbr Len/2nd Weight Sex Delivery Anes PTL Lv  5 Current  4 Term 07/03/15 [redacted]w[redacted]d  3.118 kg (6 lb 14 oz) M CS-LTranv Spinal  LIV     Birth Comments: Hgb, Normal, FA Newborn Screen Barcode: [id number] Date Collected: 07/04/2015  3 Term 02/04/11    F CS-LTranv   LIV     Complications: Pre-eclampsia  2 Term 01/12/09    M CS-LTranv EPI N LIV     Complications: Fetal Intolerance  1 SAB                        FHx:  Family History  Problem Relation Age of Onset  . Depression Mother   . Fibromyalgia Mother   . Cancer Mother     breast  . Diabetes Maternal Grandmother   . Hypertension Maternal Grandmother   . Arthritis Maternal Grandmother   . Thyroid disease Maternal Grandmother    Soc Hx:  Social History   Social History  . Marital status: Divorced    Spouse name: N/A  . Number of  children: N/A  . Years of education: N/A   Occupational History  . Not on file.   Social History Main Topics  . Smoking status: Current Some Day Smoker    Packs/day: 0.50    Types: Cigarettes  . Smokeless tobacco: Former Neurosurgeon    Quit date: 08/24/2014  . Alcohol use No  . Drug use:     Types: Marijuana     Comment: not while pregnant  . Sexual activity: Yes    Birth control/ protection: None   Other Topics Concern  . Not on file   Social History Narrative  . No narrative on file    Objective    Current Vital Signs 24h Vital Sign Ranges  T 98.1 F (36.7 C) Temp  Avg: 98.1 F (36.7 C)  Min: 98.1 F (36.7 C)  Max: 98.1 F (36.7 C)  BP 111/70 BP  Min: 111/70  Max: 111/70  HR 89 Pulse  Avg: 89  Min: 89  Max: 89  RR 16 Resp  Avg: 16  Min: 16  Max: 16  SaO2 100 % Not Delivered SpO2  Avg: 100 %  Min: 100 %  Max: 100 %       24 Hour I/O Current Shift I/O  Time Ins Outs No intake/output data recorded. No intake/output data recorded.   Dopplers: normal in pre op  General: Well nourished, well developed female in no acute distress.  Skin:  Warm and dry.  Cardiovascular: S1, S2 normal, no murmur, rub or gallop, regular rate and rhythm Respiratory:  Clear to auscultation bilateral. Normal respiratory effort Abdomen: gravid, nttp. Well healed (hard to even see) low transverse scar Neuro/Psych:  Normal mood and affect.   Labs  O POS  Recent Labs Lab 09/04/16 0950  WBC 9.9  HGB 10.0*  HCT 30.0*  PLT 231   RPR: NR  Radiology Posterior placenta  Perinatal info   ABO, Rh: --/--/O POS (09/05 4098) Antibody: NEG (09/05 0950) Rubella: 4.20 (02/08 1027) RPR: Non Reactive (09/05 0950)  HBsAg: NEGATIVE (02/08 1027)  HIV: NONREACTIVE (06/28 1032)  GBS: +urine  Assessment & Plan   26 y.o. [id number] @ [redacted]w[redacted]d and doing well Pt okay for c-section. R/b/a d/w pt and she desires BTL. Papers UTD D/w risk for adhesive disease, even though prior op note states minimal. Can  proceed when OR is ready  Cornelia Copa MD Attending Center for [facility])

## 2016-09-05 NOTE — Transfer of Care (Signed)
Immediate Anesthesia Transfer of Care Note  Patient: Geanie CooleyJessica R Dagostino  Procedure(s) Performed: Procedure(s): REPEAT CESAREAN SECTION WITH BILATERAL TUBAL LIGATION (Bilateral)  Patient Location: PACU  Anesthesia Type:Spinal  Level of Consciousness: awake, alert  and oriented  Airway & Oxygen Therapy: Patient Spontanous Breathing  Post-op Assessment: Report given to RN and Post -op Vital signs reviewed and stable  Post vital signs: Reviewed and stable  Last Vitals:  Vitals:   09/05/16 1040  BP: 111/70  Pulse: 89  Resp: 16  Temp: 36.7 C    Last Pain:  Vitals:   09/05/16 1040  TempSrc: Oral      Patients Stated Pain Goal: 3 (09/05/16 1040)  Complications: No apparent anesthesia complications

## 2016-09-05 NOTE — Anesthesia Procedure Notes (Signed)
Spinal Patient location during procedure: OR Preanesthetic Checklist Completed: patient identified, site marked, surgical consent, pre-op evaluation, timeout performed, IV checked, risks and benefits discussed and monitors and equipment checked Spinal Block Patient position: sitting Prep: DuraPrep Patient monitoring: heart rate, cardiac monitor, continuous pulse ox and blood pressure Approach: midline Location: L3-4 Injection technique: single-shot Needle Needle type: Sprotte  Needle gauge: 24 G Needle length: 9 cm Assessment Sensory level: T4 Additional Notes Spinal Dosage in OR  Bupivicaine ml       1.8 PFMS04   mcg        100 Fentanyl mcg            25    

## 2016-09-05 NOTE — Anesthesia Preprocedure Evaluation (Signed)
Anesthesia Evaluation  Patient identified by MRN, date of birth, ID band Patient awake    Reviewed: Allergy & Precautions, H&P , Patient's Chart, lab work & pertinent test results  Airway Mallampati: II  TM Distance: >3 FB Neck ROM: full    Dental no notable dental hx.    Pulmonary asthma , Current Smoker,    Pulmonary exam normal breath sounds clear to auscultation       Cardiovascular Exercise Tolerance: Good  Rhythm:regular Rate:Normal     Neuro/Psych    GI/Hepatic   Endo/Other  diabetes  Renal/GU      Musculoskeletal   Abdominal   Peds  Hematology   Anesthesia Other Findings   Reproductive/Obstetrics                             Anesthesia Physical Anesthesia Plan  ASA: II  Anesthesia Plan: Spinal   Post-op Pain Management:    Induction:   Airway Management Planned:   Additional Equipment:   Intra-op Plan:   Post-operative Plan:   Informed Consent: I have reviewed the patients History and Physical, chart, labs and discussed the procedure including the risks, benefits and alternatives for the proposed anesthesia with the patient or authorized representative who has indicated his/her understanding and acceptance.   Dental Advisory Given  Plan Discussed with: CRNA  Anesthesia Plan Comments: (Lab work confirmed with CRNA in room. Platelets okay. Discussed spinal anesthetic, and patient consents to the procedure:  included risk of possible headache,backache, failed block, allergic reaction, and nerve injury. This patient was asked if she had any questions or concerns before the procedure started. )        Anesthesia Quick Evaluation

## 2016-09-05 NOTE — Op Note (Signed)
Operative Note   SURGERY DATE: 09/05/2016  PRE-OP DIAGNOSIS:  Intrauterine pregnancy @ [redacted]w[redacted]d, undesired fertility  POST-OP DIAGNOSIS: Same  PROCEDURE: Repeat low transverse cesarean section via pfannenstiel skin incision with double layer uterine closure and bilateral tubal ligation  SURGEON: Surgeon(s) and Role:    * Pushmataha Bing, MD - Primary    * Michaele Offer, DO - Assisting  ASSISTANT: Jen Mow, DO  ANESTHESIA: spinal  ESTIMATED BLOOD LOSS: 500 mL  DRAINS: UOP via indwelling foley  TOTAL IV FLUIDS: crystalloid  VTE Prophylaxis: SCDs  ANTIBIOTICS: Two grams of Cefazolin were given., within 1 hour of skin incision  SPECIMENS: Right and left fallopian tube sections  COMPLICATIONS: None  INDICATIONS: Repeat cesarean and undesired fertility  FINDINGS: No intra-abdominal adhesions were noted. Grossly normal uterus with a 3cm round midline window to uterus, tubes and ovaries. clear amniotic fluid, cephalic female infant, weight 3125 gm, APGARs 8/9, intact placenta.   PROCEDURE IN DETAIL: The patient was taken to the operating room where anesthesia was administered and normal fetal heart tones were confirmed. She was then prepped and draped in the normal fashion in the dorsal supine position with a leftward tilt.  After a time out was performed, a pfannensteil skin incision was made with the scalpel and carried through to the underlying layer of fascia. The fascia was then incised at the midline and this incision was extended laterally with the mayo scissors. Attention was turned to the superior aspect of the fascial incision which was grasped with the kocher clamps x 2, tented up and the rectus muscles were dissected off. In a similar fashion the inferior aspect of the fascial incision was grasped with the kocher clamps, tented up and the rectus muscles dissected off with the mayo scissors. The rectus muscles were then separated in the midline and  the peritoneum was entered bluntly. Alexis retractor was inserted.  A low transverse hysterotomy was made with the scalpel until the endometrial cavity was breached and the amniotic sac ruptured upon entry, yielding clear amniotic fluid. This incision was extended bluntly and the infant's head, shoulders and body were delivered atraumatically.The cord was clamped x 2 and cut, and the infant was handed to the awaiting pediatricians, after delayed cord clamping was done for 60 seconds.  The placenta was then gradually expressed from the uterus and then the uterus was exteriorized and cleared of all clots and debris. The hysterotomy was repaired with a running suture of 1-0 Vicryl. A second imbricating layer of 1-0 Vicryl suture was then placed.    The left Fallopian tube was identified by tracing out to the fimbraie, grasped with the Babcock clamps. An avascular midsection of the tube approximately 3-4cm from the cornua was grasped with the babcock clamps and the distal and proximal aspects were ligated with a suture of 0 Vicryl, with the intervening portion of tube was transected and removed, via the Metzenbaum scissors.  Attention was then turned to the right fallopian tube after confirmation by tracing the tube out to the fimbriae. The same procedure was then performed on the right Fallopian tube, with excellent hemostasis was noted from both BTL sites.  The uterus and adnexa were then returned to the abdomen, and the hysterotomy and all operative sites were reinspected and excellent hemostasis was noted after irrigation and suction of the abdomen with warm saline.  The peritoneum was closed with a running stitch of 1-0 VIcryl. The fascia was reapproximated with 0 Monocryl in a simple  running fashion. The subcutaneous layer was then reapproximated with interrupted sutures of 2-0 plain gut, and the skin was then closed with 4-0 monocryl, in a subcuticular fashion.  The patient  tolerated the procedure  well. Sponge, lap, needle, and instrument counts were correct x 2. The patient was transferred to the recovery room awake, alert and breathing independently in stable condition.    Cleda ClarksElizabeth W Mumaw, DO OB Fellow Center for [facility] Midwife(Faculty Practice)

## 2016-09-06 LAB — CBC
HEMATOCRIT: 24.9 % — AB (ref 36.0–46.0)
Hemoglobin: 8.4 g/dL — ABNORMAL LOW (ref 12.0–15.0)
MCH: 29.4 pg (ref 26.0–34.0)
MCHC: 33.7 g/dL (ref 30.0–36.0)
MCV: 87.1 fL (ref 78.0–100.0)
Platelets: 184 10*3/uL (ref 150–400)
RBC: 2.86 MIL/uL — ABNORMAL LOW (ref 3.87–5.11)
RDW: 13.4 % (ref 11.5–15.5)
WBC: 13.1 10*3/uL — AB (ref 4.0–10.5)

## 2016-09-06 MED ORDER — OXYCODONE HCL 5 MG PO TABS
10.0000 mg | ORAL_TABLET | ORAL | Status: DC | PRN
  Administered 2016-09-06 – 2016-09-07 (×7): 10 mg via ORAL
  Filled 2016-09-06 (×7): qty 2

## 2016-09-06 MED ORDER — OXYCODONE HCL 5 MG PO TABS
5.0000 mg | ORAL_TABLET | ORAL | Status: DC | PRN
  Administered 2016-09-06 – 2016-09-08 (×4): 5 mg via ORAL
  Filled 2016-09-06 (×4): qty 1

## 2016-09-06 NOTE — Progress Notes (Signed)
CLINICAL SOCIAL WORK MATERNAL/CHILD NOTE  Patient Details  Name: Kim [name] MRN: [id number] Date of Birth: 09/05/2018  Date:  09/06/2016  Clinical Social Worker Initiating Note:   N , LCSW            Date/ Time Initiated:  09/06/16/1140                        Child's Name:  Kim [name]   Legal Guardian:  Mother   Need for Interpreter:  None   Date of Referral:        Reason for Referral:  Other (Comment) (hx of bipolar, currently on medication)   Referral Source:  RN   Address:     Phone number:      Household Members: Minor Children, Spouse   Natural Supports (not living in the home): Extended Family, Friends, Immediate Family, Parent   Professional Supports:None   Employment:Homemaker   Type of Work: husband works full time, [name] stays home with children   Education:  High school graduate   Financial Resources:Medicaid   Other Resources: WIC, Food Stamps    Cultural/Religious Considerations Which May Impact Care: none reported  Strengths: Ability to meet basic needs , Compliance with medical plan , Home prepared for child , Pediatrician chosen    Risk Factors/Current Problems: Mental Health Concerns    Cognitive State: Able to Concentrate , Alert , Insightful , Linear Thinking    Mood/Affect: Calm , Comfortable    CSW Assessment:LCSW received consult for hx of Bipolar Dx.  Chart reviewed and notes from previous pregnancy reviewed in 2016.  LCSW met with [name] and FOB at the bedside to complete assessment. [name] reports this has by far been the easiest baby she has had with regards to labor and delivery and and baby's disposition currently.  [name] reports mood has bene stable throughout pregnancy with limited medication intervention opposed to her last pregnancy in 2016. She contributes her added stress due to relationship dynamics and housing situation. Since last pregnancy she has moved and feels more safe and  comfortable in new home.  Relationship has improved and family dynamics have decreased.  She reports she still takes her Xanax as prescribed and managing well with only that medication. She denies need to resume any other medication or problems at this time. She is aware to follow up with OB if emotional state changes or experiences any PPD/anxiety/mood instability.  FOB in room during assessment, very supportive of mother and assistance. Reports he goes back to work and she will stay home with children.  At discharge she will stay with her mother and newborn to adjust as she has three other children and will need additional support as husband returns to work.  She denies any other needs at this time.  Will hopefully discharge tomorrow she reports. She reports she has all necessary means for baby at home.  CSW Plan/Description: Information/Referral to Community Resources , Patient/Family Education , No Further Intervention Required/No Barriers to Discharge    ,  N, LCSW 09/06/2016, 11:41 AM  

## 2016-09-06 NOTE — Anesthesia Postprocedure Evaluation (Signed)
Anesthesia Post Note  Patient: Kim CooleyJessica R [name]  Procedure(s) Performed: Procedure(s) (LRB): REPEAT CESAREAN SECTION WITH BILATERAL TUBAL LIGATION (Bilateral)  Patient location during evaluation: Mother Baby Anesthesia Type: Spinal Level of consciousness: awake, awake and alert, oriented and patient cooperative Pain management: pain level controlled Vital Signs Assessment: post-procedure vital signs reviewed and stable Respiratory status: spontaneous breathing, nonlabored ventilation and respiratory function stable Cardiovascular status: stable Postop Assessment: patient able to bend at knees, no headache, no backache and no signs of nausea or vomiting Anesthetic complications: no     Last Vitals:  Vitals:   09/05/16 2330 09/06/16 0400  BP: 124/67 (!) 116/58  Pulse: 69 72  Resp: 18 18  Temp: 36.7 C 36.7 C    Last Pain:  Vitals:   09/06/16 0400  TempSrc:   PainSc: 4    Pain Goal: Patients Stated Pain Goal: 3 (09/06/16 0400)               Edwin Baines L

## 2016-09-06 NOTE — Progress Notes (Signed)
Subjective: Postpartum Day 1: Cesarean Delivery Patient reports tolerating PO, some soreness around the incision site. She had her foley removed this morning and has not yet voided or passed gas. She has been able to get up and walk around, she had two large meals yesterday post op.   Objective: Vital signs in last 24 hours: Temp:  [97.5 F (36.4 C)-98.2 F (36.8 C)] 98 F (36.7 C) (09/07 0400) Pulse Rate:  [52-89] 72 (09/07 0400) Resp:  [14-24] 18 (09/07 0400) BP: (102-124)/(56-70) 116/58 (09/07 0400) SpO2:  [97 %-100 %] 100 % (09/07 0400)  Physical Exam:  General: alert, cooperative and no distress  Lungs: lungs clear to auscultation, no wheezes. Lochia: appropriate Uterine Fundus: firm Incision: no significant erythema DVT Evaluation: No evidence of DVT seen on physical exam.   Recent Labs  09/04/16 0950 09/06/16 0526  HGB 10.0* 8.4*  HCT 30.0* 24.9*    Assessment/Plan: Status post Cesarean section. Doing well postoperatively. [place] [facility] [place] for additional support with baby. Briefly discussed patient's smoking and the risks it can pose to baby. Continue current care.  Hal MoralesSharon Eshet 09/06/2016, 7:27 AM  CNM attestation Post Partum Day #1  Kim CooleyJessica R [name] is a 26 y.o. [id number] s/p rLTCS.  Pt denies problems with ambulating, voiding or po intake. Pain is well controlled.  Plan for birth control is bilateral tubal ligation.  Method of Feeding: bottle  PE:  BP (!) 111/55 (BP Location: Right Arm)   Pulse 73   Temp 98.1 F (36.7 C) (Oral)   Resp 20   LMP 12/04/2015 (LMP Unknown)   SpO2 99%   Breastfeeding? Unknown  Fundus firm  Plan for discharge: 09/07/16  Cam HaiSHAW, Koty Anctil, CNM 9:32 AM  09/06/2016

## 2016-09-07 ENCOUNTER — Inpatient Hospital Stay (HOSPITAL_COMMUNITY): Payer: Medicaid Other

## 2016-09-07 LAB — CBC WITH DIFFERENTIAL/PLATELET
BASOS PCT: 0 %
Basophils Absolute: 0 10*3/uL (ref 0.0–0.1)
EOS ABS: 0.2 10*3/uL (ref 0.0–0.7)
EOS PCT: 2 %
HCT: 30.6 % — ABNORMAL LOW (ref 36.0–46.0)
HEMOGLOBIN: 10.2 g/dL — AB (ref 12.0–15.0)
Lymphocytes Relative: 21 %
Lymphs Abs: 2.3 10*3/uL (ref 0.7–4.0)
MCH: 29.3 pg (ref 26.0–34.0)
MCHC: 33.3 g/dL (ref 30.0–36.0)
MCV: 87.9 fL (ref 78.0–100.0)
MONOS PCT: 5 %
Monocytes Absolute: 0.5 10*3/uL (ref 0.1–1.0)
NEUTROS PCT: 72 %
Neutro Abs: 8 10*3/uL — ABNORMAL HIGH (ref 1.7–7.7)
PLATELETS: 224 10*3/uL (ref 150–400)
RBC: 3.48 MIL/uL — ABNORMAL LOW (ref 3.87–5.11)
RDW: 14 % (ref 11.5–15.5)
WBC: 11 10*3/uL — AB (ref 4.0–10.5)

## 2016-09-07 LAB — BASIC METABOLIC PANEL
Anion gap: 7 (ref 5–15)
BUN: 9 mg/dL (ref 6–20)
CHLORIDE: 102 mmol/L (ref 101–111)
CO2: 25 mmol/L (ref 22–32)
CREATININE: 0.56 mg/dL (ref 0.44–1.00)
Calcium: 8.2 mg/dL — ABNORMAL LOW (ref 8.9–10.3)
GFR calc non Af Amer: >60 mL/min (ref >60)
GLUCOSE: 82 mg/dL (ref 65–99)
Potassium: 3.5 mmol/L (ref 3.5–5.1)
SODIUM: 134 mmol/L — AB (ref 135–145)

## 2016-09-07 LAB — MAGNESIUM: Magnesium: 1.6 mg/dL — ABNORMAL LOW (ref 1.7–2.4)

## 2016-09-07 MED ORDER — SODIUM CHLORIDE 0.9 % IV SOLN
INTRAVENOUS | Status: DC
Start: 2016-09-07 — End: 2016-09-08
  Administered 2016-09-07: 22:00:00 via INTRAVENOUS

## 2016-09-07 MED ORDER — ONDANSETRON 4 MG PO TBDP
4.0000 mg | ORAL_TABLET | Freq: Four times a day (QID) | ORAL | Status: DC | PRN
  Administered 2016-09-07: 4 mg via ORAL
  Filled 2016-09-07 (×2): qty 1

## 2016-09-07 MED ORDER — FLEET ENEMA 7-19 GM/118ML RE ENEM
1.0000 | ENEMA | Freq: Every day | RECTAL | Status: DC
  Administered 2016-09-07: 1 via RECTAL

## 2016-09-07 MED ORDER — HYDROMORPHONE HCL 1 MG/ML IJ SOLN
1.0000 mg | INTRAMUSCULAR | Status: DC | PRN
  Administered 2016-09-07 – 2016-09-08 (×3): 1 mg via INTRAVENOUS
  Filled 2016-09-07 (×3): qty 1

## 2016-09-07 MED ORDER — KETOROLAC TROMETHAMINE 30 MG/ML IJ SOLN
30.0000 mg | Freq: Four times a day (QID) | INTRAMUSCULAR | Status: DC
  Administered 2016-09-07 – 2016-09-08 (×2): 30 mg via INTRAVENOUS
  Filled 2016-09-07 (×2): qty 1

## 2016-09-07 MED ORDER — HYDROMORPHONE HCL 1 MG/ML IJ SOLN
1.0000 mg | Freq: Once | INTRAMUSCULAR | Status: AC
  Administered 2016-09-07: 1 mg via INTRAMUSCULAR
  Filled 2016-09-07: qty 1

## 2016-09-07 MED ORDER — BISACODYL 10 MG RE SUPP
10.0000 mg | Freq: Every day | RECTAL | Status: DC | PRN
  Administered 2016-09-07 (×2): 10 mg via RECTAL
  Filled 2016-09-07 (×2): qty 1

## 2016-09-07 NOTE — Progress Notes (Signed)
Subjective: pt with ileus, with transverse colon distention dominant Postpartum Day 2: Cesarean Delivery Patient reports nausea and incisional pain.  Abdomen significantly distended no guarding  Objective: Vital signs in last 24 hours: Temp:  [97.9 F (36.6 C)] 97.9 F (36.6 C) (09/08 0622) Pulse Rate:  [52-53] 52 (09/08 1828) Resp:  [18-19] 18 (09/08 1828) BP: (103-113)/(45-62) 103/45 (09/08 1828)  Physical Exam:  General: alert, mild distress and moderate distress abdomen soft but distended, hypoac Lochia: appropriate Uterine Fundus: firm Incision: healing well DVT Evaluation: No evidence of DVT seen on physical exam. CBC Latest Ref Rng & Units 09/06/2016 09/04/2016 08/23/2016  WBC 4.0 - 10.5 K/uL 13.1(H) 9.9 11.1(H)  Hemoglobin 12.0 - 15.0 g/dL 1.6(X8.4(L) 10.0(L) 9.6(L)  Hematocrit 36.0 - 46.0 % 24.9(L) 30.0(L) 28.4(L)  Platelets 150 - 400 K/uL 184 231 224     Recent Labs  09/06/16 0526  HGB 8.4*  HCT 24.9*    Assessment/Plan: Status post Cesarean section. Postoperative course complicated by colonic ileus, and mild dehydration.  Plan  1. retart IV 2. Focus on evacuation of sigmoid colon-- enemas fleets tonight and am 3. IV toradol and backup IV dilaudid prn severe pain. 4 check cbc bmet, mag level tonight.Clearance C[place]s. Sips and chips.  Rhyan Wolters V 09/07/2016, 9:06 PM

## 2016-09-07 NOTE — Progress Notes (Signed)
POSTPARTUM PROGRESS NOTE  Post Partum Day 2, s/p RLTCS with BTL  Subjective:  Kim [name] is a 26 y.o. [id number] 8362w2d s/p RTLCS. Having diffuse abdominal pain since last night, and had emesis x 3. No BM since procedure. Received enema last night, and passed flatus then. Has voided. Had dilaudid for pain this morning, with some improvement of pain, but still reporting significant pain.   Objective: Blood pressure 113/62, pulse (!) 53, temperature 97.9 F (36.6 C), resp. rate 19, last menstrual period 12/04/2015, SpO2 99 %, unknown if currently breastfeeding.  Physical Exam:  General: alert, cooperative Lochia: normal flow Chest: no respiratory distress Heart:regular rate, distal pulses intact Abdomen: soft, moderately distended, +tympanic, +TTP throughout. No rebound  Uterine Fundus: not palpated d/t patient's discomfort. Incision: clean, dry, intact with dressing in place DVT Evaluation: No calf swelling or tenderness Extremities: no edema   Recent Labs  09/06/16 0526  HGB 8.4*  HCT 24.9*    Imaging:  ABDOMEN - 2 VIEW IMPRESSION: Minimal free air likely related to the patient's recent Cesarean section.  Changes most likely related to diffuse colonic ileus. Correlation with the physical exam is recommended.   Assessment/Plan:  Kim CooleyJessica R Kluger is a 26 y.o. [id number] 5562w2d s/p RLTCS with BTL. Now with ileus.  - NPO. OOB, encourage ambulation - Continue to monitor   LOS: 2 days   Kandra NicolasJulie P DegeleMD 09/07/2016, 10:26 AM    OB FELLOW POSTPARTUM PROGRESS NOTE ATTESTATION  I have seen and examined this patient and agree with above documentation in the resident's note.   Ernestina PennaNicholas Schenk, MD 1:40 PM

## 2016-09-08 MED ORDER — OXYCODONE HCL 5 MG PO TABS
5.0000 mg | ORAL_TABLET | ORAL | Status: DC | PRN
  Administered 2016-09-08 – 2016-09-10 (×8): 10 mg via ORAL
  Filled 2016-09-08 (×8): qty 2

## 2016-09-08 MED ORDER — IBUPROFEN 600 MG PO TABS
600.0000 mg | ORAL_TABLET | Freq: Four times a day (QID) | ORAL | Status: DC | PRN
  Administered 2016-09-10: 600 mg via ORAL
  Filled 2016-09-08: qty 1

## 2016-09-08 MED ORDER — MAGNESIUM SULFATE 2 GM/50ML IV SOLN
2.0000 g | Freq: Once | INTRAVENOUS | Status: AC
  Filled 2016-09-08: qty 50

## 2016-09-08 MED ORDER — KETOROLAC TROMETHAMINE 30 MG/ML IJ SOLN
15.0000 mg | Freq: Four times a day (QID) | INTRAMUSCULAR | Status: AC
  Administered 2016-09-08 – 2016-09-09 (×2): 15 mg via INTRAVENOUS
  Filled 2016-09-08 (×2): qty 1

## 2016-09-08 MED ORDER — POTASSIUM CHLORIDE CRYS ER 20 MEQ PO TBCR
20.0000 meq | EXTENDED_RELEASE_TABLET | Freq: Two times a day (BID) | ORAL | Status: DC
  Administered 2016-09-09 (×2): 20 meq via ORAL
  Filled 2016-09-08 (×4): qty 1

## 2016-09-08 MED ORDER — MAGNESIUM SULFATE 2 GM/50ML IV SOLN
2.0000 g | Freq: Once | INTRAVENOUS | Status: DC
  Administered 2016-09-08: 2 g via INTRAVENOUS
  Filled 2016-09-08: qty 50

## 2016-09-08 MED ORDER — HYDROMORPHONE HCL 1 MG/ML IJ SOLN
1.0000 mg | Freq: Once | INTRAMUSCULAR | Status: AC
Start: 2016-09-08 — End: 2016-09-08
  Administered 2016-09-08: 1 mg via INTRAVENOUS
  Filled 2016-09-08: qty 1

## 2016-09-08 NOTE — Progress Notes (Addendum)
OB Note Went to check up on patient to see how she's doing Feels much better than yesterday and compared to this AM No nausea or vomiting and taking clears w/o difficulty and +BMs and flatus today; no fevers, chills and +ambulation   Current Vital Signs 24h Vital Sign Ranges  T 98.7 F (37.1 C) Temp  Avg: 98.2 F (36.8 C)  Min: 97.4 F (36.3 C)  Max: 98.7 F (37.1 C)  BP (!) 114/59 BP  Min: 100/59  Max: 114/59  HR 63 Pulse  Avg: 56.5  Min: 49  Max: 63  RR 18 Resp  Avg: 17.3  Min: 16  Max: 18  SaO2 99 % Not Delivered No Data Recorded       24 Hour I/O Current Shift I/O  Time Ins Outs No intake/output data recorded. No intake/output data recorded.   NAD +BS, moderate distension with tympany, nttp. C/d/i dressing  Medications Current Facility-Administered Medications  Medication Dose Route Frequency Provider Last Rate Last Dose  . acetaminophen (TYLENOL) tablet 650 mg  650 mg Oral Q4H PRN [facility], DO   650 mg at 09/06/16 1526  . bisacodyl (DULCOLAX) suppository 10 mg  10 mg Rectal Daily PRN Jacklyn Shell, CNM   10 mg at 09/07/16 1339  . coconut oil  1 application Topical PRN Hiram Comber Mumaw, DO      . witch hazel-glycerin (TUCKS) pad 1 application  1 application Topical PRN [facility], DO       And  . dibucaine (NUPERCAINAL) 1 % rectal ointment 1 application  1 application Rectal PRN [facility] Mumaw, DO      . diphenhydrAMINE (BENADRYL) injection 12.5 mg  12.5 mg Intravenous Q4H PRN Cristela Blue, MD   12.5 mg at 09/07/16 2254   Or  . diphenhydrAMINE (BENADRYL) capsule 25 mg  25 mg Oral Q4H PRN Cristela Blue, MD   25 mg at 09/06/16 0844  . diphenhydrAMINE (BENADRYL) capsule 25 mg  25 mg Oral Q6H PRN [facility], DO      . ibuprofen (ADVIL,MOTRIN) tablet 600 mg  600 mg Oral Q6H [facility], DO   600 mg at 09/08/16 1711  . ketorolac (TORADOL) 30 MG/ML injection 30 mg  30 mg Intravenous Q6H Tilda Burrow, MD   30 mg at 09/08/16 0610  . menthol-cetylpyridinium (CEPACOL) lozenge 3 mg  1 lozenge Oral Q2H PRN [facility], DO      . nalbuphine (NUBAIN) injection 5 mg  5 mg Intravenous Q4H PRN Cristela Blue, MD       Or  . nalbuphine (NUBAIN) injection 5 mg  5 mg Subcutaneous Q4H PRN Cristela Blue, MD      . nalbuphine (NUBAIN) injection 5 mg  5 mg Intravenous Once PRN Cristela Blue, MD       Or  . nalbuphine (NUBAIN) injection 5 mg  5 mg Subcutaneous Once PRN Cristela Blue, MD      . naloxone [facility]) 2 mg in dextrose 5 % 250 mL infusion  1-4 mcg/kg/hr Intravenous Continuous PRN Cristela Blue, MD      . naloxone [facility]) injection 0.4 mg  0.4 mg Intravenous PRN Cristela Blue, MD       And  . sodium chloride flush (NS) 0.9 % injection 3 mL  3 mL Intravenous PRN Cristela Blue, MD      . ondansetron [facility]) injection 4 mg  4 mg Intravenous Q8H PRN Cristela Blue, MD      .  ondansetron (ZOFRAN-ODT) disintegrating tablet 4 mg  4 mg Oral Q6H PRN Jacklyn ShellFrances Cresenzo-Dishmon, CNM   4 mg at 09/07/16 0647  . oxyCODONE (Oxy IR/ROXICODONE) immediate release tablet 5 mg  5 mg Oral Q4H PRN Leland HerElsia J Yoo, DO   5 mg at 09/08/16 1604  . potassium chloride SA (K-DUR,KLOR-CON) CR tablet 20 mEq  20 mEq Oral BID Lorne SkeensNicholas Michael Schenk, MD      . prenatal multivitamin tablet 1 tablet  1 tablet Oral Q1200 [facility] Mumaw, DO   1 tablet at 09/06/16 1110  . senna-docusate (Senokot-S) tablet 2 tablet  2 tablet Oral Q24H [facility] Callender Lake[place]um[place], DO   2 tablet at 09/06/16 2205  . simethicone (MYLICON) chewable tablet 80 mg  80 mg Oral TID PC [facility] Mumaw, DO   80 mg at 09/08/16 1711  . simethicone (MYLICON) chewable tablet 80 mg  80 mg Oral Q24H [facility] Mumaw, DO   80 mg at 09/06/16 2206  . simethicone (MYLICON) chewable tablet 80 mg  80 mg Oral PRN [facility] Mumaw, DO      . Tdap (BOOSTRIX) injection 0.5 mL  0.5 mL Intramuscular Once [facility] Mumaw, DO      .  zolpidem [facility](AMBIEN) tablet 5 mg  5 mg Oral QHS PRN [facility] Mumaw, DO   5 mg at 09/06/16 2206    Labs:   Recent Labs Lab 09/04/16 0950 09/06/16 0526 09/07/16 2142  WBC 9.9 13.1* 11.0*  HGB 10.0* 8.4* 10.2*  HCT 30.0* 24.9* 30.6*  PLT 231 184 224    Recent Labs Lab 09/07/16 2142  NA 134*  K 3.5  CL 102  CO2 25  BUN 9  CREATININE 0.56  GLUCOSE 82  CALCIUM 8.2*   A/p: pt improved with resolving colonic ileus Continue with current plan of care. Pt told to stay on clears until AM rounds and let us know if s/s worsen. Will do po oxycodone pain scale. rescan if s/s worsen. Will do two doses of toradol (next dose at [place]) x 2 doses and then can transition to motrin for better pain control.   Cornelia Copaharlie Loxley Cibrian, Jr MD Attending Center for [facility] Midwife(Faculty Practice)

## 2016-09-08 NOTE — Progress Notes (Signed)
POSTPARTUM PROGRESS NOTE  Post Operative Day 3 Subjective:  Kim [name] is a 26 y.o. [id number] 462w2d s/p RLTCS.  No acute events overnight. Pt has been struggling with Post operative ileus. She had restarted IVF last night and had enema. Patient had large Bm this morning with good relief. She remains slightly dilated but would like to eat something this AM. Pain is only a 3.   Objective: Blood pressure (!) 100/59, pulse (!) 49, temperature 98.4 F (36.9 C), temperature source Oral, resp. rate 16, last menstrual period 12/04/2015, SpO2 99 %, unknown if currently breastfeeding.  Physical Exam:  General: alert, cooperative and no distress Lochia:normal flow Chest: no respiratory distress Heart:regular rate, distal pulses intact Incision: c/d/i Abdomen: minimally distended and tympanic, mildly tender. Normoactive bowel sounds Uterine Fundus: firm, appropriately tender DVT Evaluation: No calf swelling or tenderness Extremities: no edema   Recent Labs  09/06/16 0526 09/07/16 2142  HGB 8.4* 10.2*  HCT 24.9* 30.6*    Assessment/Plan:  ASSESSMENT: Kim [name] is a 26 y.o. [id number] 5062w2d s/p RLTCS with post operative illeus.   D/C IVF Advance diet to clears today D/C iv pain control replete magnesium  Consider D/C tomorrow AM.   LOS: 3 days   Les Pouicholas SchenkMD 09/08/2016, 7:49 AM

## 2016-09-08 NOTE — Progress Notes (Signed)
Had large bm before midnight   Feels better

## 2016-09-08 NOTE — Progress Notes (Signed)
Called to floor to evalauate pt for continued abdominal pain and distention. Pt reports recent large BM. She reports after eating fried chicken she has had some increased pain and is frustrated with her progress.   Exam: Gen: thin NAD HEENT: MMM Abd: soft, distended minimally tender. Improved form AM.    A/P One time dose iv dilaudid so pt can ambulate Continue k supplementation Recheck mg and k in am If worsening consider repeat x-ray

## 2016-09-09 ENCOUNTER — Inpatient Hospital Stay (HOSPITAL_COMMUNITY): Payer: Medicaid Other

## 2016-09-09 LAB — BASIC METABOLIC PANEL
ANION GAP: 4 — AB (ref 5–15)
BUN: 8 mg/dL (ref 6–20)
CO2: 25 mmol/L (ref 22–32)
Calcium: 7.9 mg/dL — ABNORMAL LOW (ref 8.9–10.3)
Chloride: 107 mmol/L (ref 101–111)
Creatinine, Ser: 0.53 mg/dL (ref 0.44–1.00)
GFR calc Af Amer: >60 mL/min (ref >60)
GLUCOSE: 98 mg/dL (ref 65–99)
POTASSIUM: 3.5 mmol/L (ref 3.5–5.1)
Sodium: 136 mmol/L (ref 135–145)

## 2016-09-09 LAB — MAGNESIUM: MAGNESIUM: 1.7 mg/dL (ref 1.7–2.4)

## 2016-09-09 MED ORDER — IOPAMIDOL (ISOVUE-300) INJECTION 61%
100.0000 mL | Freq: Once | INTRAVENOUS | Status: AC | PRN
  Administered 2016-09-09: 100 mL via INTRAVENOUS

## 2016-09-09 NOTE — Progress Notes (Signed)
Patient ID: Kim [name], female   DOB: 1990-05-12, 26 y.o.   MRN: [id number]  POSTPARTUM PROGRESS NOTE  Post Op Day #4 Subjective:  Kim [name] is a 26 y.o. [id number] 6634w2d s/p RLTCS w/ BTL.  No acute events overnight.  Pt denies problems with ambulating, voiding or po intake.  She reports nausea or vomiting.  Pain is moderately controlled.  She has had flatus. She has had bowel movement.  Lochia Small. Patient was unable to tolerate a diet yesterday. Pain is still present along with distension over entire abdomen, distension not as bad as previous days.  Objective: Blood pressure (!) 109/56, pulse (!) 57, temperature 98.2 F (36.8 C), temperature source Oral, resp. rate 16, last menstrual period 12/04/2015, SpO2 99 %, unknown if currently breastfeeding.  Physical Exam:  General: alert, cooperative and no distress Lochia:normal flow Chest: CTAB Heart: RRR no m/r/g Abdomen: minimal BS, soft, diffusely tender, distended; incision clean/dry/intact Uterine Fundus: firm, below umbilicus DVT Evaluation: No calf swelling or tenderness Extremities: No edema   Recent Labs  09/07/16 2142  HGB 10.2*  HCT 30.6*    Assessment/Plan:  ASSESSMENT: Kim [name] is a 26 y.o. [id number] 6634w2d s/p RLTCS w/ BTL. Ileus persistent.  CT Abdomen/Pelvis with PO contrast Continue bowel regimen Diet: CLD, may advance to softs for lunch if tolerates Contraception BTL    LOS: 4 days   Jen MowElizabeth Mumaw, DO OB Fellow 09/09/2016, 8:44 AM

## 2016-09-09 NOTE — Lactation Note (Signed)
This note was copied from a baby's chart. Lactation Consultation Note  Patient Name: Kim [name] ZOXWR'UTamala Barioday's Date: 09/09/2016 Reason for consult: Initial assessment (Mom has been giving formula, but has decided to try to nurse.)  Baby 354 days old. Mom nursed second child, 28-year-old for 6 months. Mom originally stated that she wanted to give this baby formula. However, mom reports now that it was the FOB that was adamant that she not BF. Mom is now engorged and has decided that she would like to nurse. [name]'s mother diagnosed with breast cancer and mom states that she is aware that BF is protective. Attempted to latch baby to right breast breast. Mom has small breasts with well-everted nipples. However, mom is engorged and baby cannot latch well. Also, baby has been taking large amounts of formula by bottle and baby not willing to latch. Baby very fussy at breast. So, assisted mom to use hand pump and hand express. Mom easily able to express 55 ml of EBM which was given to the baby by bottle. Baby still fussy, so MGma gave 25 ml of formula as well. Baby satisfied and sleepy quietly after several burps.  Mom set up with DEBP and knows how to use. Plan is for mom to put baby to breast with cues. Enc mom to supplement as needed with EBM/formula. Enc mom to postpump followed by hand expression. Enc mom to offer lots of STS and discussed with mom that it may take some time and patience for the baby to nurse now that she is used to the ease and flow of the bottle. Referred mom to Baby and Me booklet for EBM storage guidelines. Mom given paperwork for 2-week DEBP rental as well. Mom aware of OP/BFSG and LC phone line assistance after D/C.    Maternal Data Has patient been taught Hand Expression?: Yes Does the patient have breastfeeding experience prior to this delivery?: Yes  Feeding Feeding Type: Breast Fed Nipple Type: Slow - flow Length of feed: 0 min  LATCH Score/Interventions Latch: Too sleepy or  reluctant, no latch achieved, no sucking elicited. (Too fussy/hungry to latch.) Intervention(s): Skin to skin  Audible Swallowing: None Intervention(s): Skin to skin;Hand expression  Type of Nipple: Everted at rest and after stimulation  Comfort (Breast/Nipple): Soft / non-tender     Hold (Positioning): Assistance needed to correctly position infant at breast and maintain latch. Intervention(s): Breastfeeding basics reviewed;Support Pillows;Skin to skin;Position options  LATCH Score: 5  Lactation Tools Discussed/Used WIC Program: Yes Pump Review: Setup, frequency, and cleaning;Milk Storage Initiated by:: JW Date initiated:: 09/09/16   Consult Status Consult Status: Follow-up Date: 09/10/16 Follow-up type: In-patient    Kim [name] 09/09/2016, 10:32 PM

## 2016-09-10 DIAGNOSIS — K567 Ileus, unspecified: Secondary | ICD-10-CM

## 2016-09-10 DIAGNOSIS — Z9851 Tubal ligation status: Secondary | ICD-10-CM

## 2016-09-10 DIAGNOSIS — K9189 Other postprocedural complications and disorders of digestive system: Secondary | ICD-10-CM

## 2016-09-10 HISTORY — DX: Ileus, unspecified: K56.7

## 2016-09-10 MED ORDER — IBUPROFEN 600 MG PO TABS: 600.0000 mg | ORAL_TABLET | Freq: Four times a day (QID) | ORAL | 0 refills | Status: DC | PRN

## 2016-09-10 MED ORDER — OXYCODONE HCL 5 MG PO TABS: 5.0000 mg | ORAL_TABLET | ORAL | 0 refills | Status: DC | PRN

## 2016-09-10 NOTE — Discharge Summary (Signed)
OB Discharge Summary     Patient Name: Kim CooleyJessica R [name] DOB: November 02, 1990 MRN: [id number]  Date of admission: 09/05/2016 Delivering MD: [place] BingPICKENS, CHARLIE   Date of discharge: 09/10/2016  Admitting diagnosis: PREVIOUS C SECTION DISRE STERILZATION  Intrauterine pregnancy: 5356w2d     Secondary diagnosis:  Active Problems:   Status post repeat low transverse cesarean section   Ileus, postoperative   Status post tubal ligation  Additional problems: Postop Ileus     Discharge diagnosis: Term Pregnancy Delivered                                                                                                Post partum procedures:none  Augmentation: none  Complications: postop ileus  Hospital course:  26 y.o. yo [id number] at 7256w2d was admitted in Latent Labor on 09/05/2016. The patient went for cesarean section due to Prior Uterine Surgery (h/o previous C/S) and delivered a Viable infant, 09/05/2016. She also underwent bilateral tubal ligation Details of operation can be found in separate operative note. Patient had a postpartum course complicated by posoperative ileus. She was made NPO and ambulation encouraged. Her diet was then advanced slowly. On the morning of dicharge, she tolerated a regular diet.  She is ambulating, passing flatus, and urinating well.  Patient is discharged home in stable condition 09/10/16.   Physical exam Vitals:   09/08/16 1816 09/09/16 0612 09/09/16 1840 09/10/16 0553  BP: (!) 114/59 (!) 109/56 123/71 120/71  Pulse: 63 (!) 57 75 62  Resp: 18 16 18 20   Temp: 98.7 F (37.1 C) 98.2 F (36.8 C) 98.2 F (36.8 C) 98.3 F (36.8 C)  TempSrc: Oral Oral Oral   SpO2:   100%    General: alert, cooperative and no distress Lochia: appropriate Abdomen: soft, nontender, distention significantly improved Uterine Fundus: firm Incision: Healing well with no significant drainage, No significant erythema, Dressing is clean, dry, and intact DVT Evaluation: No evidence of DVT  seen on physical exam. Negative Homan's sign. No cords or calf tenderness. Labs: Lab Results  Component Value Date   WBC 11.0 (H) 09/07/2016   HGB 10.2 (L) 09/07/2016   HCT 30.6 (L) 09/07/2016   MCV 87.9 09/07/2016   PLT 224 09/07/2016   CMP Latest Ref Rng & Units 09/09/2016  Glucose 65 - 99 mg/dL 98  BUN 6 - 20 mg/dL 8  Creatinine 1.470.44 - 8.291.00 mg/dL 5.620.53  Sodium 130135 - 865145 mmol/L 136  Potassium 3.5 - 5.1 mmol/L 3.5  Chloride 101 - 111 mmol/L 107  CO2 22 - 32 mmol/L 25  Calcium 8.9 - 10.3 mg/dL 7.9(L)  Total Protein 6.1 - 8.1 g/dL -  Total Bilirubin 0.2 - 1.2 mg/dL -  Alkaline Phos 33 - 784115 U/L -  AST 10 - 30 U/L -  ALT 6 - 29 U/L -    Discharge instruction: per After Visit Summary and "Baby and Me Booklet".  After visit meds:    Medication List    TAKE these medications   acetaminophen 500 MG tablet Commonly known as:  TYLENOL Take 1,000 mg by mouth every 6 (  six) hours as needed for mild pain or headache.   albuterol 108 (90 Base) MCG/ACT inhaler Commonly known as:  PROVENTIL HFA;VENTOLIN HFA Inhale 2 puffs into the lungs every 6 (six) hours as needed for wheezing or shortness of breath.   ALPRAZolam 0.5 MG tablet Commonly known as:  XANAX Take 1 tablet (0.5 mg total) by mouth at bedtime as needed for anxiety. Take once a day by mouth as needed   aspirin 81 MG chewable tablet Chew 1 tablet (81 mg total) by mouth daily.   beclomethasone 40 MCG/ACT inhaler Commonly known as:  QVAR Inhale 2 puffs into the lungs 2 (two) times daily.   calcium carbonate 500 MG chewable tablet Commonly known as:  TUMS - dosed in mg elemental calcium Chew 2 tablets by mouth 3 (three) times daily as needed for indigestion or heartburn.   diphenhydrAMINE-zinc acetate cream Commonly known as:  BENADRYL Apply 1 application topically daily as needed for itching.   ibuprofen 600 MG tablet Commonly known as:  ADVIL,MOTRIN Take 1 tablet (600 mg total) by mouth every 6 (six) hours as  needed for headache, mild pain or cramping.   meclizine 12.5 MG tablet Commonly known as:  ANTIVERT Take 1 tablet (12.5 mg total) by mouth 3 (three) times daily as needed for dizziness.   Prenatal Vitamin 27-0.8 MG Tabs Take 1 tablet by mouth every morning. What changed:  when to take this       Diet: routine diet  Activity: Advance as tolerated. Pelvic rest for 6 weeks.   Outpatient follow up:6 weeks Follow up Appt:Future Appointments Date Time Provider Department Center  10/10/2016 10:45 AM Reva Bores, MD [facility]   Follow up Visit:No Follow-up on file.  Postpartum contraception: s/p BTL  Newborn Data: Live born female  Birth Weight: 6 lb 14.2 oz (3125 g) APGAR: 8, 9  Baby Feeding: Bottle Disposition:home with mother   09/10/2016 Frederik Pear, MD

## 2016-09-10 NOTE — Discharge Instructions (Signed)
Cesarean Delivery, Care After  Refer to this sheet in the next few weeks. These instructions provide you with information on caring for yourself after your procedure. Your health care provider may also give you specific instructions. Your treatment has been planned according to current medical practices, but problems sometimes occur. Call your health care provider if you have any problems or questions after you go home.  HOME CARE INSTRUCTIONS   Only take over-the-counter or prescription medications as directed by your health care provider.   Do not drink alcohol, especially if you are breastfeeding or taking medication to relieve pain.   Do not chew or smoke tobacco.   Continue to use good perineal care. Good perineal care includes:    Wiping your perineum from front to back.    Keeping your perineum clean.   Check your surgical cut (incision) daily for increased redness, drainage, swelling, or separation of skin.   Clean your incision gently with soap and water every day, and then pat it dry. If your health care provider says it is okay, leave the incision uncovered. Use a bandage (dressing) if the incision is draining fluid or appears irritated. If the adhesive strips across the incision do not fall off within 7 days, carefully peel them off.   Hug a pillow when coughing or sneezing until your incision is healed. This helps to relieve pain.   Do not use tampons or douche until your health care provider says it is okay.   Shower, wash your hair, and take tub baths as directed by your health care provider.   Wear a well-fitting bra that provides breast support.   Limit wearing support panties or control-top hose.   Drink enough fluids to keep your urine clear or pale yellow.   Eat high-fiber foods such as whole grain cereals and breads, brown rice, beans, and fresh fruits and vegetables every day. These foods may help prevent or relieve constipation.   Resume activities such as climbing stairs,  driving, lifting, exercising, or traveling as directed by your health care provider.   Talk to your health care provider about resuming sexual activities. This is dependent upon your risk of infection, your rate of healing, and your comfort and desire to resume sexual activity.   Try to have someone help you with your household activities and your newborn for at least a few days after you leave the hospital.   Rest as much as possible. Try to rest or take a nap when your newborn is sleeping.   Increase your activities gradually.   Keep all of your scheduled postpartum appointments. It is very important to keep your scheduled follow-up appointments. At these appointments, your health care provider will be checking to make sure that you are healing physically and emotionally.  SEEK MEDICAL CARE IF:    You are passing large clots from your vagina. Save any clots to show your health care provider.   You have a foul smelling discharge from your vagina.   You have trouble urinating.   You are urinating frequently.   You have pain when you urinate.   You have a change in your bowel movements.   You have increasing redness, pain, or swelling near your incision.   You have pus draining from your incision.   Your incision is separating.   You have painful, hard, or reddened breasts.   You have a severe headache.   You have blurred vision or see spots.   You feel sad   or depressed.   You have thoughts of hurting yourself or your newborn.   You have questions about your care, the care of your newborn, or medications.   You are dizzy or light-headed.   You have a rash.   You have pain, redness, or swelling at the site of the removed intravenous access (IV) tube.   You have nausea or vomiting.   You stopped breastfeeding and have not had a menstrual period within 12 weeks of stopping.   You are not breastfeeding and have not had a menstrual period within 12 weeks of delivery.   You have a fever.  SEEK  IMMEDIATE MEDICAL CARE IF:   You have persistent pain.   You have chest pain.   You have shortness of breath.   You faint.   You have leg pain.   You have stomach pain.   Your vaginal bleeding saturates 2 or more sanitary pads in 1 hour.  MAKE SURE YOU:    Understand these instructions.   Will watch your condition.   Will get help right away if you are not doing well or get worse.     This information is not intended to replace advice given to you by your health care provider. Make sure you discuss any questions you have with your health care provider.     Document Released: 09/08/2002 Document Revised: 01/07/2015 Document Reviewed: 08/13/2012  Elsevier Interactive Patient Education 2016 [employer]

## 2016-09-10 NOTE — Lactation Note (Signed)
This note was copied from a baby's chart. Lactation Consultation Note Mom is increasing the amount of BM the baby is receiving and reports that baby is sleeping better and longer now.  She intends to BF first and then pump if the baby does not BF well. Mom aware of support groups and outpatient services.   Patient Name: Girl Tamala BariJessica Tomasik ZOXWR'UToday's Date: 09/10/2016 Reason for consult: Follow-up assessment   Maternal Data    Feeding    [facility]                      Lactation Tools Discussed/Used     Consult Status      Soyla DryerJoseph, Lailah Marcelli 09/10/2016, 9:09 AM

## 2016-09-23 NOTE — Discharge Summary (Signed)
OB Discharge Summary                           Patient Name: Kim CooleyJessica R [name] DOB: 12/28/1990 MRN: [id number]  Date of admission: 09/05/2016 Delivering MD: [place] BingPICKENS, CHARLIE   Date of discharge: 09/10/2016  Admitting diagnosis: PREVIOUS C SECTION DISRE STERILZATION  Intrauterine pregnancy: 9847w2d     Secondary diagnosis:  Active Problems:   Status post repeat low transverse cesarean section   Ileus, postoperative   Status post tubal ligation  Additional problems: Postop Ileus                                      Discharge diagnosis: Term Pregnancy Delivered                                                                                                Post partum procedures:none  Augmentation: none  Complications: postop ileus  Hospital course:  26 y.o. yo [id number] at 3547w2d was admitted in Latent Labor on 09/05/2016. The patient went for cesarean section due to Prior Uterine Surgery (h/o previous C/S) and delivered a Viable infant, 09/05/2016. She also underwent bilateral tubal ligation Details of operation can be found in separate operative note. Patient had a postpartum course complicated by posoperative ileus, primarily colonic ileus. She was made NPO and ambulation encouraged. Her diet was then advanced slowly. On the morning of dicharge, she tolerated a regular diet.  She is ambulating, passing flatus, and urinating well.  Patient is discharged home in stable condition 09/10/16.         Physical exam Vitals:   09/08/16 1816 09/09/16 0612 09/09/16 1840 09/10/16 0553  BP: (!) 114/59 (!) 109/56 123/71 120/71  Pulse: 63 (!) 57 75 62  Resp: 18 16 18 20   Temp: 98.7 F (37.1 C) 98.2 F (36.8 C) 98.2 F (36.8 C) 98.3 F (36.8 C)  TempSrc: Oral Oral Oral   SpO2:   100%    General: alert, cooperative and no distress Lochia: appropriate Abdomen: soft, nontender, distention significantly improved Uterine Fundus: firm Incision: Healing well with no  significant drainage, No significant erythema, Dressing is clean, dry, and intact DVT Evaluation: No evidence of DVT seen on physical exam. Negative Homan's sign. No cords or calf tenderness. Labs: CBC Latest Ref Rng & Units 09/07/2016 09/06/2016 09/04/2016  WBC 4.0 - 10.5 K/uL 11.0(H) 13.1(H) 9.9  Hemoglobin 12.0 - 15.0 g/dL 10.2(L) 8.4(L) 10.0(L)  Hematocrit 36.0 - 46.0 % 30.6(L) 24.9(L) 30.0(L)  Platelets 150 - 400 K/uL 224 184 231    Recent Labs       Lab Results  Component Value Date   WBC 11.0 (H) 09/07/2016   HGB 10.2 (L) 09/07/2016   HCT 30.6 (L) 09/07/2016   MCV 87.9 09/07/2016   PLT 224 09/07/2016     CMP Latest Ref Rng & Units 09/09/2016  Glucose 65 - 99 mg/dL 98  BUN 6 - 20 mg/dL 8  Creatinine 1.300.44 - 8.651.00 mg/dL 7.840.53  Sodium 135 - 145 mmol/L 136  Potassium 3.5 - 5.1 mmol/L 3.5  Chloride 101 - 111 mmol/L 107  CO2 22 - 32 mmol/L 25  Calcium 8.9 - 10.3 mg/dL 7.9(L)  Total Protein 6.1 - 8.1 g/dL -  Total Bilirubin 0.2 - 1.2 mg/dL -  Alkaline Phos 33 - 409 U/L -  AST 10 - 30 U/L -  ALT 6 - 29 U/L -  Study Result   CLINICAL DATA:  Recent C-section. Postop day 5. Abdominal distention and pain.  EXAM: CT ABDOMEN AND PELVIS WITH CONTRAST  TECHNIQUE: Multidetector CT imaging of the abdomen and pelvis was performed using the standard protocol following bolus administration of intravenous contrast.  CONTRAST:  ISOVUE-300 IOPAMIDOL (ISOVUE-300) INJECTION 61%  COMPARISON:  [facility] September 07, 2016  FINDINGS: Lower chest: No acute abnormality.  Hepatobiliary: Cholelithiasis is identified with multiple stones in the gallbladder. A small cyst is seen in hepatic dome on series 2, image 5. No other suspicious hepatic lesions. The portal vein is normal.  Pancreas: Unremarkable. No pancreatic ductal dilatation or surrounding inflammatory changes.  Spleen: Normal in size without focal abnormality.  Adrenals/Urinary Tract: Adrenal glands are  unremarkable. Kidneys are normal, without renal calculi, focal lesion, or hydronephrosis. Bladder is unremarkable.  Stomach/Bowel: The stomach is normal. The small bowel is normal in caliber as well. The colon is distended and air-filled from the cecum to the splenic flexure. The colon is normal in caliber beyond this point. No evidence of colonic or rectal obstruction. The appendix is not seen but there is no secondary evidence of appendicitis.  Vascular/Lymphatic: No significant vascular findings are present. No enlarged abdominal or pelvic lymph nodes.  Reproductive: The uterus is enlarged. There is air and fluid in the endometrial canal consistent with recent C-section. No ovarian masses identified.  Other: Free fluid is identified in the pelvis. Multiple foci of free air seen in the abdomen consistent with recent C-section. Air in the U inferior right rectus muscles also postsurgical. No abscess identified.  Musculoskeletal: No acute bony abnormalities.  IMPRESSION: 1. Colonic ileus. Based on the scout view, this has improved since September 07, 2016. 2. Postsurgical changes in the uterus consistent with recent C-section. 3. Free air and free fluid consistent with recent C-section. 4. Cholelithiasis.   Electronically Signed   By: Gerome Sam III M.D   On: 09/09/2016 12:12      Discharge instruction: per After Visit Summary and "Baby and Me Booklet".  After visit meds:    Medication List    TAKE these medications   acetaminophen 500 MG tablet Commonly known as:  TYLENOL Take 1,000 mg by mouth every 6 (six) hours as needed for mild pain or headache.  albuterol 108 (90 Base) MCG/ACT inhaler Commonly known as:  PROVENTIL HFA;VENTOLIN HFA Inhale 2 puffs into the lungs every 6 (six) hours as needed for wheezing or shortness of breath.  ALPRAZolam 0.5 MG tablet Commonly known as:  XANAX Take 1 tablet (0.5 mg total) by mouth at bedtime as needed  for anxiety. Take once a day by mouth as needed  aspirin 81 MG chewable tablet Chew 1 tablet (81 mg total) by mouth daily.  beclomethasone 40 MCG/ACT inhaler Commonly known as:  QVAR Inhale 2 puffs into the lungs 2 (two) times daily.  calcium carbonate 500 MG chewable tablet Commonly known as:  TUMS - dosed in mg elemental calcium Chew 2 tablets by mouth 3 (three) times daily as needed for indigestion or heartburn.  diphenhydrAMINE-zinc acetate cream Commonly known as:  BENADRYL Apply 1 application topically daily as needed for itching.  ibuprofen 600 MG tablet Commonly known as:  ADVIL,MOTRIN Take 1 tablet (600 mg total) by mouth every 6 (six) hours as needed for headache, mild pain or cramping.  meclizine 12.5 MG tablet Commonly known as:  ANTIVERT Take 1 tablet (12.5 mg total) by mouth 3 (three) times daily as needed for dizziness.  Prenatal Vitamin 27-0.8 MG Tabs Take 1 tablet by mouth every morning. What changed:  when to take this      Diet: routine diet  Activity: Advance as tolerated. Pelvic rest for 6 weeks.   Outpatient follow up:6 weeks Follow up Appt:Future Appointments Date Time Provider Department Center  10/10/2016 10:45 AM Reva Bores, MD [facility]   Follow up Visit:No Follow-up on file.  Postpartum contraception: s/p BTL  Newborn Data: Live born female  Birth Weight: 6 lb 14.2 oz (3125 g) APGAR: 8, 9  Baby Feeding: Bottle Disposition:home with mother   09/10/2016 Frederik Pear, MD

## 2016-10-01 ENCOUNTER — Encounter (HOSPITAL_COMMUNITY): Payer: Self-pay | Admitting: *Deleted

## 2016-10-08 ENCOUNTER — Encounter: Payer: Self-pay | Admitting: *Deleted

## 2016-10-10 ENCOUNTER — Ambulatory Visit: Payer: Medicaid Other | Admitting: Family Medicine

## 2016-10-19 ENCOUNTER — Encounter: Payer: Self-pay | Admitting: *Deleted

## 2016-12-03 ENCOUNTER — Emergency Department
Admission: EM | Admit: 2016-12-03 | Discharge: 2016-12-03 | Disposition: A | Discharge status: Home / Self Care | Payer: Medicaid Other | Attending: Emergency Medicine | Admitting: Emergency Medicine

## 2016-12-03 ENCOUNTER — Encounter: Payer: Self-pay | Admitting: Medical Oncology

## 2016-12-03 DIAGNOSIS — N764 Abscess of vulva: Secondary | ICD-10-CM

## 2016-12-03 DIAGNOSIS — F17210 Nicotine dependence, cigarettes, uncomplicated: Secondary | ICD-10-CM | POA: Insufficient documentation

## 2016-12-03 DIAGNOSIS — Z79899 Other long term (current) drug therapy: Secondary | ICD-10-CM | POA: Insufficient documentation

## 2016-12-03 DIAGNOSIS — F1290 Cannabis use, unspecified, uncomplicated: Secondary | ICD-10-CM | POA: Insufficient documentation

## 2016-12-03 DIAGNOSIS — J4540 Moderate persistent asthma, uncomplicated: Secondary | ICD-10-CM | POA: Insufficient documentation

## 2016-12-03 MED ORDER — ONDANSETRON 4 MG PO TBDP: 4.0000 mg | ORAL_TABLET | Freq: Three times a day (TID) | ORAL | 0 refills | Status: DC | PRN

## 2016-12-03 MED ORDER — SULFAMETHOXAZOLE-TRIMETHOPRIM 800-160 MG PO TABS: 1.0000 | ORAL_TABLET | Freq: Two times a day (BID) | ORAL | 0 refills | Status: DC

## 2016-12-03 MED ORDER — ONDANSETRON 4 MG PO TBDP
4.0000 mg | ORAL_TABLET | Freq: Once | ORAL | Status: AC
  Administered 2016-12-03: 4 mg via ORAL
  Filled 2016-12-03: qty 1

## 2016-12-03 MED ORDER — OXYCODONE-ACETAMINOPHEN 5-325 MG PO TABS: 1.0000 | ORAL_TABLET | ORAL | 0 refills | Status: DC | PRN

## 2016-12-03 MED ORDER — OXYCODONE-ACETAMINOPHEN 5-325 MG PO TABS
1.0000 | ORAL_TABLET | Freq: Once | ORAL | Status: AC
  Administered 2016-12-03: 1 via ORAL
  Filled 2016-12-03: qty 1

## 2016-12-03 NOTE — ED Provider Notes (Signed)
[facility] Emergency Department Provider Note  ____________________________________________   First MD Initiated Contact with Patient 12/03/16 1008     (approximate)  I have reviewed the triage vital signs and the nursing notes.   HISTORY  Chief Complaint Abscess   HPI Kim [name] is a 26 y.o. female is here with an abscess to her right groin/labia area that started approximately 3-4 days ago. Patient states that she had some left over Septra DS that she's been taking 4 doses. Currently patient complains of nausea but no fever or chills. There's been no vomiting. Patient states that she has been a lot of pain for the last 2 days. After arrival to the emergency room area spontaneously began draining. Patient rates her pain as a 6 out of 10. Pain is increased with sitting or walking. Patient states the grandfather drove her to the emergency room.   Past Medical History:  Diagnosis Date  . Anemia   . Anxiety   . Asthma   . Chest pain   . Depression   . Fibromyalgia   . Gestational diabetes 2010, 2012  . Heartburn during pregnancy   . Rectal bleeding   . Seizures (HCC) 2015   x 1- unknown reason- passed out  . Ulcer [facility](HCC)     Patient Active Problem List   Diagnosis Date Noted  . Ileus, postoperative (HCC) 09/10/2016  . Status post tubal ligation 09/10/2016  . Status post repeat low transverse cesarean section 09/05/2016  . Anemia of mother in pregnancy, antepartum 06/29/2016  . GBS bacteriuria 04/06/2016  . Asthma affecting pregnancy, antepartum 02/08/2016  . Short interval between pregnancies affecting pregnancy, antepartum 02/08/2016  . Previous cesarean section complicating pregnancy, antepartum condition or complication 12/14/2014  . H/O pre-eclampsia in prior pregnancy, currently pregnant 12/14/2014  . Asthma, moderate persistent, well-controlled 12/14/2014  . Tobacco smoking affecting pregnancy, antepartum 12/14/2014  . Bipolar  depression (HCC) 12/14/2014  . Supervision of normal pregnancy 11/16/2014  . History of diabetes as a child and gestational diabetes 11/16/2014  . Underweight 11/16/2014    Past Surgical History:  Procedure Laterality Date  . CESAREAN SECTION     x 2  . CESAREAN SECTION WITH BILATERAL TUBAL LIGATION Bilateral 07/03/2015   Procedure: REPEAT CESAREAN SECTION ;  Surgeon: Tereso NewcomerUgonna A Anyanwu, MD;  Location: [facility];  Service: Obstetrics;  Laterality: Bilateral;  . CESAREAN SECTION WITH BILATERAL TUBAL LIGATION Bilateral 09/05/2016   Procedure: REPEAT CESAREAN SECTION WITH BILATERAL TUBAL LIGATION;  Surgeon:  Bingharlie Pickens, MD;  Location: [facility];  Service: Obstetrics;  Laterality: Bilateral;  . WISDOM TOOTH EXTRACTION      Prior to Admission medications   Medication Sig Start Date End Date Taking? Authorizing Provider  acetaminophen (TYLENOL) 500 MG tablet Take 1,000 mg by mouth every 6 (six) hours as needed for mild pain or headache.    Historical Provider, MD  albuterol (PROVENTIL HFA;VENTOLIN HFA) 108 (90 BASE) MCG/ACT inhaler Inhale 2 puffs into the lungs every 6 (six) hours as needed for wheezing or shortness of breath. Patient not taking: Reported on 08/30/2016 12/14/14   Reva Boresanya S Pratt, MD  ALPRAZolam Prudy Feeler(XANAX) 0.5 MG tablet Take 1 tablet (0.5 mg total) by mouth at bedtime as needed for anxiety. Take once a day by mouth as needed 06/27/16   Willodean Rosenthalarolyn Harraway-Smith, MD  aspirin 81 MG chewable tablet Chew 1 tablet (81 mg total) by mouth daily. 03/07/16   Reva Boresanya S Pratt, MD  beclomethasone (QVAR) 40 MCG/ACT inhaler  Inhale 2 puffs into the lungs 2 (two) times daily. Patient not taking: Reported on 08/30/2016 03/07/16   Reva Bores, MD  calcium carbonate (TUMS - DOSED IN MG ELEMENTAL CALCIUM) 500 MG chewable tablet Chew 2 tablets by mouth 3 (three) times daily as needed for indigestion or heartburn.    Historical Provider, MD  diphenhydrAMINE-zinc acetate (BENADRYL) cream Apply 1 application  topically daily as needed for itching.    Historical Provider, MD  ondansetron (ZOFRAN ODT) 4 MG disintegrating tablet Take 1 tablet (4 mg total) by mouth every 8 (eight) hours as needed for nausea or vomiting. 12/03/16   Tommi Rumps, PA-C  oxyCODONE (OXY IR/ROXICODONE) 5 MG immediate release tablet Take 1-2 tablets (5-10 mg total) by mouth every 4 (four) hours as needed for moderate pain. 09/10/16   Elsia Rodolph Bong, DO  oxyCODONE-acetaminophen (PERCOCET) 5-325 MG tablet Take 1 tablet by mouth every 4 (four) hours as needed for severe pain. 12/03/16   Tommi Rumps, PA-C  sulfamethoxazole-trimethoprim (BACTRIM DS,SEPTRA DS) 800-160 MG tablet Take 1 tablet by mouth 2 (two) times daily. 12/03/16   Tommi Rumps, PA-C    Allergies Patient has no known allergies.  Family History  Problem Relation Age of Onset  . Depression Mother   . Fibromyalgia Mother   . Cancer Mother     breast  . Diabetes Maternal Grandmother   . Hypertension Maternal Grandmother   . Arthritis Maternal Grandmother   . Thyroid disease Maternal Grandmother     Social History Social History  Substance Use Topics  . Smoking status: Current Some Day Smoker    Packs/day: 0.50    Types: Cigarettes  . Smokeless tobacco: Former Neurosurgeon    Quit date: 08/24/2014  . Alcohol use No    Review of Systems Constitutional: No fever/chills Eyes: No visual changes. ENT: No sore throat. Cardiovascular: Denies chest pain. Respiratory: Denies shortness of breath. Gastrointestinal: No abdominal pain. Positive nausea, no vomiting.   Genitourinary: Positive abscess right labia. Musculoskeletal: Negative for back pain. Skin: Positive abscess. Neurological: Negative for headaches, focal weakness or numbness.  10-point ROS otherwise negative.  ____________________________________________   PHYSICAL EXAM:  VITAL SIGNS: ED Triage Vitals  Enc Vitals Group     BP 12/03/16 0844 114/83     Pulse Rate 12/03/16 0844 (!) 110      Resp 12/03/16 0844 18     Temp 12/03/16 0844 98.5 F (36.9 C)     Temp src --      SpO2 12/03/16 0844 96 %     Weight 12/03/16 0841 108 lb (49 kg)     Height 12/03/16 0841 5\' 8"  (1.727 m)     Head Circumference --      Peak Flow --      Pain Score 12/03/16 0841 6     Pain Loc --      Pain Edu? --      Excl. in GC? --     Constitutional: Alert and oriented. Well appearing and in no acute distress. Eyes: Conjunctivae are normal. PERRL. EOMI. Head: Atraumatic. Nose: No congestion/rhinnorhea. Mouth/Throat: Mucous membranes are moist.  Oropharynx non-erythematous. Neck: No stridor.   Cardiovascular: Normal rate, regular rhythm. Grossly normal heart sounds.  Good peripheral circulation. Respiratory: Normal respiratory effort.  No retractions. Lungs CTAB. Genitourinary: Right labia externally is erythematous, tender, swollen. There is an area that is currently draining. There is a positive lymph node present about this area. Musculoskeletal: Moves upper and lower  extremities without difficulty. Gait is guarded due to pain in the right groin area. Neurologic:  Normal speech and language. No gross focal neurologic deficits are appreciated. No gait instability. Skin:  Skin is warm, dry and intact. Noted as above. Psychiatric: Mood and affect are normal. Speech and behavior are normal.  ____________________________________________   LABS (all labs ordered are listed, but only abnormal results are displayed)  Labs Reviewed - No data to display    PROCEDURES  Procedure(s) performed: None  Procedures  Critical Care performed: No  ____________________________________________   INITIAL IMPRESSION / ASSESSMENT AND PLAN / ED COURSE  Pertinent labs & imaging results that were available during my care of the patient were reviewed by me and considered in my medical decision making (see chart for details).    Clinical Course    Patient here was abscess to her right external labia  that began to drain spontaneously while in the emergency room. Patient was placed on Septra DS twice a day for 10 days. She was given Zofran ODT while in the emergency room for nausea. She is given a prescription for the same as well as Percocet dose in the emergency room for pain and prescription for home use. She is aware that this may cause drowsiness increase her risk for falling and she is not to operate any machine or drive while taking pain medication. She is [place]. We discussed frequent warm compresses or sitz baths for the next 24-48 hours.  ____________________________________________   FINAL CLINICAL IMPRESSION(S) / ED DIAGNOSES  Final diagnoses:  Abscess of right genital labia      NEW MEDICATIONS STARTED DURING THIS VISIT:  Discharge Medication List as of 12/03/2016 10:27 AM    START taking these medications   Details  ondansetron (ZOFRAN ODT) 4 MG disintegrating tablet Take 1 tablet (4 mg total) by mouth every 8 (eight) hours as needed for nausea or vomiting., Starting Mon 12/03/2016, Print    oxyCODONE-acetaminophen (PERCOCET) 5-325 MG tablet Take 1 tablet by mouth every 4 (four) hours as needed for severe pain., Starting Mon 12/03/2016, Print    sulfamethoxazole-trimethoprim (BACTRIM DS,SEPTRA DS) 800-160 MG tablet Take 1 tablet by mouth 2 (two) times daily., Starting Mon 12/03/2016, Print         Note:  This document was prepared using Dragon voice recognition software and may include unintentional dictation errors.    Tommi Rumpshonda L Jailani Hogans, PA-C 12/03/16 1109    Emily FilbertJonathan E Williams, MD 12/03/16 [phone]

## 2016-12-03 NOTE — ED Notes (Signed)
States she developed a possible abscess area to vaginal area 2 days ago  Swelling became worse with increased pain this am  Right side of labia swollen

## 2016-12-03 NOTE — Discharge Instructions (Signed)
Warm compresses or sitz baths 2-3 times a day. Begin taking Septra DS twice a day for the entire 10 days. Zofran as needed for nausea. And Percocet as needed for pain. Do not take pain medication other than directed on the label. Call your primary care doctor if any continued problems for possible referral to a surgeon.

## 2016-12-03 NOTE — ED Triage Notes (Signed)
Pt reports abscess to groin x 3 days.

## 2017-02-03 ENCOUNTER — Emergency Department
Admission: EM | Admit: 2017-02-03 | Discharge: 2017-02-03 | Discharge status: Against Medical Advice | Payer: Medicaid Other

## 2017-03-08 ENCOUNTER — Emergency Department
Admission: EM | Admit: 2017-03-08 | Discharge: 2017-03-08 | Disposition: A | Discharge status: Home / Self Care | Payer: Medicaid Other | Attending: Student in an Organized Health Care Education/Training Program | Admitting: Student in an Organized Health Care Education/Training Program

## 2017-03-08 ENCOUNTER — Encounter: Payer: Self-pay | Admitting: Emergency Medicine

## 2017-03-08 DIAGNOSIS — F418 Other specified anxiety disorders: Secondary | ICD-10-CM | POA: Diagnosis not present

## 2017-03-08 DIAGNOSIS — Z7982 Long term (current) use of aspirin: Secondary | ICD-10-CM | POA: Insufficient documentation

## 2017-03-08 DIAGNOSIS — H60503 Unspecified acute noninfective otitis externa, bilateral: Secondary | ICD-10-CM | POA: Insufficient documentation

## 2017-03-08 DIAGNOSIS — F17210 Nicotine dependence, cigarettes, uncomplicated: Secondary | ICD-10-CM | POA: Insufficient documentation

## 2017-03-08 DIAGNOSIS — J45909 Unspecified asthma, uncomplicated: Secondary | ICD-10-CM | POA: Insufficient documentation

## 2017-03-08 DIAGNOSIS — H60313 Diffuse otitis externa, bilateral: Secondary | ICD-10-CM

## 2017-03-08 DIAGNOSIS — Z79899 Other long term (current) drug therapy: Secondary | ICD-10-CM | POA: Insufficient documentation

## 2017-03-08 DIAGNOSIS — H9203 Otalgia, bilateral: Secondary | ICD-10-CM | POA: Diagnosis present

## 2017-03-08 DIAGNOSIS — D649 Anemia, unspecified: Secondary | ICD-10-CM | POA: Insufficient documentation

## 2017-03-08 MED ORDER — IBUPROFEN 600 MG PO TABS: 600.0000 mg | ORAL_TABLET | Freq: Three times a day (TID) | ORAL | 0 refills | Status: DC | PRN

## 2017-03-08 MED ORDER — TRAMADOL HCL 50 MG PO TABS: 50.0000 mg | ORAL_TABLET | Freq: Four times a day (QID) | ORAL | 0 refills | Status: DC | PRN

## 2017-03-08 MED ORDER — OXYCODONE-ACETAMINOPHEN 5-325 MG PO TABS
1.0000 | ORAL_TABLET | Freq: Once | ORAL | Status: AC
  Administered 2017-03-08: 1 via ORAL
  Filled 2017-03-08: qty 1

## 2017-03-08 MED ORDER — NEOMYCIN-POLYMYXIN-HC 1 % OT SOLN: 3.0000 [drp] | Freq: Four times a day (QID) | OTIC | 0 refills | Status: DC

## 2017-03-08 MED ORDER — IBUPROFEN 600 MG PO TABS
600.0000 mg | ORAL_TABLET | Freq: Once | ORAL | Status: AC
  Administered 2017-03-08: 600 mg via ORAL
  Filled 2017-03-08: qty 1

## 2017-03-08 NOTE — ED Provider Notes (Signed)
[facility] Emergency Department Provider Note   ____________________________________________   First MD Initiated Contact with Patient 03/08/17 [phone]     (approximate)  I have reviewed the triage vital signs and the nursing notes.   HISTORY  Chief Complaint Otalgia    HPI Kim [name] is a 27 y.o. female patient complaining of bilateral ear pain. Patient stated left ear is worst in the right. Patient awakened a pain this morning. Patient denies any URI sinus symptoms.Patient rates the pain as 8/10. Patient described a pain as "sharp and achy". No palliative measures for this complaint.   Past Medical History:  Diagnosis Date  . Anemia   . Anxiety   . Asthma   . Chest pain   . Depression   . Fibromyalgia   . Gestational diabetes 2010, 2012  . Heartburn during pregnancy   . Rectal bleeding   . Seizures (HCC) 2015   x 1- unknown reason- passed out  . Ulcer [facility])     Patient Active Problem List   Diagnosis Date Noted  . Ileus, postoperative (HCC) 09/10/2016  . Status post tubal ligation 09/10/2016  . Status post repeat low transverse cesarean section 09/05/2016  . Anemia of mother in pregnancy, antepartum 06/29/2016  . GBS bacteriuria 04/06/2016  . Asthma affecting pregnancy, antepartum 02/08/2016  . Short interval between pregnancies affecting pregnancy, antepartum 02/08/2016  . Previous cesarean section complicating pregnancy, antepartum condition or complication 12/14/2014  . H/O pre-eclampsia in prior pregnancy, currently pregnant 12/14/2014  . Asthma, moderate persistent, well-controlled 12/14/2014  . Tobacco smoking affecting pregnancy, antepartum 12/14/2014  . Bipolar depression (HCC) 12/14/2014  . Supervision of normal pregnancy 11/16/2014  . History of diabetes as a child and gestational diabetes 11/16/2014  . Underweight 11/16/2014    Past Surgical History:  Procedure Laterality Date  . CESAREAN SECTION     x 2  .  CESAREAN SECTION WITH BILATERAL TUBAL LIGATION Bilateral 07/03/2015   Procedure: REPEAT CESAREAN SECTION ;  Surgeon: Tereso Newcomer, MD;  Location: [facility];  Service: Obstetrics;  Laterality: Bilateral;  . CESAREAN SECTION WITH BILATERAL TUBAL LIGATION Bilateral 09/05/2016   Procedure: REPEAT CESAREAN SECTION WITH BILATERAL TUBAL LIGATION;  Surgeon: Berry[place] Bing, MD;  Location: [facility];  Service: Obstetrics;  Laterality: Bilateral;  . WISDOM TOOTH EXTRACTION      Prior to Admission medications   Medication Sig Start Date End Date Taking? Authorizing Provider  acetaminophen (TYLENOL) 500 MG tablet Take 1,000 mg by mouth every 6 (six) hours as needed for mild pain or headache.    Historical Provider, MD  albuterol (PROVENTIL HFA;VENTOLIN HFA) 108 (90 BASE) MCG/ACT inhaler Inhale 2 puffs into the lungs every 6 (six) hours as needed for wheezing or shortness of breath. Patient not taking: Reported on 08/30/2016 12/14/14   Reva Bores, MD  ALPRAZolam Prudy Feeler) 0.5 MG tablet Take 1 tablet (0.5 mg total) by mouth at bedtime as needed for anxiety. Take once a day by mouth as needed 06/27/16   Willodean Rosenthal, MD  aspirin 81 MG chewable tablet Chew 1 tablet (81 mg total) by mouth daily. 03/07/16   Reva Bores, MD  beclomethasone (QVAR) 40 MCG/ACT inhaler Inhale 2 puffs into the lungs 2 (two) times daily. Patient not taking: Reported on 08/30/2016 03/07/16   Reva Bores, MD  calcium carbonate (TUMS - DOSED IN MG ELEMENTAL CALCIUM) 500 MG chewable tablet Chew 2 tablets by mouth 3 (three) times daily as needed for indigestion  or heartburn.    Historical Provider, MD  diphenhydrAMINE-zinc acetate (BENADRYL) cream Apply 1 application topically daily as needed for itching.    Historical Provider, MD  ibuprofen (ADVIL,MOTRIN) 600 MG tablet Take 1 tablet (600 mg total) by mouth every 8 (eight) hours as needed. 03/08/17   Joni Reining, PA-C  NEOMYCIN-POLYMYXIN-HYDROCORTISONE (CORTISPORIN) 1 % SOLN  otic solution Place 3 drops into both ears every 6 (six) hours. 03/08/17   Joni Reining, PA-C  ondansetron (ZOFRAN ODT) 4 MG disintegrating tablet Take 1 tablet (4 mg total) by mouth every 8 (eight) hours as needed for nausea or vomiting. 12/03/16   Tommi Rumps, PA-C  oxyCODONE (OXY IR/ROXICODONE) 5 MG immediate release tablet Take 1-2 tablets (5-10 mg total) by mouth every 4 (four) hours as needed for moderate pain. 09/10/16   Elsia Rodolph Bong, DO  oxyCODONE-acetaminophen (PERCOCET) 5-325 MG tablet Take 1 tablet by mouth every 4 (four) hours as needed for severe pain. 12/03/16   Tommi Rumps, PA-C  sulfamethoxazole-trimethoprim (BACTRIM DS,SEPTRA DS) 800-160 MG tablet Take 1 tablet by mouth 2 (two) times daily. 12/03/16   Tommi Rumps, PA-C  traMADol (ULTRAM) 50 MG tablet Take 1 tablet (50 mg total) by mouth every 6 (six) hours as needed for moderate pain. 03/08/17   Joni Reining, PA-C    Allergies Patient has no known allergies.  Family History  Problem Relation Age of Onset  . Depression Mother   . Fibromyalgia Mother   . Cancer Mother     breast  . Diabetes Maternal Grandmother   . Hypertension Maternal Grandmother   . Arthritis Maternal Grandmother   . Thyroid disease Maternal Grandmother     Social History Social History  Substance Use Topics  . Smoking status: Current Some Day Smoker    Packs/day: 0.50    Types: Cigarettes  . Smokeless tobacco: Former Neurosurgeon    Quit date: 08/24/2014  . Alcohol use No    Review of Systems Constitutional: No fever/chills Eyes: No visual changes. ENT: No sore throat. Cardiovascular: Denies chest pain. Respiratory: Denies shortness of breath. Gastrointestinal: No abdominal pain.  No nausea, no vomiting.  No diarrhea.  No constipation. Genitourinary: Negative for dysuria. Musculoskeletal: Negative for back pain. Skin: Negative for rash. Neurological: Negative for headaches, focal weakness or numbness. Psychiatric:Anxiety and  depression  Hematological/Lymphatic:Anemia ____________________________________________   PHYSICAL EXAM:  VITAL SIGNS: ED Triage Vitals  Enc Vitals Group     BP 03/08/17 0930 (!) 104/58     Pulse Rate 03/08/17 0930 98     Resp 03/08/17 0930 20     Temp 03/08/17 0930 97.9 F (36.6 C)     Temp Source 03/08/17 0930 Oral     SpO2 03/08/17 0930 100 %     Weight 03/08/17 0926 108 lb (49 kg)     Height --      Head Circumference --      Peak Flow --      Pain Score 03/08/17 0926 8     Pain Loc --      Pain Edu? --      Excl. in GC? --     Constitutional: Alert and oriented. Well appearing and in no acute distress. Eyes: Conjunctivae are normal. PERRL. EOMI. Head: Atraumatic. Nose: No congestion/rhinnorhea. Mouth/Throat: Mucous membranes are moist.  Oropharynx non-erythematous. EARS: Bilateral edematous ear canals. Neck: No stridor. No cervical spine tenderness to palpation. Hematological/Lymphatic/Immunilogical: No cervical lymphadenopathy. Cardiovascular: Normal rate, regular rhythm. Grossly normal  heart sounds.  Good peripheral circulation. Respiratory: Normal respiratory effort.  No retractions. Lungs CTAB. Gastrointestinal: Soft and nontender. No distention. No abdominal bruits. No CVA tenderness. Musculoskeletal: No lower extremity tenderness nor edema.  No joint effusions. Neurologic:  Normal speech and language. No gross focal neurologic deficits are appreciated. No gait instability. Skin:  Skin is warm, dry and intact. No rash noted. Psychiatric: Mood and affect are normal. Speech and behavior are normal.  ____________________________________________   LABS (all labs ordered are listed, but only abnormal results are displayed)  Labs Reviewed - No data to display ____________________________________________  EKG   ____________________________________________  RADIOLOGY   ____________________________________________   PROCEDURES  Procedure(s) performed:  None  Procedures  Critical Care performed: No  ____________________________________________   INITIAL IMPRESSION / ASSESSMENT AND PLAN / ED COURSE  Pertinent labs & imaging results that were available during my care of the patient were reviewed by me and considered in my medical decision making (see chart for details).  Otitis external. Patient given discharge care instruction. Patient given prescription for Cortisporin eardrops, tramadol, and ibuprofen. Follow-up family doctor is no improvement 3-5 days.      ____________________________________________   FINAL CLINICAL IMPRESSION(S) / ED DIAGNOSES  Final diagnoses:  Acute diffuse otitis externa of both ears      NEW MEDICATIONS STARTED DURING THIS VISIT:  New Prescriptions   IBUPROFEN (ADVIL,MOTRIN) 600 MG TABLET    Take 1 tablet (600 mg total) by mouth every 8 (eight) hours as needed.   NEOMYCIN-POLYMYXIN-HYDROCORTISONE (CORTISPORIN) 1 % SOLN OTIC SOLUTION    Place 3 drops into both ears every 6 (six) hours.   TRAMADOL (ULTRAM) 50 MG TABLET    Take 1 tablet (50 mg total) by mouth every 6 (six) hours as needed for moderate pain.     Note:  This document was prepared using Dragon voice recognition software and may include unintentional dictation errors.    Joni Reiningonald K Tracina Beaumont, PA-C 03/08/17 [id number]    Willy EddyPatrick Robinson, MD 03/08/17 [phone]

## 2017-03-08 NOTE — ED Triage Notes (Signed)
Pt to ed with c/o left ear pain that started this am when she awoke.

## 2017-03-12 ENCOUNTER — Encounter: Payer: Self-pay | Admitting: Emergency Medicine

## 2017-03-12 ENCOUNTER — Emergency Department
Admission: EM | Admit: 2017-03-12 | Discharge: 2017-03-12 | Disposition: A | Discharge status: Home / Self Care | Payer: Medicaid Other | Attending: Emergency Medicine | Admitting: Emergency Medicine

## 2017-03-12 DIAGNOSIS — J45909 Unspecified asthma, uncomplicated: Secondary | ICD-10-CM | POA: Diagnosis not present

## 2017-03-12 DIAGNOSIS — H60502 Unspecified acute noninfective otitis externa, left ear: Secondary | ICD-10-CM | POA: Insufficient documentation

## 2017-03-12 DIAGNOSIS — Z79899 Other long term (current) drug therapy: Secondary | ICD-10-CM | POA: Insufficient documentation

## 2017-03-12 DIAGNOSIS — Z7982 Long term (current) use of aspirin: Secondary | ICD-10-CM | POA: Insufficient documentation

## 2017-03-12 DIAGNOSIS — F17210 Nicotine dependence, cigarettes, uncomplicated: Secondary | ICD-10-CM | POA: Insufficient documentation

## 2017-03-12 DIAGNOSIS — H9202 Otalgia, left ear: Secondary | ICD-10-CM | POA: Diagnosis present

## 2017-03-12 MED ORDER — NEOMYCIN-POLYMYXIN-HC 3.5-10000-1 OT SOLN: 3.0000 [drp] | Freq: Three times a day (TID) | OTIC | 0 refills | Status: AC

## 2017-03-12 MED ORDER — TRAMADOL HCL 50 MG PO TABS: 50.0000 mg | ORAL_TABLET | Freq: Four times a day (QID) | ORAL | 0 refills | Status: AC | PRN

## 2017-03-12 NOTE — ED Provider Notes (Signed)
[facility] Emergency Department Provider Note   ____________________________________________    I have reviewed the triage vital signs and the nursing notes.   HISTORY  Chief Complaint Otalgia and Hearing Loss     HPI Kim [name] is a 27 y.o. female who presents with complaints of left ear pain. The patient was treated 3 days ago for otitis externa. She reports she has been compliant with her eardrops but has not had any improvement. She denies fevers or chills. No headache.   Past Medical History:  Diagnosis Date  . Anemia   . Anxiety   . Asthma   . Chest pain   . Depression   . Fibromyalgia   . Gestational diabetes 2010, 2012  . Heartburn during pregnancy   . Rectal bleeding   . Seizures (HCC) 2015   x 1- unknown reason- passed out  . Ulcer [facility](HCC)     Patient Active Problem List   Diagnosis Date Noted  . Ileus, postoperative (HCC) 09/10/2016  . Status post tubal ligation 09/10/2016  . Status post repeat low transverse cesarean section 09/05/2016  . Anemia of mother in pregnancy, antepartum 06/29/2016  . GBS bacteriuria 04/06/2016  . Asthma affecting pregnancy, antepartum 02/08/2016  . Short interval between pregnancies affecting pregnancy, antepartum 02/08/2016  . Previous cesarean section complicating pregnancy, antepartum condition or complication 12/14/2014  . H/O pre-eclampsia in prior pregnancy, currently pregnant 12/14/2014  . Asthma, moderate persistent, well-controlled 12/14/2014  . Tobacco smoking affecting pregnancy, antepartum 12/14/2014  . Bipolar depression (HCC) 12/14/2014  . Supervision of normal pregnancy 11/16/2014  . History of diabetes as a child and gestational diabetes 11/16/2014  . Underweight 11/16/2014    Past Surgical History:  Procedure Laterality Date  . CESAREAN SECTION     x 2  . CESAREAN SECTION WITH BILATERAL TUBAL LIGATION Bilateral 07/03/2015   Procedure: REPEAT CESAREAN SECTION ;  Surgeon:  Tereso NewcomerUgonna A Anyanwu, MD;  Location: [facility];  Service: Obstetrics;  Laterality: Bilateral;  . CESAREAN SECTION WITH BILATERAL TUBAL LIGATION Bilateral 09/05/2016   Procedure: REPEAT CESAREAN SECTION WITH BILATERAL TUBAL LIGATION;  Surgeon: [place] Bingharlie Pickens, MD;  Location: [facility];  Service: Obstetrics;  Laterality: Bilateral;  . WISDOM TOOTH EXTRACTION      Prior to Admission medications   Medication Sig Start Date End Date Taking? Authorizing Provider  acetaminophen (TYLENOL) 500 MG tablet Take 1,000 mg by mouth every 6 (six) hours as needed for mild pain or headache.    Historical Provider, MD  albuterol (PROVENTIL HFA;VENTOLIN HFA) 108 (90 BASE) MCG/ACT inhaler Inhale 2 puffs into the lungs every 6 (six) hours as needed for wheezing or shortness of breath. Patient not taking: Reported on 08/30/2016 12/14/14   Reva Boresanya S Pratt, MD  ALPRAZolam Prudy Feeler(XANAX) 0.5 MG tablet Take 1 tablet (0.5 mg total) by mouth at bedtime as needed for anxiety. Take once a day by mouth as needed 06/27/16   Willodean Rosenthalarolyn Harraway-Smith, MD  aspirin 81 MG chewable tablet Chew 1 tablet (81 mg total) by mouth daily. 03/07/16   Reva Boresanya S Pratt, MD  beclomethasone (QVAR) 40 MCG/ACT inhaler Inhale 2 puffs into the lungs 2 (two) times daily. Patient not taking: Reported on 08/30/2016 03/07/16   Reva Boresanya S Pratt, MD  calcium carbonate (TUMS - DOSED IN MG ELEMENTAL CALCIUM) 500 MG chewable tablet Chew 2 tablets by mouth 3 (three) times daily as needed for indigestion or heartburn.    Historical Provider, MD  diphenhydrAMINE-zinc acetate (BENADRYL) cream Apply  1 application topically daily as needed for itching.    Historical Provider, MD  ibuprofen (ADVIL,MOTRIN) 600 MG tablet Take 1 tablet (600 mg total) by mouth every 8 (eight) hours as needed. 03/08/17   Joni Reining, PA-C  neomycin-polymyxin-hydrocortisone (CORTISPORIN) otic solution Place 3 drops into the left ear 3 (three) times daily. 03/12/17 03/22/17  Jene Every, MD  ondansetron (ZOFRAN  ODT) 4 MG disintegrating tablet Take 1 tablet (4 mg total) by mouth every 8 (eight) hours as needed for nausea or vomiting. 12/03/16   Tommi Rumps, PA-C  oxyCODONE (OXY IR/ROXICODONE) 5 MG immediate release tablet Take 1-2 tablets (5-10 mg total) by mouth every 4 (four) hours as needed for moderate pain. 09/10/16   Elsia Rodolph Bong, DO  oxyCODONE-acetaminophen (PERCOCET) 5-325 MG tablet Take 1 tablet by mouth every 4 (four) hours as needed for severe pain. 12/03/16   Tommi Rumps, PA-C  sulfamethoxazole-trimethoprim (BACTRIM DS,SEPTRA DS) 800-160 MG tablet Take 1 tablet by mouth 2 (two) times daily. 12/03/16   Tommi Rumps, PA-C  traMADol (ULTRAM) 50 MG tablet Take 1 tablet (50 mg total) by mouth every 6 (six) hours as needed. 03/12/17 03/12/18  Jene Every, MD     Allergies Patient has no known allergies.  Family History  Problem Relation Age of Onset  . Depression Mother   . Fibromyalgia Mother   . Cancer Mother     breast  . Diabetes Maternal Grandmother   . Hypertension Maternal Grandmother   . Arthritis Maternal Grandmother   . Thyroid disease Maternal Grandmother     Social History Social History  Substance Use Topics  . Smoking status: Current Some Day Smoker    Packs/day: 0.50    Types: Cigarettes  . Smokeless tobacco: Former Neurosurgeon    Quit date: 08/24/2014  . Alcohol use No    Review of Systems  Constitutional: No fever/chills  ENT: Left ear pain, no sore throat    Musculoskeletal: Negative for myalgias Skin: Negative for rash. Neurological: Negative for headaches     ____________________________________________   PHYSICAL EXAM:  VITAL SIGNS: ED Triage Vitals  Enc Vitals Group     BP 03/12/17 1016 118/72     Pulse Rate 03/12/17 1016 72     Resp 03/12/17 1016 16     Temp 03/12/17 1016 98 F (36.7 C)     Temp Source 03/12/17 1016 Oral     SpO2 03/12/17 1016 100 %     Weight --      Height --      Head Circumference --      Peak Flow --       Pain Score 03/12/17 1019 10     Pain Loc --      Pain Edu? --      Excl. in GC? --     Constitutional: Alert and oriented. No acute distress. Pleasant and interactive Eyes: Conjunctivae are normal.  Ears: Normal pinnae, left ear significant external canal swelling consistent with otitis externa. Right ear less significant swelling, patent canal Head: Atraumatic. Nose: No congestion/rhinnorhea. Mouth/Throat: Mucous membranes are moist.   Cardiovascular: Normal rate, regular rhythm.   Neurologic:  Normal speech and language. No gross focal neurologic deficits are appreciated.   Skin:  Skin is warm, dry and intact. No rash noted.   ____________________________________________   LABS (all labs ordered are listed, but only abnormal results are displayed)  Labs Reviewed - No data to display ____________________________________________  EKG  ____________________________________________  RADIOLOGY  None ____________________________________________   PROCEDURES  Procedure(s) performed: yes  Ear wick insertion  Ear Wick inserted into left ear using forceps. Patient tolerated well. No complications.     Critical Care performed: No ____________________________________________   INITIAL IMPRESSION / ASSESSMENT AND PLAN / ED COURSE  Pertinent labs & imaging results that were available during my care of the patient were reviewed by me and considered in my medical decision making (see chart for details).  Ear wick inserted into left ear to allow for penetration of eardrops   ____________________________________________   FINAL CLINICAL IMPRESSION(S) / ED DIAGNOSES  Final diagnoses:  Acute otitis externa of left ear, unspecified type      NEW MEDICATIONS STARTED DURING THIS VISIT:  Discharge Medication List as of 03/12/2017 10:51 AM       Note:  This document was prepared using Dragon voice recognition software and may include unintentional dictation  errors.    Jene Every, MD 03/12/17 1341

## 2017-03-12 NOTE — ED Triage Notes (Signed)
Seen Friday in ED and diagnosed with an ear infection.  Ear drops given, but symptoms have worsened.  C/O hearing loss to left ear and green drainage that is blood tinged.

## 2017-03-12 NOTE — ED Notes (Signed)
ED Provider at bedside. 

## 2017-04-08 IMAGING — US US MFM OB FOLLOW-UP
1 series · 14 of 28 positions shown · non-contrast
Comparison: none

[Series 1: us mfm ob follow-up · 14 of 66 slices shown]
[im 3/66]
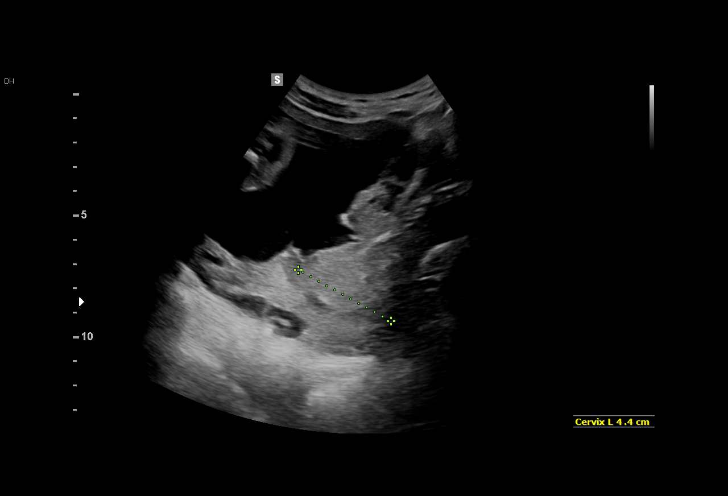
[im 8/66]
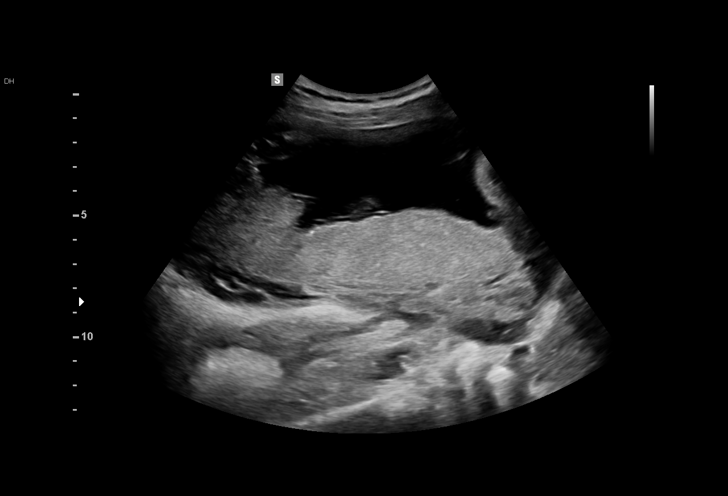
[im 13/66]
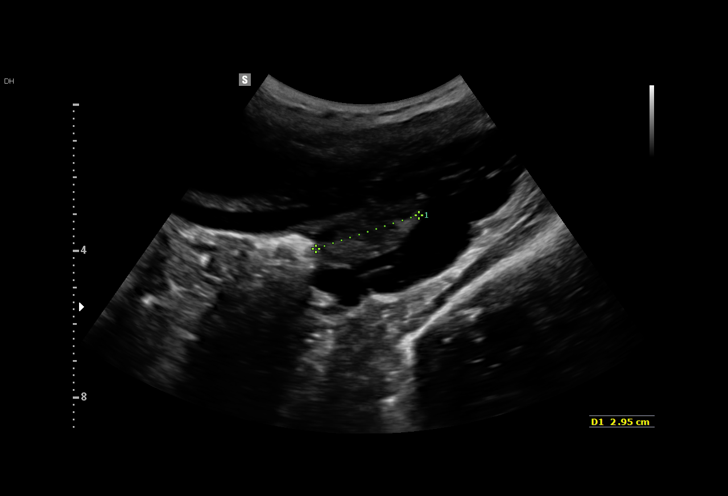
[im 17/66]
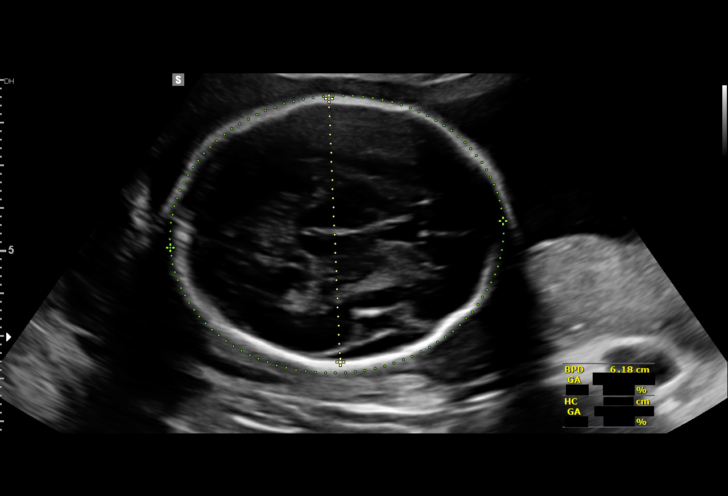
[im 22/66]
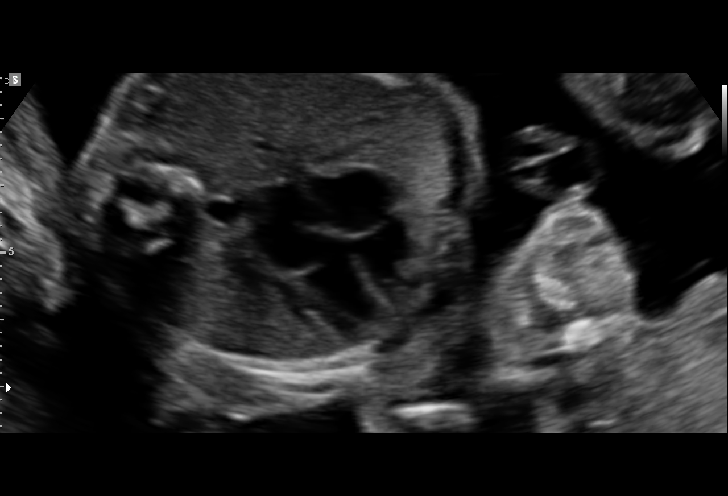
[im 27/66]
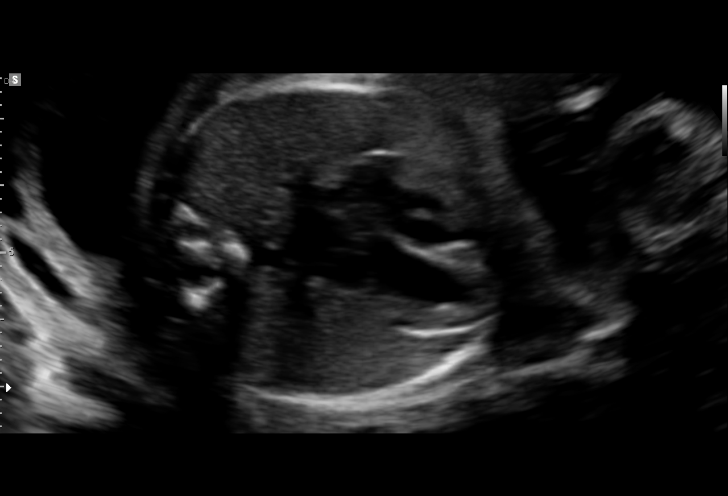
[im 32/66]
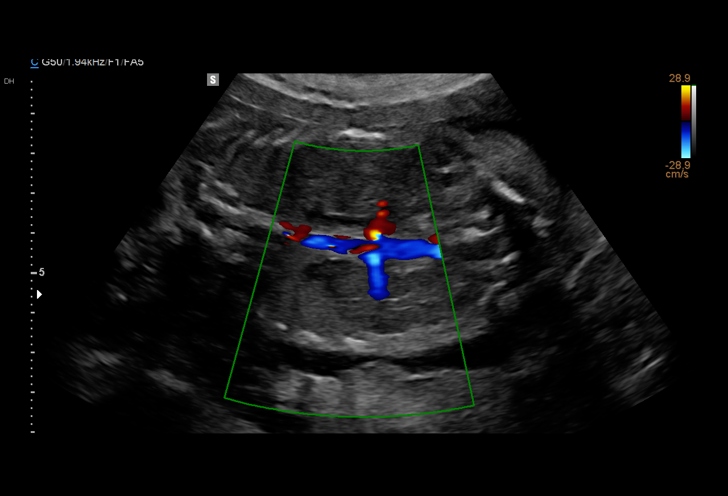
[im 37/66]
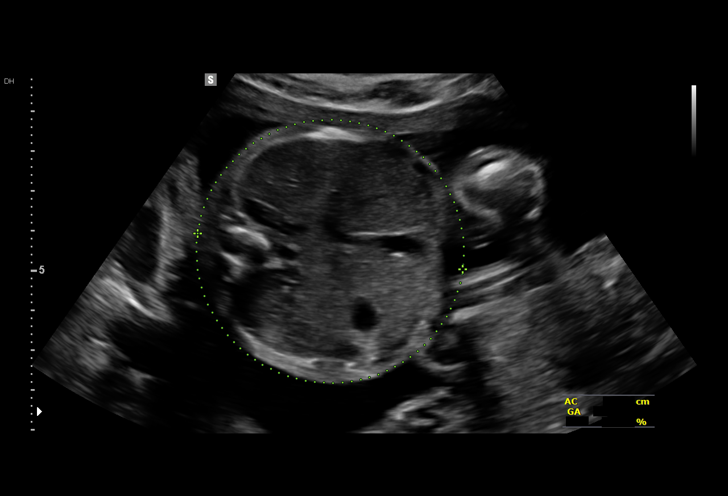
[im 41/66]
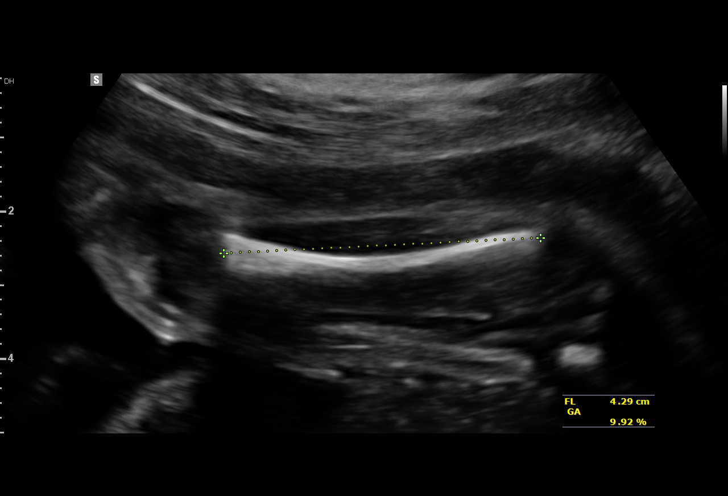
[im 46/66]
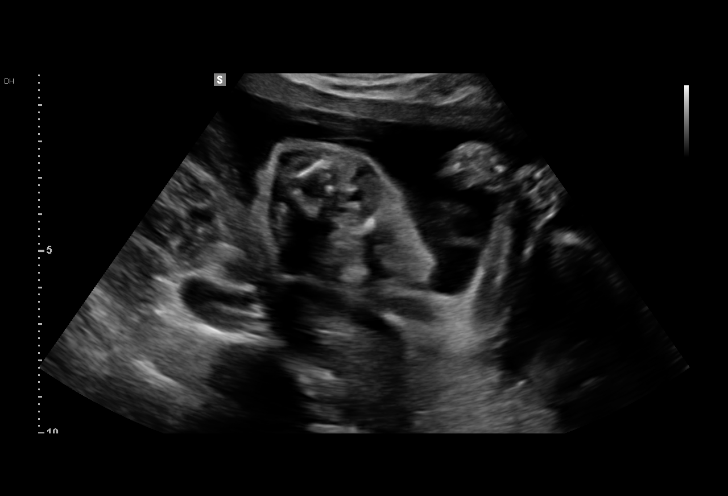
[im 51/66]
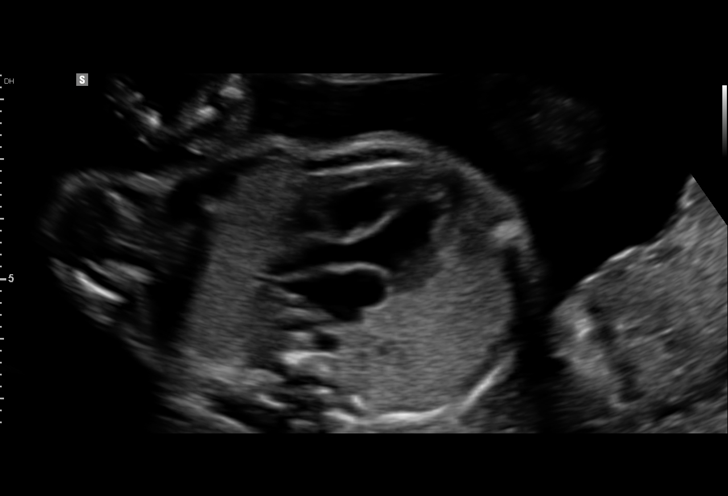
[im 56/66]
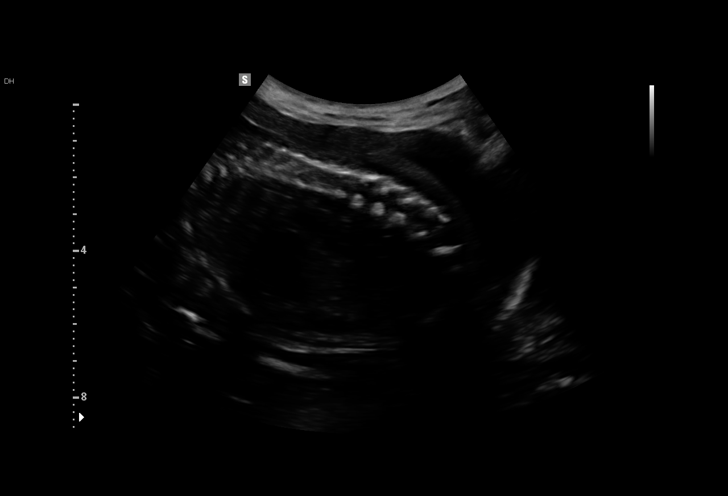
[im 61/66]
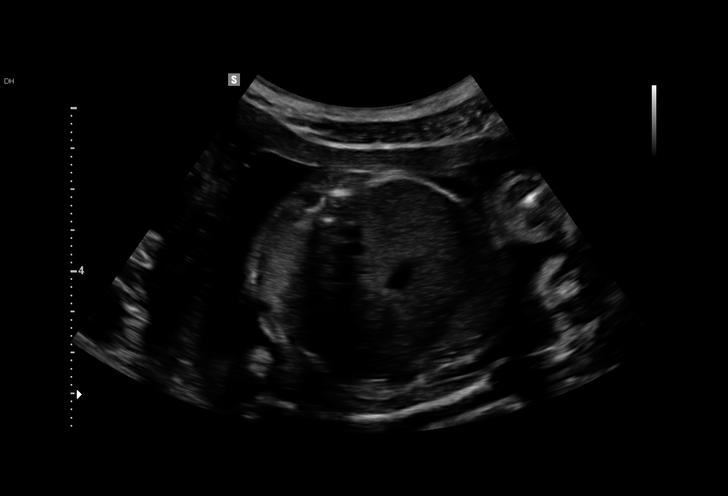
[im 66/66]
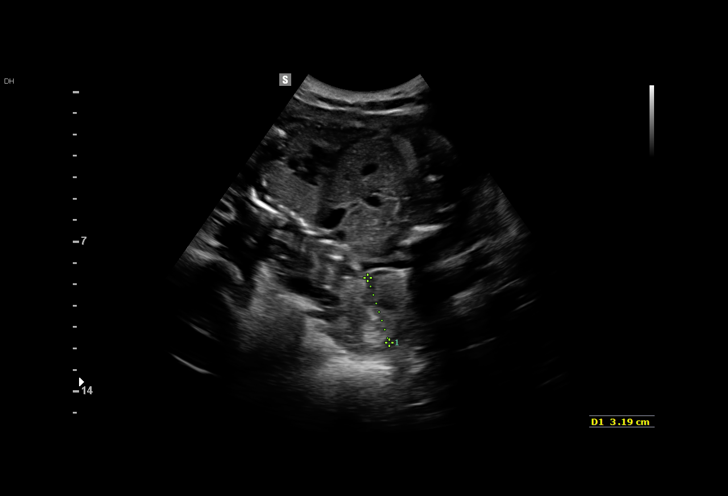

[14 of 28 positions shown; findings below may reference images not displayed]

[REDACTED]

1  MAMI KESU           [id number]      [id number]     [id number]
Indications

25 weeks gestation of pregnancy
Follow-up incomplete fetal anatomic            Z36
evaluation
History of cesarean delivery, currently
pregnant
OB History

Blood Type:            Height:  5'8"   Weight (lb):  105       BMI:
Gravidity:    5         Term:   3        Prem:   0        SAB:   1
TOP:          0       Ectopic:  0        Living: 3
Fetal Evaluation

Num Of Fetuses:     1
Fetal Heart         157
Rate(bpm):
Cardiac Activity:   Observed
Presentation:       Breech
Placenta:           Posterior, above cervical os
P. Cord Insertion:  Marginal insertion

Amniotic Fluid
AFI FV:      Subjectively within normal limits

Largest Pocket(cm)
4.7
Biometry

BPD:      61.8  mm     G. Age:  25w 1d         40  %    CI:        74.02   %    70 - 86
FL/HC:      18.9   %    18.7 -
HC:      228.1  mm     G. Age:  24w 6d         20  %    HC/AC:      1.10        1.04 -
AC:      208.1  mm     G. Age:  25w 3d         48  %    FL/BPD:     69.6   %    71 - 87
FL:         43  mm     G. Age:  24w 1d         10  %    FL/AC:      20.7   %    20 - 24
HUM:      41.1  mm     G. Age:  25w 0d         37  %

Est. FW:     740  gm    1 lb 10 oz      46  %
Gestational Age

U/S Today:     24w 6d                                        EDD:   09/12/16
Best:          25w 1d     Det. By:  Early Ultrasound         EDD:   09/10/16
(02/06/16)
Anatomy

Cranium:               Appears normal         LVOT:                   Appears normal
Cavum:                 Appears normal         Aortic Arch:            Appears normal
Ventricles:            Appears normal         Ductal Arch:            Appears normal
Choroid Plexus:        Previously seen        Diaphragm:              Appears normal
Cerebellum:            Previously seen        Stomach:                Appears normal, left
sided
Posterior Fossa:       Previously seen        Abdomen:                Appears normal
Nuchal Fold:           Not applicable (>20    Abdominal Wall:         Previously seen
wks GA)
Face:                  Orbits and profile     Cord Vessels:           Previously seen
previously seen
Lips:                  Previously seen        Kidneys:                Appear normal
Palate:                Previously seen        Bladder:                Appears normal
Thoracic:              Appears normal         Spine:                  Appears normal
Heart:                 Appears normal         Upper Extremities:      Previously seen
(4CH, axis, and
situs)
RVOT:                  Appears normal         Lower Extremities:      Previously seen

Other:  Female gender. Heels and 5th digit previously visualized. Technically
difficult due to fetal position.
Cervix Uterus Adnexa

Cervix
Length:            3.1  cm.
Normal appearance by transabdominal scan.

Left Ovary
Not visualized.

Right Ovary
Within normal limits.

Adnexa:       No abnormality visualized.
Impression

Single IUP at 25w 1d
Normal interval anatomy - the anatomic survey is now
complete
Fetal growth is appropriate (46th %tile)
Posterior placenta without previa
Normal amniotic fluid volume
Recommendations

Follow-up ultrasounds as clinically indicated.

## 2017-05-06 ENCOUNTER — Ambulatory Visit: Payer: Self-pay | Admitting: Family Medicine

## 2017-07-18 IMAGING — CR DG ABDOMEN 2V
2 series · 2 of 2 positions shown · non-contrast
Comparison: None.

CLINICAL DATA: Status post C-section on 09/05/2016, abdominal pain
and vomiting

EXAM:
ABDOMEN - 2 VIEW

[abdomen erect]
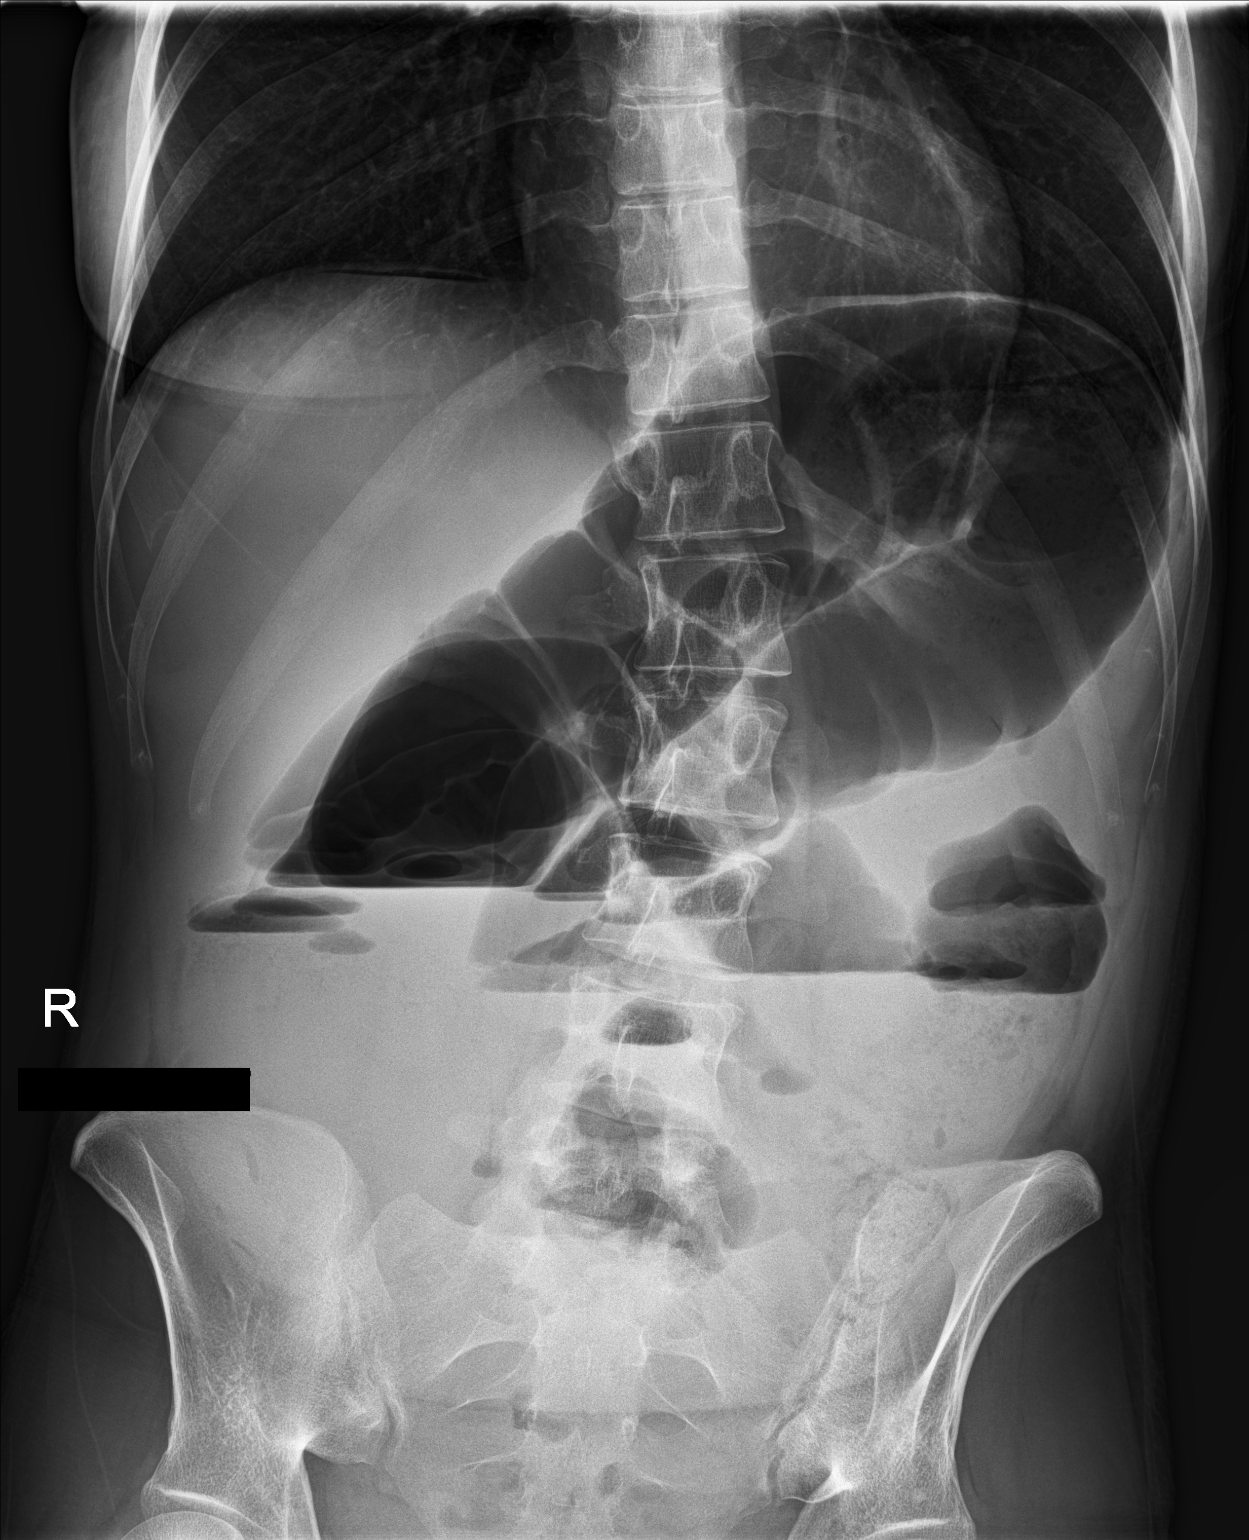

[abdomen supine]
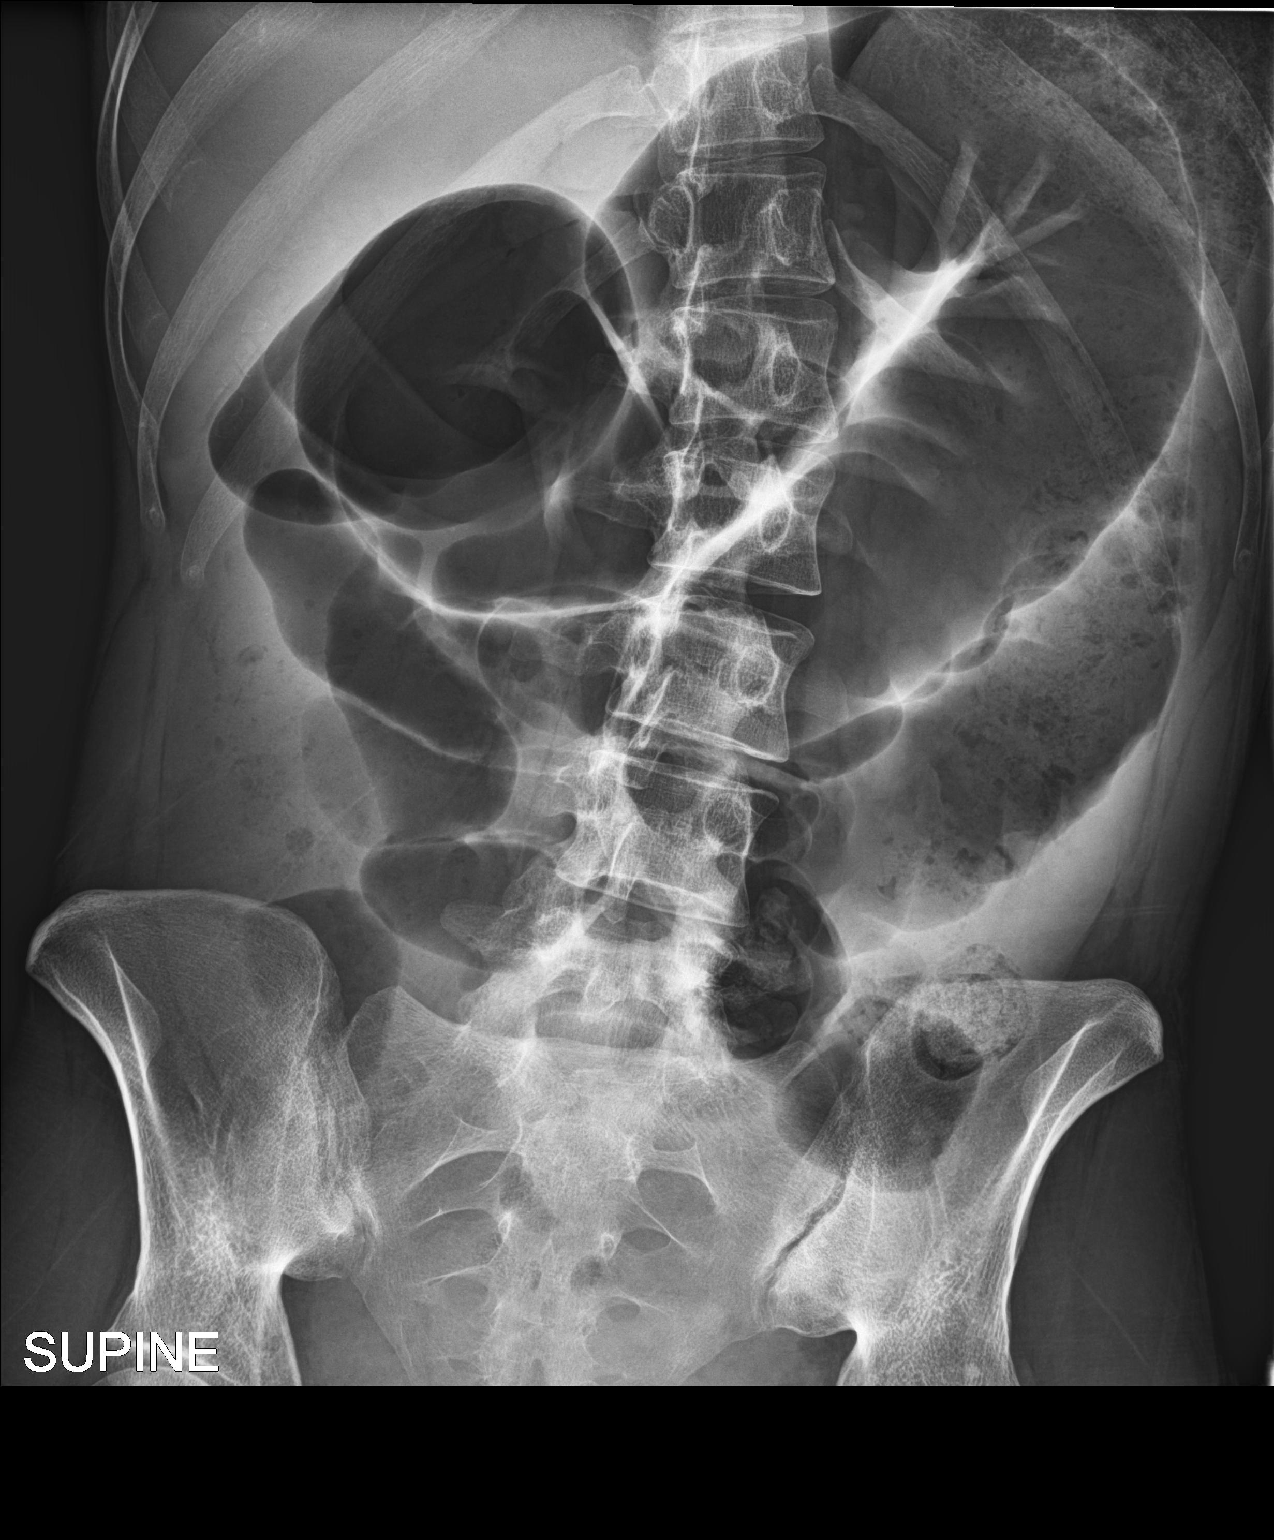

[2 of 2 positions shown; findings below may reference images not displayed]

FINDINGS: Minimal free air is noted although this is likely related to the
patient's recent Cesarean section. Moderate gaseous distension of
the colon is seen with fecal material in the left colon. Multiple
air-fluid levels are seen an these changes likely represent diffuse
colonic ileus. Correlation with the physical exam is recommended.
Mild scoliosis concave to the right is noted at the L1-2 level. No
other focal abnormality is seen.
IMPRESSION: Minimal free air likely related to the patient's recent Cesarean
section.

Changes most likely related to diffuse colonic ileus. Correlation
with the physical exam is recommended.

These results will be called to the ordering clinician or
representative by the Radiologist Assistant, and communication
documented in the PACS or zVision Dashboard.

## 2017-09-10 ENCOUNTER — Encounter: Payer: Self-pay | Admitting: *Deleted

## 2017-09-10 ENCOUNTER — Emergency Department
Admission: EM | Admit: 2017-09-10 | Discharge: 2017-09-10 | Disposition: A | Discharge status: Home / Self Care | Payer: Medicaid Other | Attending: Emergency Medicine | Admitting: Emergency Medicine

## 2017-09-10 DIAGNOSIS — L03116 Cellulitis of left lower limb: Secondary | ICD-10-CM | POA: Diagnosis not present

## 2017-09-10 DIAGNOSIS — R2242 Localized swelling, mass and lump, left lower limb: Secondary | ICD-10-CM | POA: Diagnosis present

## 2017-09-10 DIAGNOSIS — Z7982 Long term (current) use of aspirin: Secondary | ICD-10-CM | POA: Insufficient documentation

## 2017-09-10 DIAGNOSIS — Z79899 Other long term (current) drug therapy: Secondary | ICD-10-CM | POA: Diagnosis not present

## 2017-09-10 DIAGNOSIS — J45909 Unspecified asthma, uncomplicated: Secondary | ICD-10-CM | POA: Insufficient documentation

## 2017-09-10 DIAGNOSIS — F17210 Nicotine dependence, cigarettes, uncomplicated: Secondary | ICD-10-CM | POA: Insufficient documentation

## 2017-09-10 DIAGNOSIS — L0390 Cellulitis, unspecified: Secondary | ICD-10-CM

## 2017-09-10 MED ORDER — CLINDAMYCIN PHOSPHATE 600 MG/50ML IV SOLN
600.0000 mg | Freq: Once | INTRAVENOUS | Status: AC
  Administered 2017-09-10: 600 mg via INTRAVENOUS
  Filled 2017-09-10: qty 50

## 2017-09-10 MED ORDER — CLINDAMYCIN HCL 300 MG PO CAPS: 300.0000 mg | ORAL_CAPSULE | Freq: Four times a day (QID) | ORAL | 0 refills | Status: AC

## 2017-09-10 MED ORDER — IBUPROFEN 800 MG PO TABS: 800.0000 mg | ORAL_TABLET | Freq: Three times a day (TID) | ORAL | 0 refills | Status: DC | PRN

## 2017-09-10 MED ORDER — HYDROCODONE-ACETAMINOPHEN 5-325 MG PO TABS
1.0000 | ORAL_TABLET | Freq: Once | ORAL | Status: AC
  Administered 2017-09-10: 1 via ORAL
  Filled 2017-09-10: qty 1

## 2017-09-10 NOTE — ED Notes (Signed)
Waiting on the infusion to be complete to discharge Pt.

## 2017-09-10 NOTE — ED Provider Notes (Signed)
[facility] Emergency Department Provider Note   ____________________________________________   I have reviewed the triage vital signs and the nursing notes.   HISTORY  Chief Complaint Insect Bite    HPI Kim [name] is a 27 y.o. female presents to the emergency department with wound along anterior left knee and posterior thigh that developed 1-2 days ago. Patient noted progressive pain, swelling and erythema with mild drainage from the wound located at the left posterior thigh earlier today. Patient reports history of abscesses with treatments to include antibiotics and incision with drainage. Patient is unsure if she was bitten by an insect or if hair follicles have become infected. Patient reports taking ibuprofen for pain without relief. Patient states it is been over a year since the last time she had treatment for an abscess. Patient denies fever, chills, headache, vision changes, chest pain, chest tightness, shortness of breath, abdominal pain, nausea and vomiting.  Past Medical History:  Diagnosis Date  . Anemia   . Anxiety   . Asthma   . Chest pain   . Depression   . Fibromyalgia   . Gestational diabetes 2010, 2012  . Heartburn during pregnancy   . Rectal bleeding   . Seizures (HCC) 2015   x 1- unknown reason- passed out  . Ulcer     Patient Active Problem List   Diagnosis Date Noted  . Ileus, postoperative (HCC) 09/10/2016  . Status post tubal ligation 09/10/2016  . Status post repeat low transverse cesarean section 09/05/2016  . Anemia of mother in pregnancy, antepartum 06/29/2016  . GBS bacteriuria 04/06/2016  . Asthma affecting pregnancy, antepartum 02/08/2016  . Short interval between pregnancies affecting pregnancy, antepartum 02/08/2016  . Previous cesarean section complicating pregnancy, antepartum condition or complication 12/14/2014  . H/O pre-eclampsia in prior pregnancy, currently pregnant 12/14/2014  . Asthma, moderate  persistent, well-controlled 12/14/2014  . Tobacco smoking affecting pregnancy, antepartum 12/14/2014  . Bipolar depression (HCC) 12/14/2014  . Supervision of normal pregnancy 11/16/2014  . History of diabetes as a child and gestational diabetes 11/16/2014  . Underweight 11/16/2014    Past Surgical History:  Procedure Laterality Date  . CESAREAN SECTION     x 2  . CESAREAN SECTION WITH BILATERAL TUBAL LIGATION Bilateral 07/03/2015   Procedure: REPEAT CESAREAN SECTION ;  Surgeon: Tereso Newcomer, MD;  Location: [facility];  Service: Obstetrics;  Laterality: Bilateral;  . CESAREAN SECTION WITH BILATERAL TUBAL LIGATION Bilateral 09/05/2016   Procedure: REPEAT CESAREAN SECTION WITH BILATERAL TUBAL LIGATION;  Surgeon: Ely Bing, MD;  Location: [facility];  Service: Obstetrics;  Laterality: Bilateral;  . COLONOSCOPY  04/16/2008  . WISDOM TOOTH EXTRACTION      Prior to Admission medications   Medication Sig Start Date End Date Taking? Authorizing Provider  acetaminophen (TYLENOL) 500 MG tablet Take 1,000 mg by mouth every 6 (six) hours as needed for mild pain or headache.    [provider]  albuterol (PROVENTIL HFA;VENTOLIN HFA) 108 (90 BASE) MCG/ACT inhaler Inhale 2 puffs into the lungs every 6 (six) hours as needed for wheezing or shortness of breath. Patient not taking: Reported on 08/30/2016 12/14/14   Reva Bores, MD  ALPRAZolam Prudy Feeler) 0.5 MG tablet Take 1 tablet (0.5 mg total) by mouth at bedtime as needed for anxiety. Take once a day by mouth as needed 06/27/16   Willodean Rosenthal, MD  aspirin 81 MG chewable tablet Chew 1 tablet (81 mg total) by mouth daily. 03/07/16  Reva Bores, MD  beclomethasone (QVAR) 40 MCG/ACT inhaler Inhale 2 puffs into the lungs 2 (two) times daily. Patient not taking: Reported on 08/30/2016 03/07/16   Reva Bores, MD  calcium carbonate (TUMS - DOSED IN MG ELEMENTAL CALCIUM) 500 MG chewable tablet Chew 2 tablets by mouth 3 (three) times  daily as needed for indigestion or heartburn.    [provider]  clindamycin (CLEOCIN) 300 MG capsule Take 1 capsule (300 mg total) by mouth 4 (four) times daily. 09/10/17 09/20/17  Landry Lookingbill M, PA-C  diphenhydrAMINE-zinc acetate (BENADRYL) cream Apply 1 application topically daily as needed for itching.    [provider]  ibuprofen (ADVIL,MOTRIN) 800 MG tablet Take 1 tablet (800 mg total) by mouth every 8 (eight) hours as needed. 09/10/17   Zuhair Lariccia M, PA-C  ondansetron (ZOFRAN ODT) 4 MG disintegrating tablet Take 1 tablet (4 mg total) by mouth every 8 (eight) hours as needed for nausea or vomiting. 12/03/16   Tommi Rumps, PA-C  oxyCODONE (OXY IR/ROXICODONE) 5 MG immediate release tablet Take 1-2 tablets (5-10 mg total) by mouth every 4 (four) hours as needed for moderate pain. 09/10/16   Leland Her, DO  oxyCODONE-acetaminophen (PERCOCET) 5-325 MG tablet Take 1 tablet by mouth every 4 (four) hours as needed for severe pain. 12/03/16   Tommi Rumps, PA-C  sulfamethoxazole-trimethoprim (BACTRIM DS,SEPTRA DS) 800-160 MG tablet Take 1 tablet by mouth 2 (two) times daily. 12/03/16   Tommi Rumps, PA-C  traMADol (ULTRAM) 50 MG tablet Take 1 tablet (50 mg total) by mouth every 6 (six) hours as needed. 03/12/17 03/12/18  Jene Every, MD    Allergies Patient has no known allergies.  Family History  Problem Relation Age of Onset  . Depression Mother   . Fibromyalgia Mother   . Cancer Mother        breast  . Diabetes Maternal Grandmother   . Hypertension Maternal Grandmother   . Arthritis Maternal Grandmother   . Thyroid disease Maternal Grandmother     Social History Social History  Substance Use Topics  . Smoking status: Current Some Day Smoker    Packs/day: 0.50    Types: Cigarettes  . Smokeless tobacco: Former Neurosurgeon    Quit date: 08/24/2014  . Alcohol use No    Review of Systems Constitutional: Negative for fever/chills Eyes: No visual  changes. ENT:  Negative for sore throat and for difficulty swallowing Cardiovascular: Denies chest pain. Respiratory: Denies cough. Denies shortness of breath. Gastrointestinal: No abdominal pain.  No nausea, vomiting, diarrhea. Genitourinary: Negative for dysuria. Musculoskeletal: Positive for right knee pain secondary to anterior left knee skin infection. Skin: Negative for rash. Anterior left knee wound and posterior left thigh wound area with pain erythema and swelling with induration. Neurological: Negative for headaches. Able to ambulate. ____________________________________________   PHYSICAL EXAM:  VITAL SIGNS: ED Triage Vitals  Enc Vitals Group     BP 09/10/17 1847 106/68     Pulse Rate 09/10/17 1844 90     Resp 09/10/17 1844 16     Temp 09/10/17 1844 98.6 F (37 C)     Temp Source 09/10/17 1844 Oral     SpO2 09/10/17 1844 99 %     Weight 09/10/17 1844 103 lb (46.7 kg)     Height 09/10/17 1844  (1.727 m)     Head Circumference --      Peak Flow --      Pain Score 09/10/17 1844  7     Pain Loc --      Pain Edu? --      Excl. in GC? --     Constitutional: Alert and oriented. Well appearing and in no acute distress.  Eyes: Conjunctivae are normal. PERRL. EOMI  Head: Normocephalic and atraumatic. ENT:      Ears: Canals clear. TMs intact bilaterally.      Nose: No congestion/rhinnorhea.      Mouth/Throat: Mucous membranes are moist. Neck:Supple. No thyromegaly. No stridor.  Cardiovascular: Normal rate, regular rhythm. Good peripheral circulation. Respiratory: Normal respiratory effort without tachypnea or retractions. Lungs CTAB. No wheezes/rales/rhonchi.  Hematological/Lymphatic/Immunological: No cervical lymphadenopathy. Cardiovascular: Normal rate, regular rhythm. Normal distal pulses. Gastrointestinal: Bowel sounds 4 quadrants. Soft and nontender to palpation. No CVA tenderness. Musculoskeletal: Left knee pain that increases with range of motion secondary  to left knee skin infection. No joint effusion noted. Intact strength and sensation of the left lower extremity. Neurologic: Normal speech and language.  No sensory loss or abnormal reflexes.  Skin:  Skin is warm, dry and intact. No rash noted. Anterior left knee wound area with approximately 5 cm diameter area of erythema and 3 cm diameter area of swelling with mild induration. Posterior left thigh wound area with same, approximately 4 cm diameter area of erythema and 2 cm diameter area of swelling with induration. No drainage noted during assessment. Psychiatric: Mood and affect are normal. Speech and behavior are normal. Patient exhibits appropriate insight and judgement.  ____________________________________________   LABS (all labs ordered are listed, but only abnormal results are displayed)  Labs Reviewed - No data to display ____________________________________________  EKG none ____________________________________________  RADIOLOGY none ____________________________________________   PROCEDURES  Procedure(s) performed: no    Critical Care performed: no ____________________________________________   INITIAL IMPRESSION / ASSESSMENT AND PLAN / ED COURSE  Pertinent labs & imaging results that were available during my care of the patient were reviewed by me and considered in my medical decision making (see chart for details).  Patient presents to emergency department with 2 wound areas with pain, erythema and swelling. History and physical exam findings are reassuring symptoms are consistent with staph skin infection. Antibody initiated with clindamycin 600 mg IV during the course of care in the emergency department. Patient will be prescribed clindamycin for continued antibody coverage. Patient advised to follow up with PCP as needed or return to the emergency department if symptoms return or worsen. Patient informed of clinical course, understand medical decision-making  process, and agree with plan.     ____________________________________________   FINAL CLINICAL IMPRESSION(S) / ED DIAGNOSES  Final diagnoses:  Cellulitis, unspecified cellulitis site       NEW MEDICATIONS STARTED DURING THIS VISIT:  New Prescriptions   CLINDAMYCIN (CLEOCIN) 300 MG CAPSULE    Take 1 capsule (300 mg total) by mouth 4 (four) times daily.   IBUPROFEN (ADVIL,MOTRIN) 800 MG TABLET    Take 1 tablet (800 mg total) by mouth every 8 (eight) hours as needed.     Note:  This document was prepared using Dragon voice recognition software and may include unintentional dictation errors.    Percell BostonLittle, Jazzmine Kleiman M, PA-C 09/10/17 2005    Jeanmarie PlantMcShane, James A, MD 09/10/17 2242

## 2017-09-10 NOTE — ED Triage Notes (Signed)
Pt to ED via EMS reporting two bites that pt noticed last night. Pt reports she has had a lot of abscesses lately and was scared it was a bug or more abscesses. One is located on pts left knee and is red and swollen at this time with no drainage noted. Another located on pts left hip that is draining from time to time per pt and is red as well. No fevers reports.

## 2018-01-27 DIAGNOSIS — Z5321 Procedure and treatment not carried out due to patient leaving prior to being seen by health care provider: Secondary | ICD-10-CM | POA: Diagnosis not present

## 2018-01-27 DIAGNOSIS — K625 Hemorrhage of anus and rectum: Secondary | ICD-10-CM | POA: Insufficient documentation

## 2018-01-27 LAB — CBC WITH DIFFERENTIAL/PLATELET
Basophils Absolute: 0.1 10*3/uL (ref 0–0.1)
Basophils Relative: 1 %
EOS ABS: 0.3 10*3/uL (ref 0–0.7)
EOS PCT: 3 %
HCT: 37 % (ref 35.0–47.0)
Hemoglobin: 12.4 g/dL (ref 12.0–16.0)
LYMPHS ABS: 2 10*3/uL (ref 1.0–3.6)
LYMPHS PCT: 24 %
MCH: 29.6 pg (ref 26.0–34.0)
MCHC: 33.5 g/dL (ref 32.0–36.0)
MCV: 88.4 fL (ref 80.0–100.0)
MONO ABS: 0.6 10*3/uL (ref 0.2–0.9)
Monocytes Relative: 8 %
Neutro Abs: 5.3 10*3/uL (ref 1.4–6.5)
Neutrophils Relative %: 64 %
Platelets: 205 10*3/uL (ref 150–440)
RBC: 4.19 MIL/uL (ref 3.80–5.20)
RDW: 14.4 % (ref 11.5–14.5)
WBC: 8.2 10*3/uL (ref 3.6–11.0)

## 2018-01-27 LAB — COMPREHENSIVE METABOLIC PANEL
ALK PHOS: 68 U/L (ref 38–126)
ALT: 10 U/L — ABNORMAL LOW (ref 14–54)
ANION GAP: 9 (ref 5–15)
AST: 20 U/L (ref 15–41)
Albumin: 4.2 g/dL (ref 3.5–5.0)
BILIRUBIN TOTAL: 0.5 mg/dL (ref 0.3–1.2)
BUN: 6 mg/dL (ref 6–20)
CALCIUM: 8.8 mg/dL — AB (ref 8.9–10.3)
CO2: 26 mmol/L (ref 22–32)
Chloride: 103 mmol/L (ref 101–111)
Creatinine, Ser: 0.64 mg/dL (ref 0.44–1.00)
GFR calc Af Amer: >60 mL/min (ref >60)
Glucose, Bld: 112 mg/dL — ABNORMAL HIGH (ref 65–99)
POTASSIUM: 2.9 mmol/L — AB (ref 3.5–5.1)
Sodium: 138 mmol/L (ref 135–145)
TOTAL PROTEIN: 7 g/dL (ref 6.5–8.1)

## 2018-01-27 LAB — TYPE AND SCREEN
ABO/RH(D): O POS
Antibody Screen: NEGATIVE

## 2018-01-27 NOTE — ED Triage Notes (Signed)
Patient c/o rectal bleeding, emesis and weakness. Patient reports hx of "ulcers in colon".

## 2018-01-28 ENCOUNTER — Telehealth: Payer: Self-pay | Admitting: Emergency Medicine

## 2018-01-28 ENCOUNTER — Emergency Department
Admission: EM | Admit: 2018-01-28 | Discharge: 2018-01-28 | Disposition: A | Discharge status: Home / Self Care | Payer: Medicaid Other | Attending: Emergency Medicine | Admitting: Emergency Medicine

## 2018-01-28 NOTE — ED Notes (Signed)
No answer when called several times from lobby 

## 2018-01-28 NOTE — Telephone Encounter (Signed)
Called patient due to lwot to inquire about condition and follow up plans. Home and mobile numbers are out of service.

## 2018-01-28 NOTE — ED Notes (Addendum)
No answer when called several times from lobby 

## 2018-06-10 ENCOUNTER — Emergency Department
Admission: EM | Admit: 2018-06-10 | Discharge: 2018-06-10 | Disposition: A | Discharge status: Home / Self Care | Payer: Self-pay | Attending: Emergency Medicine | Admitting: Emergency Medicine

## 2018-06-10 ENCOUNTER — Emergency Department: Payer: Self-pay

## 2018-06-10 ENCOUNTER — Encounter: Payer: Self-pay | Admitting: Emergency Medicine

## 2018-06-10 DIAGNOSIS — Y929 Unspecified place or not applicable: Secondary | ICD-10-CM | POA: Insufficient documentation

## 2018-06-10 DIAGNOSIS — J45909 Unspecified asthma, uncomplicated: Secondary | ICD-10-CM | POA: Insufficient documentation

## 2018-06-10 DIAGNOSIS — S300XXA Contusion of lower back and pelvis, initial encounter: Secondary | ICD-10-CM | POA: Insufficient documentation

## 2018-06-10 DIAGNOSIS — F17210 Nicotine dependence, cigarettes, uncomplicated: Secondary | ICD-10-CM | POA: Insufficient documentation

## 2018-06-10 DIAGNOSIS — Y999 Unspecified external cause status: Secondary | ICD-10-CM | POA: Insufficient documentation

## 2018-06-10 DIAGNOSIS — W07XXXA Fall from chair, initial encounter: Secondary | ICD-10-CM | POA: Insufficient documentation

## 2018-06-10 DIAGNOSIS — Z7982 Long term (current) use of aspirin: Secondary | ICD-10-CM | POA: Insufficient documentation

## 2018-06-10 DIAGNOSIS — Y9389 Activity, other specified: Secondary | ICD-10-CM | POA: Insufficient documentation

## 2018-06-10 DIAGNOSIS — Z79899 Other long term (current) drug therapy: Secondary | ICD-10-CM | POA: Insufficient documentation

## 2018-06-10 MED ORDER — CYCLOBENZAPRINE HCL 5 MG PO TABS: 5.0000 mg | ORAL_TABLET | Freq: Three times a day (TID) | ORAL | 0 refills | Status: DC | PRN

## 2018-06-10 MED ORDER — CYCLOBENZAPRINE HCL 10 MG PO TABS
5.0000 mg | ORAL_TABLET | Freq: Once | ORAL | Status: AC
  Administered 2018-06-10: 5 mg via ORAL
  Filled 2018-06-10: qty 1

## 2018-06-10 NOTE — Discharge Instructions (Signed)
Your exam and x-rays are negative for any fracture or dislocation. Take OTC Tylenol and Motrin, along with the muscle relaxant for pain relief. Apply ice to reduce pain and swelling. Follow-up with [facility] for continued symptoms and routine medical care.

## 2018-06-10 NOTE — ED Provider Notes (Signed)
[facility] Emergency Department Provider Note ____________________________________________  Time seen: 1024  I have reviewed the triage vital signs and the nursing notes.  HISTORY  Chief Complaint  Back Pain and Hip Pain  HPI Kim [name] is a 28 y.o. female resents to the ED for evaluation of tailbone pain after mechanical fall yesterday.  Patient claims that she fell or sat abruptly on the wooden arm of her couch, and noted severe pain to her tailbone and right hip.  Patient with a remote history of a coccyx fracture, presents with exquisite tenderness to the coccyx.  She denies any other injury at this time patient denies any difficulty with passing urine or stool.  She is taken Tylenol at home but denies any significant benefit.  She is here for further evaluation.  Past Medical History:  Diagnosis Date  . Anemia   . Anxiety   . Asthma   . Chest pain   . Depression   . Fibromyalgia   . Gestational diabetes 2010, 2012  . Heartburn during pregnancy   . Rectal bleeding   . Seizures (HCC) 2015   x 1- unknown reason- passed out  . Ulcer     Patient Active Problem List   Diagnosis Date Noted  . Ileus, postoperative (HCC) 09/10/2016  . Status post tubal ligation 09/10/2016  . Status post repeat low transverse cesarean section 09/05/2016  . Anemia of mother in pregnancy, antepartum 06/29/2016  . GBS bacteriuria 04/06/2016  . Asthma affecting pregnancy, antepartum 02/08/2016  . Short interval between pregnancies affecting pregnancy, antepartum 02/08/2016  . Previous cesarean section complicating pregnancy, antepartum condition or complication 12/14/2014  . H/O pre-eclampsia in prior pregnancy, currently pregnant 12/14/2014  . Asthma, moderate persistent, well-controlled 12/14/2014  . Tobacco smoking affecting pregnancy, antepartum 12/14/2014  . Bipolar depression (HCC) 12/14/2014  . Supervision of normal pregnancy 11/16/2014  . History of diabetes as  a child and gestational diabetes 11/16/2014  . Underweight 11/16/2014    Past Surgical History:  Procedure Laterality Date  . CESAREAN SECTION     x 2  . CESAREAN SECTION WITH BILATERAL TUBAL LIGATION Bilateral 07/03/2015   Procedure: REPEAT CESAREAN SECTION ;  Surgeon: Tereso NewcomerUgonna A Anyanwu, MD;  Location: [facility];  Service: Obstetrics;  Laterality: Bilateral;  . CESAREAN SECTION WITH BILATERAL TUBAL LIGATION Bilateral 09/05/2016   Procedure: REPEAT CESAREAN SECTION WITH BILATERAL TUBAL LIGATION;  Surgeon: Anton Ruiz Bingharlie Pickens, MD;  Location: [facility];  Service: Obstetrics;  Laterality: Bilateral;  . COLONOSCOPY  04/16/2008  . WISDOM TOOTH EXTRACTION      Prior to Admission medications   Medication Sig Start Date End Date Taking? Authorizing Provider  acetaminophen (TYLENOL) 500 MG tablet Take 1,000 mg by mouth every 6 (six) hours as needed for mild pain or headache.    [provider]  ALPRAZolam Prudy Feeler(XANAX) 0.5 MG tablet Take 1 tablet (0.5 mg total) by mouth at bedtime as needed for anxiety. Take once a day by mouth as needed 06/27/16   Willodean RosenthalHarraway-Smith, Carolyn, MD  aspirin 81 MG chewable tablet Chew 1 tablet (81 mg total) by mouth daily. 03/07/16   Reva BoresPratt, Tanya S, MD  beclomethasone (QVAR) 40 MCG/ACT inhaler Inhale 2 puffs into the lungs 2 (two) times daily. Patient not taking: Reported on 08/30/2016 03/07/16   Reva BoresPratt, Tanya S, MD  calcium carbonate (TUMS - DOSED IN MG ELEMENTAL CALCIUM) 500 MG chewable tablet Chew 2 tablets by mouth 3 (three) times daily as needed for indigestion or heartburn.  [provider]  cyclobenzaprine (FLEXERIL) 5 MG tablet Take 1 tablet (5 mg total) by mouth 3 (three) times daily as needed for muscle spasms. 06/10/18   Feige Lowdermilk, Charlesetta Ivory, PA-C  diphenhydrAMINE-zinc acetate (BENADRYL) cream Apply 1 application topically daily as needed for itching.    [provider]  ondansetron (ZOFRAN ODT) 4 MG disintegrating tablet Take 1 tablet (4 mg  total) by mouth every 8 (eight) hours as needed for nausea or vomiting. 12/03/16   Tommi Rumps, PA-C    Allergies Patient has no known allergies.  Family History  Problem Relation Age of Onset  . Depression Mother   . Fibromyalgia Mother   . Cancer Mother        breast  . Diabetes Maternal Grandmother   . Hypertension Maternal Grandmother   . Arthritis Maternal Grandmother   . Thyroid disease Maternal Grandmother     Social History Social History   Tobacco Use  . Smoking status: Current Every Day Smoker    Packs/day: 0.50    Types: Cigarettes  . Smokeless tobacco: Former Neurosurgeon    Quit date: 08/24/2014  Substance Use Topics  . Alcohol use: No  . Drug use: Yes    Types: Marijuana    Comment: not while pregnant    Review of Systems  Constitutional: Negative for fever. Cardiovascular: Negative for chest pain. Respiratory: Negative for shortness of breath. Musculoskeletal: Negative for back pain.  Reports tailbone pain as above. Skin: Negative for rash. Neurological: Negative for headaches, focal weakness or numbness. ____________________________________________  PHYSICAL EXAM:  VITAL SIGNS: ED Triage Vitals  Enc Vitals Group     BP 06/10/18 0946 118/70     Pulse Rate 06/10/18 0946 90     Resp 06/10/18 0946 18     Temp 06/10/18 0946 98 F (36.7 C)     Temp Source 06/10/18 0946 Oral     SpO2 06/10/18 0946 100 %     Weight 06/10/18 0942 103 lb (46.7 kg)     Height 06/10/18 0942 5\' 8"  (1.727 m)     Head Circumference --      Peak Flow --      Pain Score 06/10/18 0942 10     Pain Loc --      Pain Edu? --      Excl. in GC? --     Constitutional: Alert and oriented. Well appearing and in no distress. Head: Normocephalic and atraumatic. Cardiovascular: Normal rate, regular rhythm. Normal distal pulses. Respiratory: Normal respiratory effort. No wheezes/rales/rhonchi. Musculoskeletal: No spinal alignment without midline tenderness, spasm, deformity,  step-off.  Patient with a chronic deformity noted to her coccyx at the tailbone.  She is tender to palpation over the central superior gluteal cleft.  No bruising, swelling, ecchymosis, or edema is appreciated.  She with normal extension flexion range of the hips bilaterally.  Nontender with normal range of motion in all extremities.  Neurologic: Nerves II through XII grossly intact.  Normal LE DTRs bilaterally.  Normal gait without ataxia. Normal speech and language. No gross focal neurologic deficits are appreciated. Skin:  Skin is warm, dry and intact. No rash noted. ____________________________________________   RADIOLOGY  Sacrum/Coccyx Negative  ____________________________________________  PROCEDURES  Procedures Flexeril 5 mg PO ____________________________________________  INITIAL IMPRESSION / ASSESSMENT AND PLAN / ED COURSE  Patient of pain to the tailbone after a fall in the arm of her couch.  Patient's exam is overall benign and her x-ray is reassuring as it shows  no acute fracture or dislocation.  Patient will be discharged with a prescription for Flexeril dose in addition to over-the-counter Tylenol or Motrin.  She is referred to local community clinic for ongoing symptom management. ____________________________________________  FINAL CLINICAL IMPRESSION(S) / ED DIAGNOSES  Final diagnoses:  Coccyx contusion, initial encounter      Lissa Hoard, PA-C 06/10/18 1833    Minna Antis, MD 06/11/18 [phone]

## 2018-06-10 NOTE — ED Triage Notes (Signed)
Pt reports hx of back issues and was told to be careful. Pt reports sat on the wooden arm of a couch hard last pm and now with severe pain to her lower back and right hip.

## 2018-06-10 NOTE — ED Notes (Signed)
See triage note  Presents with pain to tailbone  Stats she fell  Hitting the arm of the couch  Swelling noted to coccyx  Ambulates slowly d/t pain

## 2018-07-07 ENCOUNTER — Other Ambulatory Visit: Payer: Self-pay

## 2018-07-07 ENCOUNTER — Emergency Department
Admission: EM | Admit: 2018-07-07 | Discharge: 2018-07-07 | Disposition: A | Discharge status: Other Acute Inpatient Hospital | Payer: Self-pay | Attending: Emergency Medicine | Admitting: Emergency Medicine

## 2018-07-07 ENCOUNTER — Encounter (HOSPITAL_COMMUNITY): Payer: Self-pay

## 2018-07-07 ENCOUNTER — Encounter: Payer: Self-pay | Admitting: *Deleted

## 2018-07-07 ENCOUNTER — Inpatient Hospital Stay (HOSPITAL_COMMUNITY)
Admission: AD | Admit: 2018-07-07 | Discharge: 2018-07-11 | DRG: 885 | Disposition: A | Discharge status: Home / Self Care | Payer: Federal, State, Local not specified - Other | Source: Intra-hospital | Attending: Psychiatry | Admitting: Psychiatry

## 2018-07-07 DIAGNOSIS — F17210 Nicotine dependence, cigarettes, uncomplicated: Secondary | ICD-10-CM | POA: Insufficient documentation

## 2018-07-07 DIAGNOSIS — G4700 Insomnia, unspecified: Secondary | ICD-10-CM | POA: Diagnosis present

## 2018-07-07 DIAGNOSIS — Z8261 Family history of arthritis: Secondary | ICD-10-CM

## 2018-07-07 DIAGNOSIS — M797 Fibromyalgia: Secondary | ICD-10-CM | POA: Diagnosis present

## 2018-07-07 DIAGNOSIS — F4310 Post-traumatic stress disorder, unspecified: Secondary | ICD-10-CM | POA: Diagnosis present

## 2018-07-07 DIAGNOSIS — Z818 Family history of other mental and behavioral disorders: Secondary | ICD-10-CM | POA: Diagnosis not present

## 2018-07-07 DIAGNOSIS — Z630 Problems in relationship with spouse or partner: Secondary | ICD-10-CM | POA: Diagnosis not present

## 2018-07-07 DIAGNOSIS — Z8632 Personal history of gestational diabetes: Secondary | ICD-10-CM | POA: Diagnosis not present

## 2018-07-07 DIAGNOSIS — F1290 Cannabis use, unspecified, uncomplicated: Secondary | ICD-10-CM | POA: Diagnosis not present

## 2018-07-07 DIAGNOSIS — Z7982 Long term (current) use of aspirin: Secondary | ICD-10-CM | POA: Diagnosis not present

## 2018-07-07 DIAGNOSIS — Z8249 Family history of ischemic heart disease and other diseases of the circulatory system: Secondary | ICD-10-CM

## 2018-07-07 DIAGNOSIS — R45851 Suicidal ideations: Secondary | ICD-10-CM | POA: Insufficient documentation

## 2018-07-07 DIAGNOSIS — F329 Major depressive disorder, single episode, unspecified: Secondary | ICD-10-CM

## 2018-07-07 DIAGNOSIS — J45909 Unspecified asthma, uncomplicated: Secondary | ICD-10-CM | POA: Insufficient documentation

## 2018-07-07 DIAGNOSIS — Z803 Family history of malignant neoplasm of breast: Secondary | ICD-10-CM | POA: Diagnosis not present

## 2018-07-07 DIAGNOSIS — Z833 Family history of diabetes mellitus: Secondary | ICD-10-CM

## 2018-07-07 DIAGNOSIS — F332 Major depressive disorder, recurrent severe without psychotic features: Principal | ICD-10-CM

## 2018-07-07 DIAGNOSIS — F1210 Cannabis abuse, uncomplicated: Secondary | ICD-10-CM

## 2018-07-07 DIAGNOSIS — F410 Panic disorder [episodic paroxysmal anxiety] without agoraphobia: Secondary | ICD-10-CM | POA: Diagnosis not present

## 2018-07-07 DIAGNOSIS — Z62810 Personal history of physical and sexual abuse in childhood: Secondary | ICD-10-CM | POA: Diagnosis present

## 2018-07-07 DIAGNOSIS — F319 Bipolar disorder, unspecified: Secondary | ICD-10-CM | POA: Insufficient documentation

## 2018-07-07 DIAGNOSIS — Z79899 Other long term (current) drug therapy: Secondary | ICD-10-CM | POA: Insufficient documentation

## 2018-07-07 DIAGNOSIS — T1491XA Suicide attempt, initial encounter: Secondary | ICD-10-CM

## 2018-07-07 LAB — URINE DRUG SCREEN, QUALITATIVE (ARMC ONLY)
AMPHETAMINES, UR SCREEN: NOT DETECTED
Benzodiazepine, Ur Scrn: NOT DETECTED
Cannabinoid 50 Ng, Ur ~~LOC~~: POSITIVE — AB
Cocaine Metabolite,Ur ~~LOC~~: NOT DETECTED
MDMA (ECSTASY) UR SCREEN: NOT DETECTED
Methadone Scn, Ur: NOT DETECTED
Opiate, Ur Screen: NOT DETECTED
Phencyclidine (PCP) Ur S: NOT DETECTED
TRICYCLIC, UR SCREEN: NOT DETECTED

## 2018-07-07 LAB — COMPREHENSIVE METABOLIC PANEL
ALBUMIN: 4.3 g/dL (ref 3.5–5.0)
ALK PHOS: 49 U/L (ref 38–126)
ALT: 10 U/L (ref 0–44)
ANION GAP: 6 (ref 5–15)
AST: 19 U/L (ref 15–41)
BUN: 8 mg/dL (ref 6–20)
CALCIUM: 8.9 mg/dL (ref 8.9–10.3)
CO2: 30 mmol/L (ref 22–32)
Chloride: 105 mmol/L (ref 98–111)
Creatinine, Ser: 0.78 mg/dL (ref 0.44–1.00)
GFR calc non Af Amer: >60 mL/min (ref >60)
GLUCOSE: 98 mg/dL (ref 70–99)
Potassium: 3.1 mmol/L — ABNORMAL LOW (ref 3.5–5.1)
SODIUM: 141 mmol/L (ref 135–145)
TOTAL PROTEIN: 7 g/dL (ref 6.5–8.1)
Total Bilirubin: 0.6 mg/dL (ref 0.3–1.2)

## 2018-07-07 LAB — CBC
HCT: 35.9 % (ref 35.0–47.0)
HEMOGLOBIN: 12.5 g/dL (ref 12.0–16.0)
MCH: 30.8 pg (ref 26.0–34.0)
MCHC: 34.9 g/dL (ref 32.0–36.0)
MCV: 88.2 fL (ref 80.0–100.0)
PLATELETS: 197 10*3/uL (ref 150–440)
RBC: 4.07 MIL/uL (ref 3.80–5.20)
RDW: 14.6 % — ABNORMAL HIGH (ref 11.5–14.5)
WBC: 4.8 10*3/uL (ref 3.6–11.0)

## 2018-07-07 LAB — ETHANOL: Alcohol, Ethyl (B): <10 mg/dL (ref <10)

## 2018-07-07 LAB — POC URINE PREG, ED: PREG TEST UR: NEGATIVE

## 2018-07-07 LAB — SALICYLATE LEVEL: Salicylate Lvl: <7 mg/dL (ref 2.8–30.0)

## 2018-07-07 LAB — PREGNANCY, URINE: PREG TEST UR: NEGATIVE

## 2018-07-07 LAB — CK: Total CK: 89 U/L (ref 38–234)

## 2018-07-07 LAB — ACETAMINOPHEN LEVEL

## 2018-07-07 MED ORDER — NICOTINE 21 MG/24HR TD PT24
MEDICATED_PATCH | TRANSDERMAL | Status: AC
  Filled 2018-07-07: qty 1

## 2018-07-07 MED ORDER — MAGNESIUM HYDROXIDE 400 MG/5ML PO SUSP: 30.0000 mL | Freq: Every day | ORAL | Status: DC | PRN

## 2018-07-07 MED ORDER — ACETAMINOPHEN 325 MG PO TABS
650.0000 mg | ORAL_TABLET | Freq: Four times a day (QID) | ORAL | Status: DC | PRN
  Administered 2018-07-08 – 2018-07-10 (×2): 650 mg via ORAL
  Filled 2018-07-07 (×2): qty 2

## 2018-07-07 MED ORDER — TRAZODONE HCL 50 MG PO TABS
50.0000 mg | ORAL_TABLET | Freq: Every evening | ORAL | Status: DC | PRN
  Administered 2018-07-07: 50 mg via ORAL
  Filled 2018-07-07: qty 1

## 2018-07-07 MED ORDER — HYDROXYZINE HCL 25 MG PO TABS
25.0000 mg | ORAL_TABLET | Freq: Four times a day (QID) | ORAL | Status: DC | PRN
  Administered 2018-07-07 – 2018-07-10 (×6): 25 mg via ORAL
  Filled 2018-07-07 (×4): qty 1
  Filled 2018-07-07: qty 10
  Filled 2018-07-07 (×2): qty 1

## 2018-07-07 MED ORDER — POTASSIUM CHLORIDE CRYS ER 20 MEQ PO TBCR
40.0000 meq | EXTENDED_RELEASE_TABLET | Freq: Once | ORAL | Status: AC
  Administered 2018-07-07: 40 meq via ORAL
  Filled 2018-07-07: qty 2

## 2018-07-07 MED ORDER — ENSURE ENLIVE PO LIQD: 237.0000 mL | Freq: Two times a day (BID) | ORAL | Status: DC

## 2018-07-07 MED ORDER — NICOTINE 21 MG/24HR TD PT24
21.0000 mg | MEDICATED_PATCH | Freq: Every day | TRANSDERMAL | Status: DC
  Administered 2018-07-08 – 2018-07-11 (×4): 21 mg via TRANSDERMAL
  Filled 2018-07-07 (×6): qty 1

## 2018-07-07 MED ORDER — ALUM & MAG HYDROXIDE-SIMETH 200-200-20 MG/5ML PO SUSP: 30.0000 mL | ORAL | Status: DC | PRN

## 2018-07-07 MED ORDER — NICOTINE 21 MG/24HR TD PT24
21.0000 mg | MEDICATED_PATCH | Freq: Once | TRANSDERMAL | Status: DC
  Administered 2018-07-07: 21 mg via TRANSDERMAL

## 2018-07-07 NOTE — ED Triage Notes (Signed)
Pt to ED reporting SI. PT denies having a plan but reported sitting in the car yesterday wanting to fall asleep and never wake up. PT locked the car doors but eventually unlocked the car and got out. Hx of bipolar with no medications at this time. No recent changes. NO drug use and no alcohol use reported. Pt has two small children and a boyfriend that she reported feeling safe with. Pt reports feeling depressed and unable to, "pull myself out"

## 2018-07-07 NOTE — ED Notes (Signed)

## 2018-07-07 NOTE — ED Notes (Signed)
[facility] CALLED  TO  TAKE  PT  TO  [facility]

## 2018-07-07 NOTE — BH Assessment (Signed)
[facility], pending medical clearance per EDP. Room assignment 402-2. Bed available once patient medically cleared. Patient attending will be Dr. Jola Babinskilary Patient is currently voluntary, voluntary consent to be faxed to [phone]. Number for nurse to nurse report is [phone]. Report must be called before patient is transported. [facility] to transport voluntary patients.  Discussed above with Nicole Cellaorothy Pt's primary RN who agrees to notify MD.

## 2018-07-07 NOTE — ED Notes (Signed)
Pt give all her personal belongings. Transferred to the care of [facility]. Pt educated on transfer and verbalized understanding. Pt ambulated in NAD.

## 2018-07-07 NOTE — Tx Team (Signed)
Initial Treatment Plan 07/07/2018 10:27 PM Kim CooleyJessica R [name] [id number]    PATIENT STRESSORS: Financial difficulties Marital or family conflict Medication change or noncompliance Substance abuse   PATIENT STRENGTHS: Ability for insight Wellsite geologistCommunication skills General fund of knowledge Supportive family/friends Work skills   PATIENT IDENTIFIED PROBLEMS: At risk for suicide   "Anxiety"   "Depression"   "Finances"                DISCHARGE CRITERIA:  Ability to meet basic life and health needs Improved stabilization in mood, thinking, and/or behavior Verbal commitment to aftercare and medication compliance  PRELIMINARY DISCHARGE PLAN: Outpatient therapy Participate in family therapy Return to previous living arrangement Return to previous work or school arrangements  PATIENT/FAMILY INVOLVEMENT: This treatment plan has been presented to and reviewed with the patient, Kim [name]. The patient have been given the opportunity to ask questions and make suggestions.  Tyrone AppleEmily  Brittnee Gaetano, RN 07/07/2018, 10:27 PM

## 2018-07-07 NOTE — ED Notes (Signed)
Pt using the phone prior to transfer
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
## 2018-07-07 NOTE — Progress Notes (Signed)
Admission Note:    Kim BariJessica [name] is a 28 y.o. female [facility] with suicidal ideation to lock herself in car in hopes of "passing out". [facility]. Pt was anxious but cooperative during the admission process. Pt was able to contract for safety on the unit. Pt denies HI/AVH/Pain at this time. Pt denies alcohol-use. Pt states she uses marijuana and smokes cigarettes daily. Pt states she lives in a trailer with her boyfriend and 4 kids (1,3,7, and 9). Pt endorses financial stressors. Pt states she works in [employer].Pt states boyfriend is supportive but can get verbally aggressive when he drinks too much alcohol. Pt denies sexual/physical abuse. Pt states history of cutting as a child but has not cut recently.Pt states she was prescribed klonopin 0.5 and prozac in the past and has been effective. Pt states she does not have a PCP/therapist/psychiatrist. [facility], Pt states she would like to work on 1) "Managing my panic attacks" and 2) "Get on the right medications". Skin was assessed and found to be clear of any abnormal marks. Pt searched and no contraband found, POC and unit policies explained and understanding verbalized. Consents obtained. Food and fluids offered, and both accepted. Pt had no additional questions or concerns. No belongings PTA.

## 2018-07-07 NOTE — ED Notes (Signed)
VOL/ Consult ordered  

## 2018-07-07 NOTE — Progress Notes (Signed)
Chaplain met patient in hallway of ED BHU. Chaplain noted that the patient was laying on the bed in the hallway and was very tearful. Chaplain stopped to talk with her learning that she was "not feeling herself" and had wanted to come to the ED for three days; boyfriend dropped her off today. Patient indicated that she sat in a hot car with windows closed and had not wanted to wake up. Patient feeling unlike herself and unable to stop crying. She indicated that she was diagnosed at bipolar years ago and has gone without medication; she wondered if not, she is unable to handle this on her own. Chaplain provided emotional and spiritual support to the patient, asking about sources of support, presence of a plan to harm herself or her children, etc. Patient had not thought about counseling/therapy, but was open to considering it as a way to help her cope with the difficulties of life. Chaplain and patient prayed together and chaplain made patient aware of how she may be reached.    07/07/18 1200  Clinical Encounter Type  Visited With Patient  Visit Type Initial;Spiritual support;Behavioral Health  Spiritual Encounters  Spiritual Needs Prayer  Stress Factors  Patient Stress Factors Family relationships;Loss of control

## 2018-07-07 NOTE — ED Notes (Signed)
BEHAVIORAL HEALTH ROUNDING Patient sleeping: No. Patient alert and oriented: yes Behavior appropriate: Yes.  ; If no, describe:  Nutrition and fluids offered: yes Toileting and hygiene offered: Yes  Sitter present: q15 minute observations and security monitoring Law enforcement present: Yes    

## 2018-07-07 NOTE — ED Provider Notes (Signed)
K 3.1. Patient given oral potassium. Patient is medically cleared and is appropriate for psychiatric admission.   Kim [name], Calinda Stockinger, MD 07/07/18 [id number]

## 2018-07-07 NOTE — ED Notes (Signed)
Pt dressed out into appropriate behavioral health clothing with this tech and Shannon,RN in the rm. Pt belongings consist of a pair of pink flip flops, blue jeans, a white shirt, black bra, blue hair bow and gray/black panties. Pt tearful while dressing out.

## 2018-07-07 NOTE — Consult Note (Signed)
Kim [name]   Reason for [name]: [name] for 28 year old woman with a history of depression who comes voluntarily to the emergency room Referring Physician: Rip Harbour Patient Identification: Kim [name] MRN:  [id number] Principal Diagnosis: Bipolar depression [facility]) Diagnosis:   Patient Active Problem List   Diagnosis Date Noted  . Asthma, mild [J45.909] 07/07/2018  . Cannabis abuse [F12.10] 07/07/2018  . Ileus, postoperative ([place]) [K91.89, K56.7] 09/10/2016  . Status post tubal ligation [Z98.51] 09/10/2016  . Status post repeat low transverse cesarean section [Z98.891] 09/05/2016  . Anemia of mother in pregnancy, antepartum [O99.019] 06/29/2016  . GBS bacteriuria [R82.71] 04/06/2016  . Asthma affecting pregnancy, antepartum [O99.519, J45.909] 02/08/2016  . Short interval between pregnancies affecting pregnancy, antepartum [O09.899] 02/08/2016  . Previous cesarean section complicating pregnancy, antepartum condition or complication [F63.846] 65/99/3570  . H/O pre-eclampsia in prior pregnancy, currently pregnant [O09.299] 12/14/2014  . Asthma, moderate persistent, well-controlled [J45.40] 12/14/2014  . Tobacco smoking affecting pregnancy, antepartum [O99.330] 12/14/2014  . Bipolar depression ([place]) [F31.9] 12/14/2014  . Supervision of normal pregnancy [Z34.90] 11/16/2014  . History of diabetes as a child and gestational diabetes [Z86.39] 11/16/2014  . Underweight [R63.6] 11/16/2014    Total Time spent with patient: 1 hour  Subjective:   Kim [name] is a 28 y.o. female patient admitted with "it has been really bad.  I just feel like I want to die".  HPI: Patient interviewed chart reviewed.  28 year old woman with a history of mental health problems.  She says for the past 6 days her mood has been back to feeling extremely depressed.  Cannot sleep at night.  Barely eating.  Yesterday she locked herself in a hot car with the hope that that would kill her.   She is not currently receiving any psychiatric medicine.  Denies any hallucinations.  Does say that she is smoking marijuana daily.  Cannot put her finger on any specific stressor that set off this episode of depression but has not been getting any treatment in about 2 years.  Social history: Patient is not currently working outside the home.  Has 2 young children at home.  Relationship with her boyfriend is only so-so.  Medical history: Mild asthma.  Substance abuse history: No previous documented history but the patient tells me she smokes marijuana almost every day.  Denies other drug use.  Past Psychiatric History: Patient reports she has had a history of depression with a diagnosis of bipolar depression in the past although I am not sure if she has ever had a mania.  She has been seen at [facility] but has not been compliant there in a couple years.  Last medicine she was taking was [place] but she thought it was not helpful.  She remembers the combination of Prozac and Seroquel in the past being most helpful.  Positive past history of suicide attempts  Risk to Self: Suicidal Ideation: Yes-Currently Present Suicidal Intent: Yes-Currently Present Is patient at risk for suicide?: Yes Suicidal Plan?: Yes-Currently Present Specify Current Suicidal Plan: Pt planned to sit out in hot car and "go to sleep" hoping to not wake up Access to Means: Yes Specify Access to Suicidal Means: personal car What has been your use of drugs/alcohol within the last 12 months?: current marijuana use How many times?: 0 Other Self Harm Risks: Pt reports pulling out her hair.  Triggers for Past Attempts: Other personal contacts(nightmares) Intentional Self Injurious Behavior: Damaging(pulling out hair) Risk to Others: Homicidal Ideation: No Thoughts of Harm  to Others: No Current Homicidal Intent: No Current Homicidal Plan: No Access to Homicidal Means: No Identified Victim: n/a History of harm to others?:  No Assessment of Violence: None Noted Violent Behavior Description: throws objects when upset Does patient have access to weapons?: No Criminal Charges Pending?: No Does patient have a court date: No Prior Inpatient Therapy: Prior Inpatient Therapy: No Prior Outpatient Therapy: Prior Outpatient Therapy: Yes Prior Therapy Dates: 2017 Prior Therapy Facilty/Provider(s): [facility] Reason for Treatment: Bipolar Depression Does patient have an ACCT team?: No Does patient have Intensive In-House Services?  : No Does patient have [facility] services? : No Does patient have P4CC services?: No  Past Medical History:  Past Medical History:  Diagnosis Date  . Anemia   . Anxiety   . Asthma   . Chest pain   . Depression   . Fibromyalgia   . Gestational diabetes 2010, 2012  . Heartburn during pregnancy   . Rectal bleeding   . Seizures ([place]) 2015   x 1- unknown reason- passed out  . Ulcer     Past Surgical History:  Procedure Laterality Date  . CESAREAN SECTION     x 2  . CESAREAN SECTION WITH BILATERAL TUBAL LIGATION Bilateral 07/03/2015   Procedure: REPEAT CESAREAN SECTION ;  Surgeon: Osborne Oman, MD;  Location: [facility];  Service: Obstetrics;  Laterality: Bilateral;  . CESAREAN SECTION WITH BILATERAL TUBAL LIGATION Bilateral 09/05/2016   Procedure: REPEAT CESAREAN SECTION WITH BILATERAL TUBAL LIGATION;  Surgeon: Aletha Halim, MD;  Location: [facility];  Service: Obstetrics;  Laterality: Bilateral;  . COLONOSCOPY  04/16/2008  . WISDOM TOOTH EXTRACTION     Family History:  Family History  Problem Relation Age of Onset  . Depression Mother   . Fibromyalgia Mother   . Cancer Mother        breast  . Diabetes Maternal Grandmother   . Hypertension Maternal Grandmother   . Arthritis Maternal Grandmother   . Thyroid disease Maternal Grandmother    Family Psychiatric  History: No known family history Social History:  Social History   Substance and Sexual  Activity  Alcohol Use No     Social History   Substance and Sexual Activity  Drug Use Yes  . Types: Marijuana   Comment: not while pregnant    Social History   Socioeconomic History  . Marital status: Divorced    Spouse name: Not on file  . Number of children: Not on file  . Years of education: Not on file  . Highest education level: Not on file  Occupational History  . Not on file  Social Needs  . Financial resource strain: Not on file  . Food insecurity:    Worry: Not on file    Inability: Not on file  . Transportation needs:    Medical: Not on file    Non-medical: Not on file  Tobacco Use  . Smoking status: Current Every Day Smoker    Packs/day: 0.50    Types: Cigarettes  . Smokeless tobacco: Former Systems developer    Quit date: 08/24/2014  Substance and Sexual Activity  . Alcohol use: No  . Drug use: Yes    Types: Marijuana    Comment: not while pregnant  . Sexual activity: Yes    Birth control/protection: None  Lifestyle  . Physical activity:    Days per week: Not on file    Minutes per session: Not on file  . Stress: Not on file  Relationships  . Social connections:    Talks on phone: Not on file    Gets together: Not on file    Attends religious service: Not on file    Active member of club or organization: Not on file    Attends meetings of clubs or organizations: Not on file    Relationship status: Not on file  Other Topics Concern  . Not on file  Social History Narrative  . Not on file   Additional Social History:    Allergies:  No Known Allergies  Labs:  Results for orders placed or performed during the hospital encounter of 07/07/18 (from the past 48 hour(s))  Comprehensive metabolic panel     Status: Abnormal   Collection Time: 07/07/18 11:57 AM  Result Value Ref Range   Sodium 141 135 - 145 mmol/L   Potassium 3.1 (L) 3.5 - 5.1 mmol/L   Chloride 105 98 - 111 mmol/L    Comment: Please note change in reference range.   CO2 30 22 - 32 mmol/L    Glucose, Bld 98 70 - 99 mg/dL    Comment: Please note change in reference range.   BUN 8 6 - 20 mg/dL    Comment: Please note change in reference range.   Creatinine, Ser 0.78 0.44 - 1.00 mg/dL   Calcium 8.9 8.9 - 10.3 mg/dL   Total Protein 7.0 6.5 - 8.1 g/dL   Albumin 4.3 3.5 - 5.0 g/dL   AST 19 15 - 41 U/L   ALT 10 0 - 44 U/L    Comment: Please note change in reference range.   Alkaline Phosphatase 49 38 - 126 U/L   Total Bilirubin 0.6 0.3 - 1.2 mg/dL   GFR calc non Af Amer >60 >60 mL/min   GFR calc Af Amer >60 >60 mL/min    Comment: (NOTE) The eGFR has been calculated using the CKD EPI equation. This calculation has not been validated in all clinical situations. eGFR's persistently <60 mL/min signify possible Chronic Kidney Disease.    Anion gap 6 5 - 15    Comment: Performed at [facility], [address] [place], [place] [address]  Ethanol     Status: None   Collection Time: 07/07/18 11:57 AM  Result Value Ref Range   Alcohol, Ethyl (B) <10 <10 mg/dL    Comment: (NOTE) Lowest detectable limit for serum alcohol is 10 mg/dL. For medical purposes only. Performed at [facility], [address] [place], [place] [address]   cbc     Status: Abnormal   Collection Time: 07/07/18 11:57 AM  Result Value Ref Range   WBC 4.8 3.6 - 11.0 K/uL   RBC 4.07 3.80 - 5.20 MIL/uL   Hemoglobin 12.5 12.0 - 16.0 g/dL   HCT 35.9 35.0 - 47.0 %   MCV 88.2 80.0 - 100.0 fL   MCH 30.8 26.0 - 34.0 pg   MCHC 34.9 32.0 - 36.0 g/dL   RDW 14.6 (H) 11.5 - 14.5 %   Platelets 197 150 - 440 K/uL    Comment: Performed at [facility], [address] [place], [place] [address]  Urine Drug Screen, Qualitative     Status: Abnormal   Collection Time: 07/07/18 11:57 AM  Result Value Ref Range   Tricyclic, Ur Screen NONE DETECTED NONE DETECTED   Amphetamines, Ur Screen NONE DETECTED NONE DETECTED   MDMA (Ecstasy)Ur Screen NONE DETECTED NONE DETECTED   Cocaine Metabolite,Ur  [place] NONE DETECTED NONE  DETECTED   Opiate, Ur Screen NONE DETECTED NONE DETECTED   Phencyclidine (PCP) Ur S NONE DETECTED NONE DETECTED   Cannabinoid 50 Ng, Ur [place] POSITIVE (A) NONE DETECTED   Barbiturates, Ur Screen (A) NONE DETECTED    Result not available. Reagent lot number recalled by manufacturer.   Benzodiazepine, Ur Scrn NONE DETECTED NONE DETECTED   Methadone Scn, Ur NONE DETECTED NONE DETECTED    Comment: (NOTE) Tricyclics + metabolites, urine    Cutoff 1000 ng/mL Amphetamines + metabolites, urine  Cutoff 1000 ng/mL MDMA (Ecstasy), urine              Cutoff 500 ng/mL Cocaine Metabolite, urine          Cutoff 300 ng/mL Opiate + metabolites, urine        Cutoff 300 ng/mL Phencyclidine (PCP), urine         Cutoff 25 ng/mL Cannabinoid, urine                 Cutoff 50 ng/mL Barbiturates + metabolites, urine  Cutoff 200 ng/mL Benzodiazepine, urine              Cutoff 200 ng/mL Methadone, urine                   Cutoff 300 ng/mL The urine drug screen provides only a preliminary, unconfirmed analytical test result and should not be used for non-medical purposes. Clinical consideration and professional judgment should be applied to any positive drug screen result due to possible interfering substances. A more specific alternate chemical method must be used in order to obtain a confirmed analytical result. Gas chromatography / mass spectrometry (GC/MS) is the preferred confirmat ory method. Performed at [facility], [address] [place], [place] [address]   Acetaminophen level     Status: Abnormal   Collection Time: 07/07/18 11:57 AM  Result Value Ref Range   Acetaminophen (Tylenol), Serum <10 (L) 10 - 30 ug/mL    Comment: (NOTE) Therapeutic concentrations vary significantly. A range of 10-30 ug/mL  may be an effective concentration for many patients. However, some  are best treated at concentrations outside of this range. Acetaminophen concentrations >150 ug/mL at 4  hours after ingestion  and >50 ug/mL at 12 hours after ingestion are often associated with  toxic reactions. Performed at [facility], [address] [place], [place] [address]   Salicylate level     Status: None   Collection Time: 07/07/18 11:57 AM  Result Value Ref Range   Salicylate Lvl <3.2 2.8 - 30.0 mg/dL    Comment: Performed at [facility], [address] [place], [place] [address]  Pregnancy, urine     Status: None   Collection Time: 07/07/18 11:57 AM  Result Value Ref Range   Preg Test, Ur NEGATIVE NEGATIVE    Comment: Performed at [facility], [address] [place], [place] [address]  POC urine preg, ED     Status: None   Collection Time: 07/07/18 12:01 PM  Result Value Ref Range   Preg Test, Ur Negative Negative  CK     Status: None   Collection Time: 07/07/18 12:51 PM  Result Value Ref Range   Total CK 89 38 - 234 U/L    Comment: Performed at [facility], [address] [place],  [address]    Current Facility-Administered Medications  Medication Dose Route Frequency Provider Last Rate Last Dose  . nicotine (NICODERM CQ - dosed in  mg/24 hours) patch 21 mg  21 mg Transdermal Once Nena Polio, MD   21 mg at 07/07/18 1242   Current Outpatient Medications  Medication Sig Dispense Refill  . acetaminophen (TYLENOL) 500 MG tablet Take 1,000 mg by mouth every 6 (six) hours as needed for mild pain or headache.    . ALPRAZolam (XANAX) 0.5 MG tablet Take 1 tablet (0.5 mg total) by mouth at bedtime as needed for anxiety. Take once a day by mouth as needed 30 tablet 3  . aspirin 81 MG chewable tablet Chew 1 tablet (81 mg total) by mouth daily. 90 tablet 3  . beclomethasone (QVAR) 40 MCG/ACT inhaler Inhale 2 puffs into the lungs 2 (two) times daily. (Patient not taking: Reported on 08/30/2016) 1 Inhaler 12  . calcium carbonate (TUMS - DOSED IN MG ELEMENTAL CALCIUM) 500 MG chewable tablet Chew 2 tablets by mouth 3 (three)  times daily as needed for indigestion or heartburn.    . cyclobenzaprine (FLEXERIL) 5 MG tablet Take 1 tablet (5 mg total) by mouth 3 (three) times daily as needed for muscle spasms. 15 tablet 0  . diphenhydrAMINE-zinc acetate (BENADRYL) cream Apply 1 application topically daily as needed for itching.    . ondansetron (ZOFRAN ODT) 4 MG disintegrating tablet Take 1 tablet (4 mg total) by mouth every 8 (eight) hours as needed for nausea or vomiting. 15 tablet 0    Musculoskeletal: Strength & Muscle Tone: within normal limits Gait & Station: normal Patient leans: N/A  Psychiatric Specialty Exam: Physical Exam  Nursing note and vitals reviewed. Constitutional: She appears well-developed and well-nourished.  HENT:  Head: Normocephalic and atraumatic.  Eyes: Pupils are equal, round, and reactive to light. Conjunctivae are normal.  Neck: Normal range of motion.  Cardiovascular: Regular rhythm and normal heart sounds.  Respiratory: Effort normal. No respiratory distress.  GI: Soft.  Musculoskeletal: Normal range of motion.  Neurological: She is alert.  Skin: Skin is warm and dry.  Psychiatric: Her mood appears anxious. Her speech is delayed. She is slowed and withdrawn. Cognition and memory are impaired. She expresses impulsivity. She exhibits a depressed mood. She expresses suicidal ideation.    Review of Systems  Constitutional: Negative.   HENT: Negative.   Eyes: Negative.   Respiratory: Negative.   Cardiovascular: Negative.   Gastrointestinal: Negative.   Musculoskeletal: Negative.   Skin: Negative.   Neurological: Negative.   Psychiatric/Behavioral: Positive for depression, substance abuse and suicidal ideas. Negative for hallucinations and memory loss. The patient is nervous/anxious and has insomnia.     Blood pressure 99/73, pulse (!) 104, temperature 98.6 F (37 C), temperature source Oral, resp. rate 16, height 5' 8"  (1.727 m), weight 44.5 kg (98 lb), last menstrual period  06/08/2018, SpO2 97 %, unknown if currently breastfeeding.Body mass index is 14.9 kg/m.  General Appearance: Disheveled  Eye Contact:  Fair  Speech:  Slow  Volume:  Decreased  Mood:  Depressed  Affect:  Constricted and Depressed  Thought Process:  Goal Directed  Orientation:  Full (Time, Place, and Person)  Thought Content:  Logical and Tangential  Suicidal Thoughts:  Yes.  with intent/plan  Homicidal Thoughts:  No  Memory:  Immediate;   Fair Recent;   Fair Remote;   Fair  Judgement:  Impaired  Insight:  Shallow  Psychomotor Activity:  Decreased  Concentration:  Concentration: Fair  Recall:  [employer] of Knowledge:  Fair  Language:  Fair  Akathisia:  No  Handed:  Right  AIMS (if indicated):     Assets:  Desire for Improvement Housing Physical Health  ADL's:  Impaired  Cognition:  WNL  Sleep:        Treatment Plan Summary: Daily contact with patient to assess and evaluate symptoms and progress in treatment, Medication management and Plan 28 year old woman with a history of depression reports several days of severe depression with active suicidal thoughts.  Very down and tearful.  Agrees that she needs inpatient treatment.  [facility] has evidently suggested that we refer her there.  Patient agrees to plan.  No need at this point for commitment.  Case reviewed with TTS and ER physician and patient can be transferred to behavioral health Hospital for further treatment.  Labs reviewed  Disposition: Recommend psychiatric Inpatient admission when medically cleared. Supportive therapy provided about ongoing stressors.  Alethia Berthold, MD 07/07/2018 4:11 PM

## 2018-07-07 NOTE — BH Assessment (Signed)
Patient has been accepted to [facility].  Patient assigned to room 402-2 Accepting physician is Dr. Jola Babinskilary.  Call report to [phone].  [facility]   ER Staff is aware of it:  [facility]  Dr. Derrill KayGoodman, ER MD  Nicole Cellaorothy Patient's Nurse

## 2018-07-07 NOTE — ED Provider Notes (Signed)
[facility] Emergency Department Provider Note   ____________________________________________   First MD Initiated Contact with Patient 07/07/18 1159     (approximate)  I have reviewed the triage vital signs and the nursing notes.   HISTORY  Chief Complaint Suicidal    HPI Kim [name] is a 28 y.o. female reports she is feeling very depressed.  She locked herself in the car yesterday in the hot sun wanting to "go to sleep".   she woke back up again and decided to come out of the car.  Patient is very depressed and upset tearful in the ER.  She reports she was diagnosed bipolar when she was younger and has not had any medicine for a number of years that she did needed.   Past Medical History:  Diagnosis Date  . Anemia   . Anxiety   . Asthma   . Chest pain   . Depression   . Fibromyalgia   . Gestational diabetes 2010, 2012  . Heartburn during pregnancy   . Rectal bleeding   . Seizures (HCC) 2015   x 1- unknown reason- passed out  . Ulcer     Patient Active Problem List   Diagnosis Date Noted  . Ileus, postoperative (HCC) 09/10/2016  . Status post tubal ligation 09/10/2016  . Status post repeat low transverse cesarean section 09/05/2016  . Anemia of mother in pregnancy, antepartum 06/29/2016  . GBS bacteriuria 04/06/2016  . Asthma affecting pregnancy, antepartum 02/08/2016  . Short interval between pregnancies affecting pregnancy, antepartum 02/08/2016  . Previous cesarean section complicating pregnancy, antepartum condition or complication 12/14/2014  . H/O pre-eclampsia in prior pregnancy, currently pregnant 12/14/2014  . Asthma, moderate persistent, well-controlled 12/14/2014  . Tobacco smoking affecting pregnancy, antepartum 12/14/2014  . Bipolar depression (HCC) 12/14/2014  . Supervision of normal pregnancy 11/16/2014  . History of diabetes as a child and gestational diabetes 11/16/2014  . Underweight 11/16/2014    Past Surgical  History:  Procedure Laterality Date  . CESAREAN SECTION     x 2  . CESAREAN SECTION WITH BILATERAL TUBAL LIGATION Bilateral 07/03/2015   Procedure: REPEAT CESAREAN SECTION ;  Surgeon: Tereso NewcomerUgonna A Anyanwu, MD;  Location: [facility];  Service: Obstetrics;  Laterality: Bilateral;  . CESAREAN SECTION WITH BILATERAL TUBAL LIGATION Bilateral 09/05/2016   Procedure: REPEAT CESAREAN SECTION WITH BILATERAL TUBAL LIGATION;  Surgeon: [place] Bingharlie Pickens, MD;  Location: [facility];  Service: Obstetrics;  Laterality: Bilateral;  . COLONOSCOPY  04/16/2008  . WISDOM TOOTH EXTRACTION      Prior to Admission medications   Medication Sig Start Date End Date Taking? Authorizing Provider  acetaminophen (TYLENOL) 500 MG tablet Take 1,000 mg by mouth every 6 (six) hours as needed for mild pain or headache.    [provider]  ALPRAZolam Prudy Feeler(XANAX) 0.5 MG tablet Take 1 tablet (0.5 mg total) by mouth at bedtime as needed for anxiety. Take once a day by mouth as needed 06/27/16   Willodean RosenthalHarraway-Smith, Carolyn, MD  aspirin 81 MG chewable tablet Chew 1 tablet (81 mg total) by mouth daily. 03/07/16   Reva BoresPratt, Tanya S, MD  beclomethasone (QVAR) 40 MCG/ACT inhaler Inhale 2 puffs into the lungs 2 (two) times daily. Patient not taking: Reported on 08/30/2016 03/07/16   Reva BoresPratt, Tanya S, MD  calcium carbonate (TUMS - DOSED IN MG ELEMENTAL CALCIUM) 500 MG chewable tablet Chew 2 tablets by mouth 3 (three) times daily as needed for indigestion or heartburn.    [provider]  cyclobenzaprine (FLEXERIL) 5 MG tablet Take 1 tablet (5 mg total) by mouth 3 (three) times daily as needed for muscle spasms. 06/10/18   Menshew, Charlesetta Ivory, PA-C  diphenhydrAMINE-zinc acetate (BENADRYL) cream Apply 1 application topically daily as needed for itching.    [provider]  ondansetron (ZOFRAN ODT) 4 MG disintegrating tablet Take 1 tablet (4 mg total) by mouth every 8 (eight) hours as needed for nausea or vomiting. 12/03/16   Tommi Rumps, PA-C    Allergies Patient has no known allergies.  Family History  Problem Relation Age of Onset  . Depression Mother   . Fibromyalgia Mother   . Cancer Mother        breast  . Diabetes Maternal Grandmother   . Hypertension Maternal Grandmother   . Arthritis Maternal Grandmother   . Thyroid disease Maternal Grandmother     Social History Social History   Tobacco Use  . Smoking status: Current Every Day Smoker    Packs/day: 0.50    Types: Cigarettes  . Smokeless tobacco: Former Neurosurgeon    Quit date: 08/24/2014  Substance Use Topics  . Alcohol use: No  . Drug use: Yes    Types: Marijuana    Comment: not while pregnant    Review of Systems  Constitutional: No fever/chills Eyes: No visual changes. ENT: No sore throat. Cardiovascular: Denies chest pain. Respiratory: Denies shortness of breath. Gastrointestinal: No abdominal pain.  No nausea, no vomiting.  No diarrhea.  No constipation. Genitourinary: Negative for dysuria. Musculoskeletal: Negative for back pain. Skin: Negative for rash. Neurological: Negative for headaches, focal weakness  ____________________________________________   PHYSICAL EXAM:  VITAL SIGNS: ED Triage Vitals  Enc Vitals Group     BP 07/07/18 1152 99/73     Pulse Rate 07/07/18 1152 (!) 104     Resp 07/07/18 1152 16     Temp 07/07/18 1152 98.6 F (37 C)     Temp Source 07/07/18 1152 Oral     SpO2 07/07/18 1152 97 %     Weight 07/07/18 1154 98 lb (44.5 kg)     Height 07/07/18 1154 5\' 8"  (1.727 m)     Head Circumference --      Peak Flow --      Pain Score 07/07/18 1154 0     Pain Loc --      Pain Edu? --      Excl. in GC? --     Constitutional: Alert and oriented. Well appearing and in no acute distress. Eyes: Conjunctivae are normal.  Head: Atraumatic. Nose: No congestion/rhinnorhea. Mouth/Throat: Mucous membranes are moist.  Oropharynx non-erythematous. Neck: No stridor Cardiovascular: Normal rate, regular rhythm.  Grossly normal heart sounds.  Good peripheral circulation. Respiratory: Normal respiratory effort.  No retractions. Lungs CTAB. Gastrointestinal: Soft and nontender. No distention. No abdominal bruits. No CVA tenderness. Musculoskeletal: No lower extremity tenderness nor edema.   Neurologic:  Normal speech and language. No gross focal neurologic deficits are appreciated. No gait instability. Skin:  Skin is warm, dry and intact. No rash noted. .  ____________________________________________   LABS (all labs ordered are listed, but only abnormal results are displayed)  Labs Reviewed  COMPREHENSIVE METABOLIC PANEL - Abnormal; Notable for the following components:      Result Value   Potassium 3.1 (*)    All other components within normal limits  CBC - Abnormal; Notable for the following components:   RDW 14.6 (*)    All other components within normal  limits  URINE DRUG SCREEN, QUALITATIVE ([facility] ONLY) - Abnormal; Notable for the following components:   Cannabinoid 50 Ng, Ur [place] POSITIVE (*)    Barbiturates, Ur Screen   (*)    Value: Result not available. Reagent lot number recalled by manufacturer.   All other components within normal limits  ACETAMINOPHEN LEVEL - Abnormal; Notable for the following components:   Acetaminophen (Tylenol), Serum <10 (*)    All other components within normal limits  ETHANOL  SALICYLATE LEVEL  PREGNANCY, URINE  CK  POC URINE PREG, ED   ____________________________________________  EKG   ____________________________________________  RADIOLOGY  ED MD interpretation:    Official radiology report(s): No results found.  ____________________________________________   PROCEDURES  Procedure(s) performed:   Procedures  Critical Care performed:  ____________________________________________   INITIAL IMPRESSION / ASSESSMENT AND PLAN / ED COURSE           ____________________________________________   FINAL CLINICAL  IMPRESSION(S) / ED DIAGNOSES  Final diagnoses:  Suicidal ideation  Major depressive disorder with single episode, remission status unspecified     ED Discharge Orders    None       Note:  This document was prepared using Dragon voice recognition software and may include unintentional dictation errors.    Arnaldo Natal, MD 07/07/18 [phone]

## 2018-07-07 NOTE — ED Notes (Addendum)
Pt report called in to [place] [facility] RN Patrice.

## 2018-07-08 ENCOUNTER — Encounter (HOSPITAL_COMMUNITY): Payer: Self-pay | Admitting: Behavioral Health

## 2018-07-08 DIAGNOSIS — T1491XA Suicide attempt, initial encounter: Secondary | ICD-10-CM

## 2018-07-08 DIAGNOSIS — G4700 Insomnia, unspecified: Secondary | ICD-10-CM

## 2018-07-08 DIAGNOSIS — F419 Anxiety disorder, unspecified: Secondary | ICD-10-CM

## 2018-07-08 DIAGNOSIS — F17210 Nicotine dependence, cigarettes, uncomplicated: Secondary | ICD-10-CM

## 2018-07-08 DIAGNOSIS — F332 Major depressive disorder, recurrent severe without psychotic features: Secondary | ICD-10-CM

## 2018-07-08 DIAGNOSIS — F410 Panic disorder [episodic paroxysmal anxiety] without agoraphobia: Secondary | ICD-10-CM

## 2018-07-08 DIAGNOSIS — Z818 Family history of other mental and behavioral disorders: Secondary | ICD-10-CM

## 2018-07-08 DIAGNOSIS — Z630 Problems in relationship with spouse or partner: Secondary | ICD-10-CM

## 2018-07-08 HISTORY — DX: Suicide attempt, initial encounter: T14.91XA

## 2018-07-08 MED ORDER — ENSURE ENLIVE PO LIQD
237.0000 mL | Freq: Two times a day (BID) | ORAL | Status: DC
  Administered 2018-07-08 – 2018-07-11 (×7): 237 mL via ORAL

## 2018-07-08 MED ORDER — POTASSIUM CHLORIDE 20 MEQ PO PACK
20.0000 meq | PACK | Freq: Every day | ORAL | Status: DC
  Filled 2018-07-08 (×3): qty 1

## 2018-07-08 MED ORDER — FLUOXETINE HCL 10 MG PO CAPS
10.0000 mg | ORAL_CAPSULE | Freq: Every day | ORAL | Status: DC
  Administered 2018-07-08 – 2018-07-10 (×3): 10 mg via ORAL
  Filled 2018-07-08 (×5): qty 1

## 2018-07-08 MED ORDER — POTASSIUM CHLORIDE 20 MEQ/15ML (10%) PO SOLN
20.0000 meq | Freq: Every day | ORAL | Status: DC
  Administered 2018-07-08 – 2018-07-09 (×2): 20 meq via ORAL
  Filled 2018-07-08 (×3): qty 15

## 2018-07-08 MED ORDER — TRAZODONE HCL 50 MG PO TABS
25.0000 mg | ORAL_TABLET | Freq: Every evening | ORAL | Status: DC | PRN
  Administered 2018-07-08 – 2018-07-09 (×2): 25 mg via ORAL
  Filled 2018-07-08 (×2): qty 1

## 2018-07-08 MED ORDER — BOOST / RESOURCE BREEZE PO LIQD CUSTOM
1.0000 | ORAL | Status: DC
  Administered 2018-07-08 – 2018-07-10 (×3): 1 via ORAL
  Filled 2018-07-08 (×5): qty 1

## 2018-07-08 MED ORDER — ADULT MULTIVITAMIN W/MINERALS CH
1.0000 | ORAL_TABLET | Freq: Every day | ORAL | Status: DC
  Administered 2018-07-08 – 2018-07-11 (×5): 1 via ORAL
  Filled 2018-07-08 (×8): qty 1

## 2018-07-08 MED ORDER — QUETIAPINE FUMARATE 25 MG PO TABS
25.0000 mg | ORAL_TABLET | Freq: Every day | ORAL | Status: DC
  Administered 2018-07-08 – 2018-07-09 (×2): 25 mg via ORAL
  Filled 2018-07-08 (×4): qty 1

## 2018-07-08 NOTE — [facility] Group Notes (Signed)
LCSW Group Therapy Note 07/08/2018 11:26 AM  Type of Therapy and Topic: Group Therapy: Overcoming Obstacles  Participation Level: Did Not Attend  Description of Group:  In this group patients will be encouraged to explore what they see as obstacles to their own wellness and recovery. They will be guided to discuss their thoughts, feelings, and behaviors related to these obstacles. The group will process together ways to cope with barriers, with attention given to specific choices patients can make. Each patient will be challenged to identify changes they are motivated to make in order to overcome their obstacles. This group will be process-oriented, with patients participating in exploration of their own experiences as well as giving and receiving support and challenge from other group members.  Therapeutic Goals: 1. Patient will identify personal and current obstacles as they relate to admission. 2. Patient will identify barriers that currently interfere with their wellness or overcoming obstacles.  3. Patient will identify feelings, thought process and behaviors related to these barriers. 4. Patient will identify two changes they are willing to make to overcome these obstacles:   Summary of Patient Progress  Invited, chose not to attend.    Therapeutic Modalities:  Cognitive Behavioral Therapy Solution Focused Therapy Motivational Interviewing Relapse Prevention Therapy   Lavan Imes LCSWA Clinical Social Worker   

## 2018-07-08 NOTE — H&P (Addendum)
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 tolerate well . Last psychiatric medication was 2 years ago . Medical history remarkable for Asthma. NKDA. Describes history of Cocaine Use Disorder, sober x 4 years, smokes cannabis regularly , denies alcohol abuse . Of note, she states she has eaten today, 75% or more of both meals thus far today, as well as an Ensure supplement .  Dx- Bipolar Disorder, Depressed, PTSD by history, Cannabis Use Disorder, Cocaine Use Disorder in sustained remission  Plan- Inpatient admission. Reports history of good tolerance and response to Prozac and Seroquel in the past, and expresses interest in restarting these medications.  Start Seroquel 25 mgrs QDAY initially, start Prozac 10 mgrs QDAY initially  Currently on K+ supplementation for hypokalemia . Check EKG , BMP, TSH, HgbA1c, CBC
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
## 2018-07-08 NOTE — Progress Notes (Signed)
NUTRITION ASSESSMENT  Pt identified as at risk on the Malnutrition Screen Tool  INTERVENTION: Supplements:  - continue Ensure Enlive BID, each supplement provides 350 kcal and 20 grams of protein. - will order Boost Breeze once/day, this supplement provides 250 kcal and 9 grams of protein. - will order daily multivitamin with minerals.   NUTRITION DIAGNOSIS: Underweight d/t financial stressors, substance abuse as evidenced by patient report.   Goal: Pt to meet >/= 90% of their estimated nutrition needs.  Monitor:  PO intake  Assessment:  Patient admitted with SI with a plan. Patient uses marijuana and smokes cigarettes. She reports financial stressors. Patient weight is currently consistent with weight from September but is concerning given BMI. Orders placed as outlined above. Continue to encourage PO intakes of meals, supplements, and snacks.     28 y.o. female  Height: Ht Readings from Last 1 Encounters:  07/07/18 5\' 8"  (1.727 m)    Weight: Wt Readings from Last 1 Encounters:  07/07/18 102 lb (46.3 kg)    Weight Hx: Wt Readings from Last 10 Encounters:  07/07/18 102 lb (46.3 kg)  07/07/18 98 lb (44.5 kg)  06/10/18 103 lb (46.7 kg)  01/27/18 103 lb (46.7 kg)  09/10/17 103 lb (46.7 kg)  03/08/17 108 lb (49 kg)  12/03/16 108 lb (49 kg)  08/24/16 128 lb (58.1 kg)  08/30/16 129 lb (58.5 kg)  08/23/16 126 lb (57.2 kg)    BMI:  Body mass index is 15.51 kg/m. Pt meets criteria for underweight based on current BMI.  Estimated Nutritional Needs: Kcal: 25-30 kcal/kg Protein: > 1 gram protein/kg Fluid: 1 ml/kcal  Diet Order:  Diet Order           Diet regular Room service appropriate? No; Fluid consistency: Thin  Diet effective now         Pt is also offered choice of unit snacks mid-morning and mid-afternoon.  Pt is eating as desired.   Lab results and medications reviewed.      Trenton GammonJessica Dajah Fischman, MS, RD, LDN, [facility] Inpatient Clinical Dietitian Pager #  [phone] After hours/weekend pager # [phone]

## 2018-07-08 NOTE — Progress Notes (Signed)
Patient ID: Kim CooleyJessica R Leeman, female   DOB: 1990-06-23, 28 y.o.   MRN: [id number] D) Pt affect and mood have been anxious, sad, depressed this shift. Pt tremulous at times, particularly when talking to boyfriend on phone. Pt compliant with medications. Positive for a.m. Group. Isolating in room with roommate. Pt is cooperative on approach. Contracts for safety. A) level 3 obs for safety. Support and encouragement provided. Prompts as needed. Med ed initiated and reinforced. R) Cooperative.

## 2018-07-08 NOTE — [facility] Suicide Risk Assessment (Signed)
[facility] INPATIENT:  Family/Significant Other Suicide Prevention Education  Suicide Prevention Education:  Patient Refusal for Family/Significant Other Suicide Prevention Education: The patient Kim [name] has refused to provide written consent for family/significant other to be provided Family/Significant Other Suicide Prevention Education during admission and/or prior to discharge.  Physician notified.  SPE completed with patient, as patient refused to consent to family contact. SPI pamphlet provided to pt and pt was encouraged to share information with support network, ask questions, and talk about any concerns relating to SPE. Patient denies access to guns/firearms and verbalized understanding of information provided. Mobile Crisis information also provided to patient.    Kim [name] 07/08/2018, 9:38 AM
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
## 2018-07-09 LAB — CBC WITH DIFFERENTIAL/PLATELET
BASOS ABS: 0 10*3/uL (ref 0.0–0.1)
BASOS PCT: 1 %
EOS ABS: 0.3 10*3/uL (ref 0.0–0.7)
Eosinophils Relative: 4 %
HCT: 39.5 % (ref 36.0–46.0)
HEMOGLOBIN: 12.8 g/dL (ref 12.0–15.0)
Lymphocytes Relative: 49 %
Lymphs Abs: 2.9 10*3/uL (ref 0.7–4.0)
MCH: 29.4 pg (ref 26.0–34.0)
MCHC: 32.4 g/dL (ref 30.0–36.0)
MCV: 90.8 fL (ref 78.0–100.0)
Monocytes Absolute: 0.5 10*3/uL (ref 0.1–1.0)
Monocytes Relative: 9 %
NEUTROS PCT: 37 %
Neutro Abs: 2.2 10*3/uL (ref 1.7–7.7)
Platelets: 242 10*3/uL (ref 150–400)
RBC: 4.35 MIL/uL (ref 3.87–5.11)
RDW: 13.9 % (ref 11.5–15.5)
WBC: 5.9 10*3/uL (ref 4.0–10.5)

## 2018-07-09 LAB — BASIC METABOLIC PANEL
ANION GAP: 8 (ref 5–15)
BUN: 12 mg/dL (ref 6–20)
CALCIUM: 9 mg/dL (ref 8.9–10.3)
CO2: 31 mmol/L (ref 22–32)
CREATININE: 0.71 mg/dL (ref 0.44–1.00)
Chloride: 103 mmol/L (ref 98–111)
GFR calc non Af Amer: >60 mL/min (ref >60)
Glucose, Bld: 91 mg/dL (ref 70–99)
Potassium: 3.3 mmol/L — ABNORMAL LOW (ref 3.5–5.1)
SODIUM: 142 mmol/L (ref 135–145)

## 2018-07-09 LAB — LIPID PANEL
CHOL/HDL RATIO: 2.1 ratio
Cholesterol: 100 mg/dL (ref 0–200)
HDL: 47 mg/dL (ref >40)
LDL Cholesterol: 41 mg/dL (ref 0–99)
Triglycerides: 60 mg/dL (ref <150)
VLDL: 12 mg/dL (ref 0–40)

## 2018-07-09 LAB — TSH: TSH: 2.292 u[IU]/mL (ref 0.350–4.500)

## 2018-07-09 MED ORDER — POTASSIUM CHLORIDE CRYS ER 20 MEQ PO TBCR
20.0000 meq | EXTENDED_RELEASE_TABLET | Freq: Two times a day (BID) | ORAL | Status: AC
  Administered 2018-07-09 – 2018-07-10 (×3): 20 meq via ORAL
  Filled 2018-07-09 (×3): qty 1

## 2018-07-09 MED ORDER — POTASSIUM CHLORIDE CRYS ER 20 MEQ PO TBCR
20.0000 meq | EXTENDED_RELEASE_TABLET | Freq: Once | ORAL | Status: DC
  Filled 2018-07-09: qty 1

## 2018-07-09 MED ORDER — QUETIAPINE FUMARATE 25 MG PO TABS
25.0000 mg | ORAL_TABLET | Freq: Two times a day (BID) | ORAL | Status: DC
  Administered 2018-07-09 – 2018-07-10 (×2): 25 mg via ORAL
  Filled 2018-07-09 (×6): qty 1

## 2018-07-09 NOTE — [facility] Group Notes (Signed)
[facility] 07/09/2018 1:15pm  Type of Therapy: Mental Health Association Presentation  Participation Level: Active  Participation Quality: Attentive  Affect: Appropriate  Cognitive: Oriented  Insight: Developing/Improving  Engagement in Therapy: Engaged  Modes of Intervention: Discussion, Education and Socialization  Summary of Progress/Problems: Mental Health Association (MHA) Speaker came to talk about his personal journey with mental health. The pt processed ways by which to relate to the speaker. MHA speaker provided handouts and educational information pertaining to groups and services offered by the [facility]. Pt was engaged in speaker's presentation and was receptive to resources provided.    Rona RavensHeather S Anddy Wingert, LCSW 07/09/2018 2:46 PM

## 2018-07-09 NOTE — Progress Notes (Signed)
[facility] MD Progress Note  07/09/2018 5:51 PM Kim [name]  MRN:  [id number] Subjective: Patient reports he feels better than on admission but states she is feeling more anxious today.  Attributes this to witnessing another patient having episode of agitation/needing staff intervention, and to attempting to call her younger children's father and grandparents ( whom they are currently with) and not being allowed to talk toto them. Denies suicidal ideations. Reports she has been eating "a little better". Objective: I have discussed case with treatment team and have met with patient. She is a 28 year old female who presented to hospital due to worsening depression, neurovegetative symptoms .  She endorses history of bipolar spectrum disorder. Today, as above, reports partially improved mood but states she is feeling anxious.  Initially presented anxious but responded well to support/reassurance and affect tended to improve during session. Denies suicidal ideations. Behavior on unit in good control, no disruptive or agitated behaviors, visible on unit, going some groups. Thus far tolerating medications well. Labs reviewed as below, K+ improved to 3.3, BUN/Creatinine WNL. TSH 2.29 I have again reviewed psychiatric history-patient is underweight, with BMI of 15.5 on admission and presented with hypokalemia ( improving).  She reports she has been prone to low weight "always", she does acknowledge that her p.o. intake had decreased recently due to depression but she has been eating better since she has been in the hospital.  She denies any history of eating disorder, denies any purging/vomiting or any restriction. Principal Problem:Depression Diagnosis:   Patient Active Problem List   Diagnosis Date Noted  . MDD (major depressive disorder), recurrent severe, without psychosis ([place]) [F33.2] 07/08/2018  . Suicide attempt ([place]) [T14.91XA] 07/08/2018  . Asthma, mild [J45.909] 07/07/2018  . Cannabis abuse  [F12.10] 07/07/2018  . Ileus, postoperative ([place]) [K91.89, K56.7] 09/10/2016  . Status post tubal ligation [Z98.51] 09/10/2016  . Status post repeat low transverse cesarean section [Z98.891] 09/05/2016  . Anemia of mother in pregnancy, antepartum [O99.019] 06/29/2016  . GBS bacteriuria [R82.71] 04/06/2016  . Asthma affecting pregnancy, antepartum [O99.519, J45.909] 02/08/2016  . Short interval between pregnancies affecting pregnancy, antepartum [O09.899] 02/08/2016  . Previous cesarean section complicating pregnancy, antepartum condition or complication [F02.637] 85/88/5027  . H/O pre-eclampsia in prior pregnancy, currently pregnant [O09.299] 12/14/2014  . Asthma, moderate persistent, well-controlled [J45.40] 12/14/2014  . Tobacco smoking affecting pregnancy, antepartum [O99.330] 12/14/2014  . Bipolar depression ([place]) [F31.9] 12/14/2014  . Supervision of normal pregnancy [Z34.90] 11/16/2014  . History of diabetes as a child and gestational diabetes [Z86.39] 11/16/2014  . Underweight [R63.6] 11/16/2014   Total Time spent with patient: 20 minutes  Past Psychiatric History:  Past Medical History:  Past Medical History:  Diagnosis Date  . Anemia   . Anxiety   . Asthma   . Chest pain   . Depression   . Fibromyalgia   . Gestational diabetes 2010, 2012  . Heartburn during pregnancy   . Rectal bleeding   . Seizures ([place]) 2015   x 1- unknown reason- passed out  . Ulcer     Past Surgical History:  Procedure Laterality Date  . CESAREAN SECTION     x 2  . CESAREAN SECTION WITH BILATERAL TUBAL LIGATION Bilateral 07/03/2015   Procedure: REPEAT CESAREAN SECTION ;  Surgeon: Osborne Oman, MD;  Location: [facility];  Service: Obstetrics;  Laterality: Bilateral;  . CESAREAN SECTION WITH BILATERAL TUBAL LIGATION Bilateral 09/05/2016   Procedure: REPEAT CESAREAN SECTION WITH BILATERAL TUBAL LIGATION;  Surgeon: Aletha Halim, MD;  Location: [facility];  Service: Obstetrics;  Laterality:  Bilateral;  . COLONOSCOPY  04/16/2008  . WISDOM TOOTH EXTRACTION     Family History:  Family History  Problem Relation Age of Onset  . Depression Mother   . Fibromyalgia Mother   . Cancer Mother        breast  . Diabetes Maternal Grandmother   . Hypertension Maternal Grandmother   . Arthritis Maternal Grandmother   . Thyroid disease Maternal Grandmother    Family Psychiatric  History:  Social History:  Social History   Substance and Sexual Activity  Alcohol Use No     Social History   Substance and Sexual Activity  Drug Use Yes  . Types: Marijuana   Comment: not while pregnant    Social History   Socioeconomic History  . Marital status: Divorced    Spouse name: Not on file  . Number of children: Not on file  . Years of education: Not on file  . Highest education level: Not on file  Occupational History  . Not on file  Social Needs  . Financial resource strain: Not on file  . Food insecurity:    Worry: Not on file    Inability: Not on file  . Transportation needs:    Medical: Not on file    Non-medical: Not on file  Tobacco Use  . Smoking status: Current Every Day Smoker    Packs/day: 0.50    Types: Cigarettes  . Smokeless tobacco: Former Systems developer    Quit date: 08/24/2014  Substance and Sexual Activity  . Alcohol use: No  . Drug use: Yes    Types: Marijuana    Comment: not while pregnant  . Sexual activity: Yes    Birth control/protection: None  Lifestyle  . Physical activity:    Days per week: Not on file    Minutes per session: Not on file  . Stress: Not on file  Relationships  . Social connections:    Talks on phone: Not on file    Gets together: Not on file    Attends religious service: Not on file    Active member of club or organization: Not on file    Attends meetings of clubs or organizations: Not on file    Relationship status: Not on file  Other Topics Concern  . Not on file  Social History Narrative  . Not on file   Additional Social  History:   Sleep: Good  Appetite:  Good  Current Medications: Current Facility-Administered Medications  Medication Dose Route Frequency Provider Last Rate Last Dose  . acetaminophen (TYLENOL) tablet 650 mg  650 mg Oral Q6H PRN Niel Hummer, NP   650 mg at 07/08/18 1209  . alum & mag hydroxide-simeth (MAALOX/MYLANTA) 200-200-20 MG/5ML suspension 30 mL  30 mL Oral Q4H PRN Niel Hummer, NP      . feeding supplement (BOOST / RESOURCE BREEZE) liquid 1 Container  1 Container Oral Q24H Sharma Covert, MD   1 Container at 07/09/18 1542  . feeding supplement (ENSURE ENLIVE) (ENSURE ENLIVE) liquid 237 mL  237 mL Oral BID BM Sharma Covert, MD   237 mL at 07/09/18 1040  . FLUoxetine (PROZAC) capsule 10 mg  10 mg Oral Daily Cobos, Myer Peer, MD   10 mg at 07/09/18 0754  . hydrOXYzine (ATARAX/VISTARIL) tablet 25 mg  25 mg Oral Q6H PRN Elmarie Shiley A, NP   25 mg at 07/09/18 1543  . magnesium  hydroxide (MILK OF MAGNESIA) suspension 30 mL  30 mL Oral Daily PRN Elmarie Shiley A, NP      . multivitamin with minerals tablet 1 tablet  1 tablet Oral Daily Sharma Covert, MD   1 tablet at 07/09/18 0754  . nicotine (NICODERM CQ - dosed in mg/24 hours) patch 21 mg  21 mg Transdermal Daily Lindon Romp A, NP   21 mg at 07/09/18 0754  . potassium chloride SA (K-DUR,KLOR-CON) CR tablet 20 mEq  20 mEq Oral BID Cobos, Myer Peer, MD   20 mEq at 07/09/18 1706  . QUEtiapine (SEROQUEL) tablet 25 mg  25 mg Oral BID Cobos, Myer Peer, MD   25 mg at 07/09/18 1706  . traZODone (DESYREL) tablet 25 mg  25 mg Oral QHS PRN Cobos, Myer Peer, MD   25 mg at 07/08/18 2113    Lab Results:  Results for orders placed or performed during the hospital encounter of 07/07/18 (from the past 48 hour(s))  TSH     Status: None   Collection Time: 07/09/18  6:27 AM  Result Value Ref Range   TSH 2.292 0.350 - 4.500 uIU/mL    Comment: Performed by a 3rd Generation assay with a functional sensitivity of <=0.01 uIU/mL. Performed  at [facility] [place] [address] [place], [place] [address]   Lipid panel     Status: None   Collection Time: 07/09/18  6:27 AM  Result Value Ref Range   Cholesterol 100 0 - 200 mg/dL   Triglycerides 60 <150 mg/dL   HDL 47 >40 mg/dL   Total CHOL/HDL Ratio 2.1 RATIO   VLDL 12 0 - 40 mg/dL   LDL Cholesterol 41 0 - 99 mg/dL    Comment:        Total Cholesterol/HDL:CHD Risk Coronary Heart Disease Risk Table                     Men   Women  1/2 Average Risk   3.4   3.3  Average Risk       5.0   4.4  2 X Average Risk   9.6   7.1  3 X Average Risk  23.4   11.0        Use the calculated Patient Ratio above and the CHD Risk Table to determine the patient's CHD Risk.        ATP III CLASSIFICATION (LDL):  <100     mg/dL   Optimal  100-129  mg/dL   Near or Above                    Optimal  130-159  mg/dL   Borderline  160-189  mg/dL   High  >190     mg/dL   Very High Performed at [place] [address], [place], [place] [address]   Basic metabolic panel     Status: Abnormal   Collection Time: 07/09/18  6:27 AM  Result Value Ref Range   Sodium 142 135 - 145 mmol/L   Potassium 3.3 (L) 3.5 - 5.1 mmol/L   Chloride 103 98 - 111 mmol/L    Comment: Please note change in reference range.   CO2 31 22 - 32 mmol/L   Glucose, Bld 91 70 - 99 mg/dL    Comment: Please note change in reference range.   BUN 12 6 - 20 mg/dL    Comment: Please note change in reference range.  Creatinine, Ser 0.71 0.44 - 1.00 mg/dL   Calcium 9.0 8.9 - 10.3 mg/dL   GFR calc non Af Amer >60 >60 mL/min   GFR calc Af Amer >60 >60 mL/min    Comment: (NOTE) The eGFR has been calculated using the CKD EPI equation. This calculation has not been validated in all clinical situations. eGFR's persistently <60 mL/min signify possible Chronic Kidney Disease.    Anion gap 8 5 - 15    Comment: Performed at [facility], [place] [address] [place], [place] [address]   CBC with Differential/Platelet     Status: None   Collection Time: 07/09/18  6:27 AM  Result Value Ref Range   WBC 5.9 4.0 - 10.5 K/uL   RBC 4.35 3.87 - 5.11 MIL/uL   Hemoglobin 12.8 12.0 - 15.0 g/dL   HCT 39.5 36.0 - 46.0 %   MCV 90.8 78.0 - 100.0 fL   MCH 29.4 26.0 - 34.0 pg   MCHC 32.4 30.0 - 36.0 g/dL   RDW 13.9 11.5 - 15.5 %   Platelets 242 150 - 400 K/uL   Neutrophils Relative % 37 %   Neutro Abs 2.2 1.7 - 7.7 K/uL   Lymphocytes Relative 49 %   Lymphs Abs 2.9 0.7 - 4.0 K/uL   Monocytes Relative 9 %   Monocytes Absolute 0.5 0.1 - 1.0 K/uL   Eosinophils Relative 4 %   Eosinophils Absolute 0.3 0.0 - 0.7 K/uL   Basophils Relative 1 %   Basophils Absolute 0.0 0.0 - 0.1 K/uL    Comment: Performed at [facility], [place] [address] [place], [place] [address]    Blood Alcohol level:  Lab Results  Component Value Date   [facility] <10 07/07/2018   ETH <11 81/82/9937    Metabolic Disorder Labs: Lab Results  Component Value Date   HGBA1C 5.7 (H) 02/08/2016   MPG 117 (H) 02/08/2016   MPG 108 04/13/2015   No results found for: PROLACTIN Lab Results  Component Value Date   CHOL 100 07/09/2018   TRIG 60 07/09/2018   HDL 47 07/09/2018   CHOLHDL 2.1 07/09/2018   VLDL 12 07/09/2018   LDLCALC 41 07/09/2018    Physical Findings: AIMS:  , ,  ,  ,    CIWA:    COWS:     Musculoskeletal: Strength & Muscle Tone: within normal limits Gait & Station: normal Patient leans: N/A  Psychiatric Specialty Exam: Physical Exam  ROS-denies chest pain, no shortness of breath, no vomiting  Blood pressure 94/66, pulse 94, temperature 98.3 F (36.8 C), temperature source Oral, resp. rate 18, height _0  (1.727 m), weight 46.3 kg (102 lb), SpO2 99 %, unknown if currently breastfeeding.Body mass index is 15.51 kg/m.  General Appearance: Improving grooming  Eye Contact:  Good  Speech:  Normal Rate  Volume:  Normal  Mood:  Less depressed, remains anxious  Affect:   Anxious, slightly constricted, improves during session  Thought Process:  Linear and Descriptions of Associations: Intact  Orientation:  Full (Time, Place, and Person)  Thought Content:  No hallucinations, no delusions  Suicidal Thoughts:  No-currently denies suicidal or self-injurious behaviors, does not endorse homicidal or violent ideations, is able to contract for safety at this time  Homicidal Thoughts:  No  Memory:  Recent and remote grossly intact  Judgement:  Other:  Improving  Insight:  Improving  Psychomotor Activity:  Normal  Concentration:  Concentration: Good and Attention Span: Good  Recall:  Good  Fund of Knowledge:  Good  Language:  Good  Akathisia:  Negative  Handed:  Right  AIMS (if indicated):     Assets:  Communication Skills Desire for Improvement Resilience  ADL's:  Intact  Cognition:  WNL  Sleep:  Number of Hours: 6.75   Assessment-patient presents with partially improved mood, today reports increased anxiety which she attributes to not being able to speak  (via phone) with 2 youngest children who are currently with their grandparents and still witnessing a peer having an episode of agitation earlier. She  responds well to reassurance/support/affect tends to improve during session. Denies medication side effects. Denies SI. Treatment Plan Summary: Daily contact with patient to assess and evaluate symptoms and progress in treatment, Medication management, Plan Inpatient admission and Medications as below Encourage group and milieu participation to work on coping skills and symptom reduction Continue Prozac 10 mg daily for depression and anxiety Increase Seroquel to 25 mg twice daily for mood and anxiety Continue Vistaril 25 mg every 6 hours as needed for anxiety as needed Continue Trazodone 25 mg nightly PRN for insomnia as needed Continue K+ supplementation. Continue to encourage [email or web address] . Jenne Campus, MD 07/09/2018, 5:51 PM

## 2018-07-09 NOTE — Plan of Care (Signed)
Patient verbalizes understanding of information, education provided. 

## 2018-07-09 NOTE — Therapy (Signed)
Occupational Therapy Group Note  Date:  07/09/2018 Time:  2:55 PM  Group Topic/Focus:  Stress Management  Participation Level:  Active  Participation Quality:  Appropriate  Affect:  Depressed and Flat  Cognitive:  Appropriate  Insight: Improving  Engagement in Group:  Engaged  Modes of Intervention:  Activity, Discussion, Education and Socialization  Additional Comments:    S: "Deep breathing does not really help me"  O: Stress management group completed to use as productive coping strategy, to help mitigate maladaptive coping to integrate in functional BADL/IADL when reintegrating into community.Stress management tools worksheet completed to identify negative coping mechanisms and their short and long term effects vs positive coping mechanisms with demonstration. Coping strategies taught include: relaxation based- deep breathing, counting to 10, taking a 1 minute vacation, acceptance, stress balls, relaxation audio/video, visual/mental imagery. Positive mental attitude- gratitude, acceptance, cognitive reframing, positive self talk, anger management.  Gratitude journaling handout and instruction also given. Adult coloring and relaxation tips worksheet given at end of session.   A: Pt presents to group with flat and depressed affect, engaged and participatory throughout session. Stress management tools worksheet completed, pt stating she often suppresses her stress until she acts out on other people. She notes this has strained relationships and often makes her feel guilty as a result. Pt states she would like to learn to schedule "me time" daily to deal with the life stressors of being a mother of 4. Pt eager to acquire handouts at end of session.  P: Pt provided with education on stress management activities to implement into daily routine. Handouts given to facilitate carryover when reintegrating into community     [facility] LLCKaylee Kimeka [name], [place] YorkMSOT, OTR/L  AvnetKaylee Violet [name] 07/09/2018, 2:55  PM

## 2018-07-09 NOTE — Progress Notes (Signed)
Pt presents with a flat affect and depressed mood. Pt expressed feeling depressed and wanting to get better, so that she can take care of her children. Pt stated, "I want to be able to get out of the bed and take my kids to the park". Pt denies SI/HI. Pt reports difficulty sleeping due to nightmares and racing thoughts. MD made aware of pt's complaint. Pt compliant with meds and denies any side effects.  Orders reviewed with pt. Verbal support provided. Pt attended treatment team. Pt encouraged to attend scheduled groups. Ensure provided to pt due to poor appetite. 15 minute checks performed for safety.  Pt compliant with tx. No concerns voiced by pt.

## 2018-07-09 NOTE — Progress Notes (Signed)
D: Patient observed up and restless on unit. Patient tearful, anxious and fidget early in shift stating she had attempting to speak to children via phone but "wasn't allowed by my family. I think they are doing this to punish me." Denies pain, physical complaints.   A: Medicated per orders, prn vistaril given for anxiety, trazadone for sleep. Medication education provided. EKG obtained and filed in paper chart. Indicates NSR. Level III obs in place for safety. Emotional support offered and patient encouraged to focus on self, refrain from phone calls, and get well for her children. Patient also encouraged to complete Suicide Safety Plan before discharge and to participate in unit programming.  Fall prevention plan in place and reviewed with patient as pt is a high fall risk due to hx of seizure.   R: Patient verbalizes understanding of POC, falls prevention education. On reassess, patient less anxious, able to rest. Patient denies SI/HI/AVH and remains safe on level III obs. Will continue to monitor throughout the night.

## 2018-07-09 NOTE — Progress Notes (Signed)
Recreation Therapy Notes  Date: 7.10.19 Time: 0930 Location: [address] Dayroom  Group Topic: Stress Management  Goal Area(s) Addresses:  Patient will verbalize importance of using healthy stress management.  Patient will identify positive emotions associated with healthy stress management.   Intervention: Stress Management  Activity :  Guided Imagery.  LRT introduced the stress management technique of guided imagery.  LRT read a script to allow patients to visualize being outside on a bright summer day.  Patients were to follow along as script was read.  Education:  Stress Management, Discharge Planning.   Education Outcome: Acknowledges edcuation/In group clarification offered/Needs additional education  Clinical Observations/Feedback: Pt did not attend group.    Edris Friedt, LRT/CTRS         Brogen Duell A 07/09/2018 12:01 PM 

## 2018-07-09 NOTE — Tx Team (Signed)
Interdisciplinary Treatment and Diagnostic Plan Update  07/09/2018 Time of Session: 1101 Kim CooleyJessica R [name] MRN: [id number]  Principal Diagnosis: MDD (major depressive disorder), recurrent severe, without psychosis (HCC)  Secondary Diagnoses: Principal Problem:   MDD (major depressive disorder), recurrent severe, without psychosis (HCC) Active Problems:   Bipolar depression (HCC)   Suicide attempt (HCC)   Current Medications:  Current Facility-Administered Medications  Medication Dose Route Frequency Provider Last Rate Last Dose  . acetaminophen (TYLENOL) tablet 650 mg  650 mg Oral Q6H PRN Thermon [name], Kim A, NP   650 mg at 07/08/18 1209  . alum & mag hydroxide-simeth (MAALOX/MYLANTA) 200-200-20 MG/5ML suspension 30 mL  30 mL Oral Q4H PRN Thermon [name], Kim A, NP      . feeding supplement (BOOST / RESOURCE BREEZE) liquid 1 Container  1 Container Oral Q24H Kim [name], Kim Lawson, MD   1 Container at 07/08/18 1501  . feeding supplement (ENSURE ENLIVE) (ENSURE ENLIVE) liquid 237 mL  237 mL Oral BID BM Kim [name], Kim Lawson, MD   237 mL at 07/09/18 1040  . FLUoxetine (PROZAC) capsule 10 mg  10 mg Oral Daily Cobos, Kim SituFernando A, MD   10 mg at 07/09/18 0754  . hydrOXYzine (ATARAX/VISTARIL) tablet 25 mg  25 mg Oral Q6H PRN Kim [name], Kim A, NP   25 mg at 07/08/18 1950  . magnesium hydroxide (MILK OF MAGNESIA) suspension 30 mL  30 mL Oral Daily PRN Kim [name], Kim A, NP      . multivitamin with minerals tablet 1 tablet  1 tablet Oral Daily Kim [name], Kim Lawson, MD   1 tablet at 07/09/18 0754  . nicotine (NICODERM CQ - dosed in mg/24 hours) patch 21 mg  21 mg Transdermal Daily Kim [name], Jason A, NP   21 mg at 07/09/18 0754  . [START ON 07/10/2018] potassium chloride SA (K-DUR,KLOR-CON) CR tablet 20 mEq  20 mEq Oral Once Kim [name], Kim Lawson, MD      . QUEtiapine (SEROQUEL) tablet 25 mg  25 mg Oral Daily Cobos, Kim SituFernando A, MD   25 mg at 07/09/18 0754  . traZODone (DESYREL) tablet 25 mg  25 mg Oral QHS PRN Cobos, Kim SituFernando A, MD   25  mg at 07/08/18 2113   PTA Medications: Medications Prior to Admission  Medication Sig Dispense Refill Last Dose  . nicotine (NICODERM CQ - DOSED IN MG/24 HOURS) 21 mg/24hr patch Place 21 mg onto the skin daily.   unknown  . ALPRAZolam (XANAX) 0.5 MG tablet Take 1 tablet (0.5 mg total) by mouth at bedtime as needed for anxiety. Take once [name] day by mouth as needed (Patient not taking: Reported on 07/08/2018) 30 tablet 3 Not Taking at Unknown time  . aspirin 81 MG chewable tablet Chew 1 tablet (81 mg total) by mouth daily. (Patient not taking: Reported on 07/08/2018) 90 tablet 3 Not Taking at Unknown time  . beclomethasone (QVAR) 40 MCG/ACT inhaler Inhale 2 puffs into the lungs 2 (two) times daily. (Patient not taking: Reported on 08/30/2016) 1 Inhaler 12 Not Taking at Unknown time  . clonazePAM (KLONOPIN) 0.5 MG tablet Take 0.5 mg by mouth 2 (two) times daily as needed for anxiety.   Not Taking at Unknown time  . cyclobenzaprine (FLEXERIL) 5 MG tablet Take 1 tablet (5 mg total) by mouth 3 (three) times daily as needed for muscle spasms. (Patient not taking: Reported on 07/08/2018) 15 tablet 0 Not Taking at Unknown time  . FLUoxetine (PROZAC) 40 MG capsule Take 40 mg by mouth daily.  Not Taking at Unknown time  . ondansetron (ZOFRAN ODT) 4 MG disintegrating tablet Take 1 tablet (4 mg total) by mouth every 8 (eight) hours as needed for nausea or vomiting. (Patient not taking: Reported on 07/08/2018) 15 tablet 0 Not Taking at Unknown time  . QUEtiapine Fumarate (SEROQUEL PO) Take 1 tablet by mouth daily.   Not Taking at Unknown time    Patient Stressors: Financial difficulties Marital or family conflict Medication change or noncompliance Substance abuse  Patient Strengths: Ability for insight Communication skills General fund of knowledge Supportive family/friends Work skills  Treatment Modalities: Medication Management, Group therapy, Case management,  1 to 1 session with clinician, Psychoeducation,  Recreational therapy.   Physician Treatment Plan for Primary Diagnosis: MDD (major depressive disorder), recurrent severe, without psychosis (HCC) Long Term Goal(s): Improvement in symptoms so as ready for discharge Improvement in symptoms so as ready for discharge   Short Term Goals: Ability to verbalize feelings will improve Ability to disclose and discuss suicidal ideas Compliance with prescribed medications will improve Ability to identify triggers associated with substance abuse/mental health issues will improve Ability to verbalize feelings will improve Ability to disclose and discuss suicidal ideas Ability to demonstrate self-control will improve Ability to identify and develop effective coping behaviors will improve  Medication Management: Evaluate patient's response, side effects, and tolerance of medication regimen.  Therapeutic Interventions: 1 to 1 sessions, Unit Group sessions and Medication administration.  Evaluation of Outcomes: Progressing  Physician Treatment Plan for Secondary Diagnosis: Principal Problem:   MDD (major depressive disorder), recurrent severe, without psychosis (HCC) Active Problems:   Bipolar depression (HCC)   Suicide attempt (HCC)  Long Term Goal(s): Improvement in symptoms so as ready for discharge Improvement in symptoms so as ready for discharge   Short Term Goals: Ability to verbalize feelings will improve Ability to disclose and discuss suicidal ideas Compliance with prescribed medications will improve Ability to identify triggers associated with substance abuse/mental health issues will improve Ability to verbalize feelings will improve Ability to disclose and discuss suicidal ideas Ability to demonstrate self-control will improve Ability to identify and develop effective coping behaviors will improve     Medication Management: Evaluate patient's response, side effects, and tolerance of medication regimen.  Therapeutic  Interventions: 1 to 1 sessions, Unit Group sessions and Medication administration.  Evaluation of Outcomes: Progressing   RN Treatment Plan for Primary Diagnosis: MDD (major depressive disorder), recurrent severe, without psychosis (HCC) Long Term Goal(s): Knowledge of disease and therapeutic regimen to maintain health will improve  Short Term Goals: Ability to identify and develop effective coping behaviors will improve and Compliance with prescribed medications will improve  Medication Management: RN will administer medications as ordered by provider, will assess and evaluate patient's response and provide education to patient for prescribed medication. RN will report any adverse and/or side effects to prescribing provider.  Therapeutic Interventions: 1 on 1 counseling sessions, Psychoeducation, Medication administration, Evaluate responses to treatment, Monitor vital signs and CBGs as ordered, Perform/monitor CIWA, COWS, AIMS and Fall Risk screenings as ordered, Perform wound care treatments as ordered.  Evaluation of Outcomes: Progressing   LCSW Treatment Plan for Primary Diagnosis: MDD (major depressive disorder), recurrent severe, without psychosis (HCC) Long Term Goal(s): Safe transition to appropriate next level of care at discharge, Engage patient in therapeutic group addressing interpersonal concerns.  Short Term Goals: Engage patient in aftercare planning with referrals and resources, Increase social support and Increase skills for wellness and recovery  Therapeutic Interventions: Assess for all  discharge needs, 1 to 1 time with Child psychotherapist, Explore available resources and support systems, Assess for adequacy in community support network, Educate family and significant other(s) on suicide prevention, Complete Psychosocial Assessment, Interpersonal group therapy.  Evaluation of Outcomes: Progressing   Progress in Treatment: Attending groups: No. Participating in groups:  No. Taking medication as prescribed: Yes. Toleration medication: Yes. Family/Significant other contact made: No, will contact:  pt declined consent Patient understands diagnosis: Yes. Discussing patient identified problems/goals with staff: Yes. Medical problems stabilized or resolved: Yes. Denies suicidal/homicidal ideation: Yes. Issues/concerns per patient self-inventory: No. Other: none  New problem(s) identified: No, Describe:  none  New Short Term/Long Term Goal(s):  Patient Goals:  "To get more stable so I can take care of my kids."  Discharge Plan or Barriers:   Reason for Continuation of Hospitalization: Depression Medication stabilization  Estimated Length of Stay: 2-4 days.  Attendees: Patient: Kim [name] 07/09/2018   Physician: Dr. Jama Flavors, MD 07/09/2018   Nursing: Liborio Nixon, RN 07/09/2018   RN Care Manager: 07/09/2018   Social Worker: Daleen Squibb, LCSW 07/09/2018   Recreational Therapist:  07/09/2018   Other:  07/09/2018   Other:  07/09/2018   Other: 07/09/2018        Scribe for Treatment Team: Lorri Frederick, LCSW 07/09/2018 1:15 PM

## 2018-07-09 NOTE — Progress Notes (Signed)
Adult Psychoeducational Group Note  Date:  07/09/2018 Time:  10:08 PM  Group Topic/Focus:  Wrap-Up Group:   The focus of this group is to help patients review their daily goal of treatment and discuss progress on daily workbooks.  Participation Level:  Active  Participation Quality:  Appropriate  Affect:  Flat  Cognitive:  Alert  Insight: Appropriate  Engagement in Group:  Limited  Modes of Intervention:  Discussion and Support  Additional Comments:  Pt was active in group. Pt goal for today was to be social. Pt said a positive about the day was that she got her clothes. Pt goal for tomorrow is to attend groups.   Ova FreshwaterChristina Darel Ricketts 07/09/2018, 10:08 PM

## 2018-07-09 NOTE — Progress Notes (Signed)
D: Patient denies SI, HI or AVH this evening. Patient is flat, depressed and anxious on approach.  She states that her goal for today was to work on getting out of her room stating, "I went to all of my groups".  Pt. Denies any physical complaints and states that her sleep and appetite are good.   A: Patient given emotional support from RN. Patient encouraged to come to staff with concerns and/or questions. Patient's medication routine continued. Patient's orders and plan of care reviewed.   R: Patient remains appropriate and cooperative. Will continue to monitor patient q15 minutes for safety.

## 2018-07-09 NOTE — Plan of Care (Signed)
  Problem: Health Behavior/Discharge Planning: Goal: Compliance with treatment plan for underlying cause of condition will improve Outcome: Progressing   Problem: Safety: Goal: Periods of time without injury will increase Outcome: Progressing   Problem: Activity: Goal: Sleeping patterns will improve Outcome: Not Progressing   Problem: Coping: Goal: Ability to demonstrate self-control will improve Outcome: Not Progressing Pt noted to be fidgety and worried after a pt eloped from the courtyard during rec therapy this evening. Pt stated, "he's going to kill me". "You don't understand, he was talking to us". Writer tried to distract pt thoughts by using coping strategies but pt was unable to focus. Pt had a difficult time settling down and required prn med for anxiety.

## 2018-07-10 DIAGNOSIS — F1290 Cannabis use, unspecified, uncomplicated: Secondary | ICD-10-CM

## 2018-07-10 LAB — HEMOGLOBIN A1C
Hgb A1c MFr Bld: 5.5 % (ref 4.8–5.6)
MEAN PLASMA GLUCOSE: 111 mg/dL

## 2018-07-10 MED ORDER — TRAZODONE HCL 50 MG PO TABS
50.0000 mg | ORAL_TABLET | Freq: Every evening | ORAL | Status: DC | PRN
  Administered 2018-07-10: 50 mg via ORAL
  Filled 2018-07-10: qty 1
  Filled 2018-07-10: qty 7

## 2018-07-10 MED ORDER — FLUOXETINE HCL 20 MG PO CAPS
20.0000 mg | ORAL_CAPSULE | Freq: Every day | ORAL | Status: DC
  Administered 2018-07-11: 20 mg via ORAL
  Filled 2018-07-10: qty 7
  Filled 2018-07-10 (×2): qty 1

## 2018-07-10 MED ORDER — QUETIAPINE FUMARATE 50 MG PO TABS
50.0000 mg | ORAL_TABLET | Freq: Every day | ORAL | Status: DC
  Administered 2018-07-10: 50 mg via ORAL
  Filled 2018-07-10 (×3): qty 1

## 2018-07-10 MED ORDER — QUETIAPINE FUMARATE 25 MG PO TABS
25.0000 mg | ORAL_TABLET | ORAL | Status: DC
  Administered 2018-07-11: 25 mg via ORAL
  Filled 2018-07-10: qty 1
  Filled 2018-07-10: qty 21
  Filled 2018-07-10: qty 1

## 2018-07-10 NOTE — Progress Notes (Signed)
Adult Psychoeducational Group Note  Date:  07/10/2018 Time:  8:43 PM  Group Topic/Focus:  Wrap-Up Group:   The focus of this group is to help patients review their daily goal of treatment and discuss progress on daily workbooks.  Participation Level:  Active  Participation Quality:  Appropriate  Affect:  Appropriate  Cognitive:  Alert and Oriented  Insight: Appropriate  Engagement in Group:  Improving  Modes of Intervention:  Exploration and Support  Additional Comments:  Patient verbalized that she had a really good day and that she rated her day an 8. Pt verbalized that one coping skill she has been able to use is the rubber band technique. Pt verbalized that something positive is that she was able to talk to her kids and that she is currently working on her discharge plan. Pt verbalized that she was able to learn how to walk away from situations.  Xavier Fournier, Randal Bubaerri Lee 07/10/2018, 8:43 PM

## 2018-07-10 NOTE — [facility] Group Notes (Signed)
[facility] LCSW Group Therapy Note  Date/Time: 07/10/18, 1315  Type of Therapy/Topic:  Group Therapy:  Balance in Life  Participation Level:  active  Description of Group:    This group will address the concept of balance and how it feels and looks when one is unbalanced. Patients will be encouraged to process areas in their lives that are out of balance, and identify reasons for remaining unbalanced. Facilitators will guide patients utilizing problem- solving interventions to address and correct the stressor making their life unbalanced. Understanding and applying boundaries will be explored and addressed for obtaining  and maintaining a balanced life. Patients will be encouraged to explore ways to assertively make their unbalanced needs known to significant others in their lives, using other group members and facilitator for support and feedback.  Therapeutic Goals: 1. Patient will identify two or more emotions or situations they have that consume much of in their lives. 2. Patient will identify signs/triggers that life has become out of balance:  3. Patient will identify two ways to set boundaries in order to achieve balance in their lives:  4. Patient will demonstrate ability to communicate their needs through discussion and/or role plays  Summary of Patient Progress:Pt shared that financial and family are the areas that are currently out of balance in her life.  Pt active in group discussion about ways to recognized and respond to areas of life that get out of balance.           Therapeutic Modalities:   Cognitive Behavioral Therapy Solution-Focused Therapy Assertiveness Training  Daleen SquibbGreg Holden Draughon, [place]

## 2018-07-10 NOTE — Progress Notes (Addendum)
[facility] MD Progress Note  07/10/2018 2:05 PM Kim [name]  MRN:  [id number] Subjective: Patient describes improving mood and decreased anxiety today.  At present denies suicidal ideations.  States that she has felt better partly because she was able to speak with her children on the phone earlier today. Describes poor sleep, which she attributes partially to increased anxiety yesterday. Currently denies medication side effects.   Denies suicidal ideations. Objective: I have discussed case with treatment team and have met with patient. She is a 28 year old female who presented to hospital due to worsening depression, neurovegetative symptoms .  She endorses history of bipolar spectrum disorder. Patient is presenting with partially improved mood and fuller range of affect.  Remains vaguely anxious but better today, and seems less ruminative at this time.  Currently tolerating medications well. Denies medication side effects.  She has been visible on unit and attending groups Reports appetite is improving, eating better, denies any vomiting.  Principal Problem:Depression Diagnosis:   Patient Active Problem List   Diagnosis Date Noted  . MDD (major depressive disorder), recurrent severe, without psychosis ([place]) [F33.2] 07/08/2018  . Suicide attempt ([place]) [T14.91XA] 07/08/2018  . Asthma, mild [J45.909] 07/07/2018  . Cannabis abuse [F12.10] 07/07/2018  . Ileus, postoperative ([place]) [K91.89, K56.7] 09/10/2016  . Status post tubal ligation [Z98.51] 09/10/2016  . Status post repeat low transverse cesarean section [Z98.891] 09/05/2016  . Anemia of mother in pregnancy, antepartum [O99.019] 06/29/2016  . GBS bacteriuria [R82.71] 04/06/2016  . Asthma affecting pregnancy, antepartum [O99.519, J45.909] 02/08/2016  . Short interval between pregnancies affecting pregnancy, antepartum [O09.899] 02/08/2016  . Previous cesarean section complicating pregnancy, antepartum condition or complication [A19.379]  02/40/9735  . H/O pre-eclampsia in prior pregnancy, currently pregnant [O09.299] 12/14/2014  . Asthma, moderate persistent, well-controlled [J45.40] 12/14/2014  . Tobacco smoking affecting pregnancy, antepartum [O99.330] 12/14/2014  . Bipolar depression ([place]) [F31.9] 12/14/2014  . Supervision of normal pregnancy [Z34.90] 11/16/2014  . History of diabetes as a child and gestational diabetes [Z86.39] 11/16/2014  . Underweight [R63.6] 11/16/2014   Total Time spent with patient: 20 minutes  Past Psychiatric History:  Past Medical History:  Past Medical History:  Diagnosis Date  . Anemia   . Anxiety   . Asthma   . Chest pain   . Depression   . Fibromyalgia   . Gestational diabetes 2010, 2012  . Heartburn during pregnancy   . Rectal bleeding   . Seizures ([place]) 2015   x 1- unknown reason- passed out  . Ulcer     Past Surgical History:  Procedure Laterality Date  . CESAREAN SECTION     x 2  . CESAREAN SECTION WITH BILATERAL TUBAL LIGATION Bilateral 07/03/2015   Procedure: REPEAT CESAREAN SECTION ;  Surgeon: Osborne Oman, MD;  Location: [facility];  Service: Obstetrics;  Laterality: Bilateral;  . CESAREAN SECTION WITH BILATERAL TUBAL LIGATION Bilateral 09/05/2016   Procedure: REPEAT CESAREAN SECTION WITH BILATERAL TUBAL LIGATION;  Surgeon: Aletha Halim, MD;  Location: [place];  Service: Obstetrics;  Laterality: Bilateral;  . COLONOSCOPY  04/16/2008  . WISDOM TOOTH EXTRACTION     Family History:  Family History  Problem Relation Age of Onset  . Depression Mother   . Fibromyalgia Mother   . Cancer Mother        breast  . Diabetes Maternal Grandmother   . Hypertension Maternal Grandmother   . Arthritis Maternal Grandmother   . Thyroid disease Maternal Grandmother    Family Psychiatric  History:  Social History:  Social History   Substance and Sexual Activity  Alcohol Use No     Social History   Substance and Sexual Activity  Drug Use Yes  . Types:  Marijuana   Comment: not while pregnant    Social History   Socioeconomic History  . Marital status: Divorced    Spouse name: Not on file  . Number of children: Not on file  . Years of education: Not on file  . Highest education level: Not on file  Occupational History  . Not on file  Social Needs  . Financial resource strain: Not on file  . Food insecurity:    Worry: Not on file    Inability: Not on file  . Transportation needs:    Medical: Not on file    Non-medical: Not on file  Tobacco Use  . Smoking status: Current Every Day Smoker    Packs/day: 0.50    Types: Cigarettes  . Smokeless tobacco: Former Systems developer    Quit date: 08/24/2014  Substance and Sexual Activity  . Alcohol use: No  . Drug use: Yes    Types: Marijuana    Comment: not while pregnant  . Sexual activity: Yes    Birth control/protection: None  Lifestyle  . Physical activity:    Days per week: Not on file    Minutes per session: Not on file  . Stress: Not on file  Relationships  . Social connections:    Talks on phone: Not on file    Gets together: Not on file    Attends religious service: Not on file    Active member of club or organization: Not on file    Attends meetings of clubs or organizations: Not on file    Relationship status: Not on file  Other Topics Concern  . Not on file  Social History Narrative  . Not on file   Additional Social History:   Sleep: Fair  Appetite:  Good  Current Medications: Current Facility-Administered Medications  Medication Dose Route Frequency Provider Last Rate Last Dose  . acetaminophen (TYLENOL) tablet 650 mg  650 mg Oral Q6H PRN Niel Hummer, NP   650 mg at 07/10/18 6283  . alum & mag hydroxide-simeth (MAALOX/MYLANTA) 200-200-20 MG/5ML suspension 30 mL  30 mL Oral Q4H PRN Niel Hummer, NP      . feeding supplement (BOOST / RESOURCE BREEZE) liquid 1 Container  1 Container Oral Q24H Sharma Covert, MD   1 Container at 07/09/18 1542  . feeding  supplement (ENSURE ENLIVE) (ENSURE ENLIVE) liquid 237 mL  237 mL Oral BID BM Sharma Covert, MD   237 mL at 07/10/18 1036  . [START ON 07/11/2018] FLUoxetine (PROZAC) capsule 20 mg  20 mg Oral Daily Gustave Lindeman A, MD      . hydrOXYzine (ATARAX/VISTARIL) tablet 25 mg  25 mg Oral Q6H PRN Elmarie Shiley A, NP   25 mg at 07/10/18 1150  . magnesium hydroxide (MILK OF MAGNESIA) suspension 30 mL  30 mL Oral Daily PRN Elmarie Shiley A, NP      . multivitamin with minerals tablet 1 tablet  1 tablet Oral Daily Sharma Covert, MD   1 tablet at 07/10/18 0807  . nicotine (NICODERM CQ - dosed in mg/24 hours) patch 21 mg  21 mg Transdermal Daily Lindon Romp A, NP   21 mg at 07/10/18 0746  . potassium chloride SA (K-DUR,KLOR-CON) CR tablet 20 mEq  20 mEq Oral BID  Edon Hoadley, Myer Peer, MD   20 mEq at 07/10/18 0807  . [START ON 07/11/2018] QUEtiapine (SEROQUEL) tablet 25 mg  25 mg Oral BH-q7a Lashundra Shiveley A, MD      . QUEtiapine (SEROQUEL) tablet 50 mg  50 mg Oral QHS Navie Lamoreaux A, MD      . traZODone (DESYREL) tablet 50 mg  50 mg Oral QHS PRN Kristine Chahal, Myer Peer, MD        Lab Results:  Results for orders placed or performed during the hospital encounter of 07/07/18 (from the past 48 hour(s))  TSH     Status: None   Collection Time: 07/09/18  6:27 AM  Result Value Ref Range   TSH 2.292 0.350 - 4.500 uIU/mL    Comment: Performed by a 3rd Generation assay with a functional sensitivity of <=0.01 uIU/mL. Performed at [facility], [place] [address] [place], [place] [address]   Hemoglobin A1c     Status: None   Collection Time: 07/09/18  6:27 AM  Result Value Ref Range   Hgb A1c MFr Bld 5.5 4.8 - 5.6 %    Comment: (NOTE)         Prediabetes: 5.7 - 6.4         Diabetes: >6.4         Glycemic control for adults with diabetes: <7.0    Mean Plasma Glucose 111 mg/dL    Comment: (NOTE) Performed At: [facility] [place], [place] [id number] Rush Farmer MD  CB:[id number]   Lipid panel     Status: None   Collection Time: 07/09/18  6:27 AM  Result Value Ref Range   Cholesterol 100 0 - 200 mg/dL   Triglycerides 60 <150 mg/dL   HDL 47 >40 mg/dL   Total CHOL/HDL Ratio 2.1 RATIO   VLDL 12 0 - 40 mg/dL   LDL Cholesterol 41 0 - 99 mg/dL    Comment:        Total Cholesterol/HDL:CHD Risk Coronary Heart Disease Risk Table                     Men   Women  1/2 Average Risk   3.4   3.3  Average Risk       5.0   4.4  2 X Average Risk   9.6   7.1  3 X Average Risk  23.4   11.0        Use the calculated Patient Ratio above and the CHD Risk Table to determine the patient's CHD Risk.        ATP III CLASSIFICATION (LDL):  <100     mg/dL   Optimal  100-129  mg/dL   Near or Above                    Optimal  130-159  mg/dL   Borderline  160-189  mg/dL   High  >190     mg/dL   Very High Performed at [facility] [address] [place], [place] [address]   Basic metabolic panel     Status: Abnormal   Collection Time: 07/09/18  6:27 AM  Result Value Ref Range   Sodium 142 135 - 145 mmol/L   Potassium 3.3 (L) 3.5 - 5.1 mmol/L   Chloride 103 98 - 111 mmol/L    Comment: Please note change in reference range.   CO2 31 22 - 32 mmol/L   Glucose, Bld 91  70 - 99 mg/dL    Comment: Please note change in reference range.   BUN 12 6 - 20 mg/dL    Comment: Please note change in reference range.   Creatinine, Ser 0.71 0.44 - 1.00 mg/dL   Calcium 9.0 8.9 - 10.3 mg/dL   GFR calc non Af Amer >60 >60 mL/min   GFR calc Af Amer >60 >60 mL/min    Comment: (NOTE) The eGFR has been calculated using the CKD EPI equation. This calculation has not been validated in all clinical situations. eGFR's persistently <60 mL/min signify possible Chronic Kidney Disease.    Anion gap 8 5 - 15    Comment: Performed at [facility], [place] [address] [place], [place] [address]  CBC with Differential/Platelet     Status: None   Collection  Time: 07/09/18  6:27 AM  Result Value Ref Range   WBC 5.9 4.0 - 10.5 K/uL   RBC 4.35 3.87 - 5.11 MIL/uL   Hemoglobin 12.8 12.0 - 15.0 g/dL   HCT 39.5 36.0 - 46.0 %   MCV 90.8 78.0 - 100.0 fL   MCH 29.4 26.0 - 34.0 pg   MCHC 32.4 30.0 - 36.0 g/dL   RDW 13.9 11.5 - 15.5 %   Platelets 242 150 - 400 K/uL   Neutrophils Relative % 37 %   Neutro Abs 2.2 1.7 - 7.7 K/uL   Lymphocytes Relative 49 %   Lymphs Abs 2.9 0.7 - 4.0 K/uL   Monocytes Relative 9 %   Monocytes Absolute 0.5 0.1 - 1.0 K/uL   Eosinophils Relative 4 %   Eosinophils Absolute 0.3 0.0 - 0.7 K/uL   Basophils Relative 1 %   Basophils Absolute 0.0 0.0 - 0.1 K/uL    Comment: Performed at [facility], [place] [address] [place], [place] [address]    Blood Alcohol level:  Lab Results  Component Value Date   [facility] <10 07/07/2018   ETH <11 42/35/3614    Metabolic Disorder Labs: Lab Results  Component Value Date   HGBA1C 5.5 07/09/2018   MPG 111 07/09/2018   MPG 117 (H) 02/08/2016   No results found for: PROLACTIN Lab Results  Component Value Date   CHOL 100 07/09/2018   TRIG 60 07/09/2018   HDL 47 07/09/2018   CHOLHDL 2.1 07/09/2018   VLDL 12 07/09/2018   LDLCALC 41 07/09/2018    Physical Findings: AIMS:  , ,  ,  ,    CIWA:    COWS:     Musculoskeletal: Strength & Muscle Tone: within normal limits Gait & Station: normal Patient leans: N/A  Psychiatric Specialty Exam: Physical Exam  ROS-denies chest pain, no shortness of breath, no vomiting  Blood pressure 131/73, pulse (!) 124, temperature 98.2 F (36.8 C), temperature source Oral, resp. rate 16, height _0  (1.727 m), weight 46.3 kg (102 lb), SpO2 99 %, unknown if currently breastfeeding.Body mass index is 15.51 kg/m.  General Appearance: Improving grooming  Eye Contact:  Good  Speech:  Normal Rate  Volume:  Normal  Mood:  Gradually improving mood  Affect:  Less anxious today, smiles at times appropriately  Thought Process:  Linear  and Descriptions of Associations: Intact  Orientation:  Full (Time, Place, and Person)  Thought Content:  No hallucinations, no delusions  Suicidal Thoughts:  No-currently denies suicidal or self-injurious behaviors, does not endorse homicidal or violent ideations, is able to contract for safety at this time  Homicidal Thoughts:  No  Memory:  Recent and remote grossly intact  Judgement:  Other:  Improving  Insight:  Improving  Psychomotor Activity:  Normal  Concentration:  Concentration: Good and Attention Span: Good  Recall:  Good  Fund of Knowledge:  Good  Language:  Good  Akathisia:  Negative  Handed:  Right  AIMS (if indicated):     Assets:  Communication Skills Desire for Improvement Resilience  ADL's:  Intact  Cognition:  WNL  Sleep:  Number of Hours: 6.75   Assessment-patient is presenting with gradually improving mood, acknowledges feeling better and less depressed today.  Denies suicidal ideations at this time, identifies love for her children as a protective factor against hurting herself.  Yesterday had presented significantly anxious, seems less so today.  Her major concern at this time is insomnia, states she slept poorly last night. Denies medication side effects. Denies suicidal ideations. Treatment Plan Summary: Daily contact with patient to assess and evaluate symptoms and progress in treatment, Medication management, Plan Inpatient admission and Medications as below  Treatment plan reviewed as below today July 11 Encourage group and milieu participation to work on Radiographer, therapeutic and symptom reduction Continue Prozac 10 mg daily for depression and anxiety Increase Seroquel to 25 mg QAM and 50 mgrs QHS  for mood and anxiety Continue Vistaril 25 mg every 6 hours as needed for anxiety as needed Increase  Trazodone to 50 mg QHS PRN for insomnia as needed Continue to encourage [email or web address] . Recheck BMP . Treatment team working on disposition planning.  Jenne Campus, MD 07/10/2018, 2:05 PM  Patient ID: Kim [name], female   DOB: 1990-02-19, 28 y.o.   MRN: [id number]

## 2018-07-10 NOTE — [facility] Group Notes (Signed)
[facility] Group Notes:  (Nursing/MHT/Case Management/Adjunct)  Date:  07/10/2018  Time:  4:58 PM  Type of Therapy:  Psychoeducational Skills  Participation Level:  Active  Participation Quality:  Appropriate  Affect:  Appropriate  Cognitive:  Alert and Appropriate  Insight:  Appropriate  Engagement in Group:  Engaged  Modes of Intervention:  Activity  Summary of Progress/Problems:  Patient actively participated and was receptive.  Kim [name] 07/10/2018, 4:58 PM

## 2018-07-10 NOTE — Plan of Care (Signed)
  Problem: Education: Goal: Emotional status will improve Outcome: Progressing   Problem: Activity: Goal: Interest or engagement in activities will improve Outcome: Progressing   Problem: Safety: Goal: Periods of time without injury will increase Outcome: Progressing   Problem: Medication: Goal: Compliance with prescribed medication regimen will improve Outcome: Progressing DAR NOTE: Patient presents with anxious affect and mood.  Denies suicidal thoughts, auditory and visual hallucinations.  Rates depression at 2, hopelessness at 2, and anxiety at 4.  Maintained on routine safety checks.  Medications given as prescribed.  Support and encouragement offered as needed.  Attended group and participated.  States goal for today is "to continue to attend groups and stay out of my room."  Patient observed socializing with peers in the dayroom.  Offered no complaint.

## 2018-07-10 NOTE — Plan of Care (Signed)
  Problem: Coping: Goal: Ability to verbalize frustrations and anger appropriately will improve Outcome: Progressing   Problem: Safety: Goal: Periods of time without injury will increase Outcome: Progressing   Problem: Medication: Goal: Compliance with prescribed medication regimen will improve Outcome: Progressing   

## 2018-07-11 LAB — BASIC METABOLIC PANEL
ANION GAP: 7 (ref 5–15)
BUN: 15 mg/dL (ref 6–20)
CHLORIDE: 104 mmol/L (ref 98–111)
CO2: 30 mmol/L (ref 22–32)
Calcium: 9.5 mg/dL (ref 8.9–10.3)
Creatinine, Ser: 0.67 mg/dL (ref 0.44–1.00)
GFR calc Af Amer: >60 mL/min (ref >60)
GFR calc non Af Amer: >60 mL/min (ref >60)
GLUCOSE: 96 mg/dL (ref 70–99)
Potassium: 4.3 mmol/L (ref 3.5–5.1)
Sodium: 141 mmol/L (ref 135–145)

## 2018-07-11 MED ORDER — QUETIAPINE FUMARATE 25 MG PO TABS: 25.0000 mg | ORAL_TABLET | ORAL | 0 refills | Status: DC

## 2018-07-11 MED ORDER — FLUOXETINE HCL 20 MG PO CAPS: 20.0000 mg | ORAL_CAPSULE | Freq: Every day | ORAL | 0 refills | Status: DC

## 2018-07-11 MED ORDER — QUETIAPINE FUMARATE 50 MG PO TABS: 50.0000 mg | ORAL_TABLET | Freq: Every day | ORAL | 0 refills | Status: DC

## 2018-07-11 MED ORDER — HYDROXYZINE HCL 25 MG PO TABS: 25.0000 mg | ORAL_TABLET | Freq: Four times a day (QID) | ORAL | 0 refills | Status: DC | PRN

## 2018-07-11 MED ORDER — TRAZODONE HCL 50 MG PO TABS: 50.0000 mg | ORAL_TABLET | Freq: Every evening | ORAL | 0 refills | Status: DC | PRN

## 2018-07-11 NOTE — Plan of Care (Signed)
  Problem: Education: Goal: Emotional status will improve Outcome: Completed/Met Goal: Mental status will improve Outcome: Completed/Met   Problem: Activity: Goal: Interest or engagement in activities will improve Outcome: Completed/Met Goal: Sleeping patterns will improve Outcome: Completed/Met   Problem: Coping: Goal: Ability to verbalize frustrations and anger appropriately will improve Outcome: Completed/Met Goal: Ability to demonstrate self-control will improve Outcome: Completed/Met   Problem: Health Behavior/Discharge Planning: Goal: Identification of resources available to assist in meeting health care needs will improve Outcome: Completed/Met Goal: Compliance with treatment plan for underlying cause of condition will improve Outcome: Completed/Met   Problem: Physical Regulation: Goal: Ability to maintain clinical measurements within normal limits will improve Outcome: Completed/Met   Problem: Safety: Goal: Periods of time without injury will increase Outcome: Completed/Met   Problem: Education: Goal: Ability to make informed decisions regarding treatment will improve Outcome: Completed/Met   Problem: Coping: Goal: Coping ability will improve Outcome: Completed/Met   Problem: Health Behavior/Discharge Planning: Goal: Identification of resources available to assist in meeting health care needs will improve Outcome: Completed/Met   Problem: Medication: Goal: Compliance with prescribed medication regimen will improve Outcome: Completed/Met   Problem: Self-Concept: Goal: Ability to disclose and discuss suicidal ideas will improve Outcome: Completed/Met Goal: Will verbalize positive feelings about self Outcome: Completed/Met   Problem: Education: Goal: Ability to state activities that reduce stress will improve Outcome: Completed/Met   Problem: Coping: Goal: Ability to identify and develop effective coping behavior will improve Outcome: Completed/Met    Problem: Self-Concept: Goal: Ability to identify factors that promote anxiety will improve Outcome: Completed/Met Goal: Level of anxiety will decrease Outcome: Completed/Met Goal: Ability to modify response to factors that promote anxiety will improve Outcome: Completed/Met   Problem: Education: Goal: Utilization of techniques to improve thought processes will improve Outcome: Completed/Met Goal: Knowledge of the prescribed therapeutic regimen will improve Outcome: Completed/Met   Problem: Activity: Goal: Interest or engagement in leisure activities will improve Outcome: Completed/Met Goal: Imbalance in normal sleep/wake cycle will improve Outcome: Completed/Met   Problem: Coping: Goal: Coping ability will improve Outcome: Completed/Met Goal: Will verbalize feelings Outcome: Completed/Met   Problem: Health Behavior/Discharge Planning: Goal: Ability to make decisions will improve Outcome: Completed/Met Goal: Compliance with therapeutic regimen will improve Outcome: Completed/Met   Problem: Role Relationship: Goal: Will demonstrate positive changes in social behaviors and relationships Outcome: Completed/Met   Problem: Safety: Goal: Ability to disclose and discuss suicidal ideas will improve Outcome: Completed/Met Goal: Ability to identify and utilize support systems that promote safety will improve Outcome: Completed/Met   Problem: Self-Concept: Goal: Will verbalize positive feelings about self Outcome: Completed/Met Goal: Level of anxiety will decrease Outcome: Completed/Met

## 2018-07-11 NOTE — Progress Notes (Signed)
DAR NOTE: Patient presents with calm affect and bright mood. Pt in the dayroom at the begging of the shift interacting with peers. Pt stated her day was good, did not complain of stomach ache. Denies pain, auditory and visual hallucinations.  Maintained on routine safety checks.  Medications given as prescribed.  Support and encouragement offered as needed. Pt's safety ensured with 15 minute and environmental checks. Pt verbally agrees to seek staff if SI/HI or A/VH occurs and to consult with staff before acting on these thoughts. Will continue POC.

## 2018-07-11 NOTE — BHH Suicide Risk Assessment (Addendum)
[facility] Discharge Suicide Risk Assessment   Principal Problem: MDD (major depressive disorder), recurrent severe, without psychosis (HCC) Discharge Diagnoses:  Patient Active Problem List   Diagnosis Date Noted  . MDD (major depressive disorder), recurrent severe, without psychosis (HCC) [F33.2] 07/08/2018  . Suicide attempt (HCC) [T14.91XA] 07/08/2018  . Asthma, mild [J45.909] 07/07/2018  . Cannabis abuse [F12.10] 07/07/2018  . Ileus, postoperative (HCC) [K91.89, K56.7] 09/10/2016  . Status post tubal ligation [Z98.51] 09/10/2016  . Status post repeat low transverse cesarean section [Z98.891] 09/05/2016  . Anemia of mother in pregnancy, antepartum [O99.019] 06/29/2016  . GBS bacteriuria [R82.71] 04/06/2016  . Asthma affecting pregnancy, antepartum [O99.519, J45.909] 02/08/2016  . Short interval between pregnancies affecting pregnancy, antepartum [O09.899] 02/08/2016  . Previous cesarean section complicating pregnancy, antepartum condition or complication [O34.219] 12/14/2014  . H/O pre-eclampsia in prior pregnancy, currently pregnant [O09.299] 12/14/2014  . Asthma, moderate persistent, well-controlled [J45.40] 12/14/2014  . Tobacco smoking affecting pregnancy, antepartum [O99.330] 12/14/2014  . Bipolar depression (HCC) [F31.9] 12/14/2014  . Supervision of normal pregnancy [Z34.90] 11/16/2014  . History of diabetes as a child and gestational diabetes [Z86.39] 11/16/2014  . Underweight [R63.6] 11/16/2014    Total Time spent with patient: 30 minutes  Musculoskeletal: Strength & Muscle Tone: within normal limits Gait & Station: normal Patient leans: N/A  Psychiatric Specialty Exam: ROSno headache, no chest pain, no shortness of breath, no vomiting , no diarrhea, denies dizziness or lightheadedness   Blood pressure 98/75, pulse (!) 114, temperature 98 F (36.7 C), temperature source Oral, resp. rate 12, height 5\' 8"  (1.727 m), weight 46.3 kg (102 lb), SpO2 99 %, unknown if currently  breastfeeding.Body mass index is 15.51 kg/m.  General Appearance: improving grooming   Eye Contact::  Good  Speech:  Normal Rate409  Volume:  Normal  Mood:  reports she feels better, less depressed., describes mood as 8/10  Affect:  more reactive, brighter   Thought Process:  Linear and Descriptions of Associations: Intact  Orientation:  Full (Time, Place, and Person)  Thought Content:  no hallucinations, no delusions, not internally preoccupied   Suicidal Thoughts:  No denies any suicidal or self injurious ideations, no homicidal or violent ideations   Homicidal Thoughts:  No  Memory:  recent and remote grossly intact   Judgement:  Other:  improving   Insight:  improving   Psychomotor Activity:  Normal  Concentration:  Good  Recall:  Good  Fund of Knowledge:Good  Language: Good  Akathisia:  Negative  Handed:  Right  AIMS (if indicated):   no abnormal movements noted or reported   Assets:  Communication Skills Desire for Improvement Resilience  Sleep:  Number of Hours: 5.75  Cognition: WNL  ADL's:  Intact   Mental Status Per Nursing Assessment::   On Admission:  Suicidal ideation indicated by patient, Suicide plan  Demographic Factors:  28 year old , divorced, 4 children, lives with a friend  Loss Factors: States " I was trying to make everyone else happy and not taking care of myself properly"  Historical Factors: No prior psychiatric admissions, no history of suicide attempts , history of Bipolar Disorder , PTSD diagnosis  Risk Reduction Factors:   Responsible for children under 45 years of age, Living with another person, especially a relative and Positive coping skills or problem solving skills  Continued Clinical Symptoms:  Alert , attentive, well related, mood improved and currently states " I feel a lot better", affect appropriate and reactive, brighter, no thought disorder, no  suicidal or self injurious ideations, no homicidal or violent ideations, no  hallucinations,no delusions, not internally preoccupied , future oriented . Denies medication side effects, side effects discussed  Behavior on unit calm , in good control, pleasant on approach  Cognitive Features That Contribute To Risk:  No gross cognitive deficits noted upon discharge. Is alert , attentive, and oriented x 3   Suicide Risk:  Mild:  Suicidal ideation of limited frequency, intensity, duration, and specificity.  There are no identifiable plans, no associated intent, mild dysphoria and related symptoms, good self-control (both objective and subjective assessment), few other risk factors, and identifiable protective factors, including available and accessible social support.  Follow-up Information    [facility] Go on 07/14/2018.   Why:  Please attend your appt on Monday, 07/14/18, at 12:30pm.   Contact information: [address] [place] [address] [phone]           Plan Of Care/Follow-up recommendations:  Activity:  as tolerated  Diet:  Regular Tests:  NA Other:  See below  Patient is expressing readiness for discharge and is leaving unit in good spirits  Plans to return home Plans to follow up as above   Craige CottaFernando A Cobos, MD 07/11/2018, 8:22 AM

## 2018-07-11 NOTE — Progress Notes (Signed)
Nutrition Education Note  Pt attended group focusing on general, healthful nutrition education.  RD emphasized the importance of eating regular meals and snacks throughout the day. Consuming sugar-free beverages and incorporating fruits and vegetables into diet when possible. Provided examples of healthy snacks. Patient encouraged to leave group with a goal to improve nutrition/healthy eating.   Diet Order:  Diet Order            Diet regular Room service appropriate? No; Fluid consistency: Thin  Diet effective now             Pt is also offered choice of unit snacks mid-morning and mid-afternoon.  Pt is eating as desired.   If additional nutrition issues arise, please consult RD.    Renae Siren Porrata, MS, RD, LDN, CNSC Inpatient Clinical Dietitian Pager # [phone] After hours/weekend pager # [phone]    
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
## 2018-07-11 NOTE — [facility] Group Notes (Signed)
[facility] LCSW Group Therapy Note  Date/Time: 07/11/18, 1315  Type of Therapy/Topic:  Group Therapy:  Feelings about Diagnosis  Participation Level:  Active   Mood:pleasant   Description of Group:    This group will allow patients to explore their thoughts and feelings about diagnoses they have received. Patients will be guided to explore their level of understanding and acceptance of these diagnoses. Facilitator will encourage patients to process their thoughts and feelings about the reactions of others to their diagnosis, and will guide patients in identifying ways to discuss their diagnosis with significant others in their lives. This group will be process-oriented, with patients participating in exploration of their own experiences as well as giving and receiving support and challenge from other group members.   Therapeutic Goals: 1. Patient will demonstrate understanding of diagnosis as evidence by identifying two or more symptoms of the disorder:  2. Patient will be able to express two feelings regarding the diagnosis 3. Patient will demonstrate ability to communicate their needs through discussion and/or role plays  Summary of Patient Progress: Pt active participant in group discussion regarding the above topic.  Pt shared examples and was engaged and interacted with other patients.          Therapeutic Modalities:   Cognitive Behavioral Therapy Brief Therapy Feelings Identification   Greg Panda Crossin, LCSW 

## 2018-07-11 NOTE — Progress Notes (Signed)
Recreation Therapy Notes  Date: 7.12.19 Time: 0930 Location: [address] Dayroom  Group Topic: Stress Management  Goal Area(s) Addresses:  Patient will verbalize importance of using healthy stress management.  Patient will identify positive emotions associated with healthy stress management.   Intervention: Stress Management  Activity :  Meditation.  LRT introduced the stress management technique of meditation.  LRT played a meditation on being resilient.  Patients were to listen to the meditation and follow along as meditation played.  Education:  Stress Management, Discharge Planning.   Education Outcome: Acknowledges edcuation/In group clarification offered/Needs additional education  Clinical Observations/Feedback: Pt did not attend group.    Caroll RancherMarjette Milady Fleener, LRT/CTRS         Caroll RancherLindsay, Lars Jeziorski A 07/11/2018 11:39 AM

## 2018-07-11 NOTE — Progress Notes (Signed)
Discharge note:  Patient discharged home per MD order.  She feels she is ready to go and denies any thoughts of self harm.  She rates her depression and anxiety as a 3; denies any hopelessness.  She is sleeping and eating well; her energy level is normal and her concentration is good.  Her goal is to "find healthy relationships."  Reviewed AVS/transition record with patient and she indicated understanding.  She received all personal belongings from unit and locker.  Patient left ambulatory with her cousin.  Patient received prescriptions and samples of her medications.

## 2019-09-02 ENCOUNTER — Encounter (HOSPITAL_COMMUNITY): Payer: Self-pay

## 2019-09-02 ENCOUNTER — Emergency Department (HOSPITAL_COMMUNITY)
Admission: EM | Admit: 2019-09-02 | Discharge: 2019-09-03 | Disposition: A | Discharge status: Home / Self Care | Payer: Self-pay | Attending: Emergency Medicine | Admitting: Emergency Medicine

## 2019-09-02 ENCOUNTER — Other Ambulatory Visit: Payer: Self-pay

## 2019-09-02 DIAGNOSIS — Z5321 Procedure and treatment not carried out due to patient leaving prior to being seen by health care provider: Secondary | ICD-10-CM | POA: Insufficient documentation

## 2019-09-02 DIAGNOSIS — R109 Unspecified abdominal pain: Secondary | ICD-10-CM | POA: Insufficient documentation

## 2019-09-02 LAB — CBC
HCT: 39.7 % (ref 36.0–46.0)
Hemoglobin: 13 g/dL (ref 12.0–15.0)
MCH: 30.2 pg (ref 26.0–34.0)
MCHC: 32.7 g/dL (ref 30.0–36.0)
MCV: 92.1 fL (ref 80.0–100.0)
Platelets: 284 10*3/uL (ref 150–400)
RBC: 4.31 MIL/uL (ref 3.87–5.11)
RDW: 12.9 % (ref 11.5–15.5)
WBC: 16.9 10*3/uL — ABNORMAL HIGH (ref 4.0–10.5)
nRBC: 0 % (ref 0.0–0.2)

## 2019-09-02 LAB — COMPREHENSIVE METABOLIC PANEL
ALT: 18 U/L (ref 0–44)
AST: 39 U/L (ref 15–41)
Albumin: 4.2 g/dL (ref 3.5–5.0)
Alkaline Phosphatase: 58 U/L (ref 38–126)
Anion gap: 13 (ref 5–15)
BUN: <5 mg/dL — ABNORMAL LOW (ref 6–20)
CO2: 27 mmol/L (ref 22–32)
Calcium: 8.9 mg/dL (ref 8.9–10.3)
Chloride: 99 mmol/L (ref 98–111)
Creatinine, Ser: 0.76 mg/dL (ref 0.44–1.00)
GFR calc Af Amer: >60 mL/min (ref >60)
GFR calc non Af Amer: >60 mL/min (ref >60)
Glucose, Bld: 105 mg/dL — ABNORMAL HIGH (ref 70–99)
Potassium: 2.9 mmol/L — ABNORMAL LOW (ref 3.5–5.1)
Sodium: 139 mmol/L (ref 135–145)
Total Bilirubin: 0.3 mg/dL (ref 0.3–1.2)
Total Protein: 6.9 g/dL (ref 6.5–8.1)

## 2019-09-02 LAB — I-STAT BETA HCG BLOOD, ED (MC, WL, AP ONLY): I-stat hCG, quantitative: <5 m[IU]/mL (ref <5)

## 2019-09-02 LAB — URINALYSIS, ROUTINE W REFLEX MICROSCOPIC
Bilirubin Urine: NEGATIVE
Glucose, UA: NEGATIVE mg/dL
Hgb urine dipstick: NEGATIVE
Ketones, ur: NEGATIVE mg/dL
Leukocytes,Ua: NEGATIVE
Nitrite: NEGATIVE
Protein, ur: NEGATIVE mg/dL
Specific Gravity, Urine: 1.005 (ref 1.005–1.030)
pH: 6 (ref 5.0–8.0)

## 2019-09-02 LAB — LIPASE, BLOOD: Lipase: 2060 U/L — ABNORMAL HIGH (ref 11–51)

## 2019-09-02 MED ORDER — SODIUM CHLORIDE 0.9% FLUSH: 3.0000 mL | Freq: Once | INTRAVENOUS | Status: DC

## 2019-09-02 NOTE — ED Triage Notes (Signed)
Onset 5pm today sudden pain RUQ, constant, vomited x 7.

## 2019-09-03 NOTE — ED Notes (Signed)
Pt has left facilty

## 2020-12-12 ENCOUNTER — Other Ambulatory Visit: Payer: Self-pay

## 2020-12-12 ENCOUNTER — Encounter (HOSPITAL_COMMUNITY): Payer: Self-pay

## 2020-12-12 DIAGNOSIS — J4540 Moderate persistent asthma, uncomplicated: Secondary | ICD-10-CM | POA: Insufficient documentation

## 2020-12-12 DIAGNOSIS — R109 Unspecified abdominal pain: Secondary | ICD-10-CM | POA: Insufficient documentation

## 2020-12-12 DIAGNOSIS — R110 Nausea: Secondary | ICD-10-CM | POA: Insufficient documentation

## 2020-12-12 DIAGNOSIS — F17210 Nicotine dependence, cigarettes, uncomplicated: Secondary | ICD-10-CM | POA: Insufficient documentation

## 2020-12-12 DIAGNOSIS — N926 Irregular menstruation, unspecified: Secondary | ICD-10-CM | POA: Insufficient documentation

## 2020-12-12 LAB — URINALYSIS, ROUTINE W REFLEX MICROSCOPIC
Bilirubin Urine: NEGATIVE
Glucose, UA: NEGATIVE mg/dL
Hgb urine dipstick: NEGATIVE
Ketones, ur: NEGATIVE mg/dL
Leukocytes,Ua: NEGATIVE
Nitrite: NEGATIVE
Protein, ur: NEGATIVE mg/dL
Specific Gravity, Urine: 1.002 — ABNORMAL LOW (ref 1.005–1.030)
pH: 7 (ref 5.0–8.0)

## 2020-12-12 LAB — I-STAT BETA HCG BLOOD, ED (MC, WL, AP ONLY): I-stat hCG, quantitative: <5 m[IU]/mL (ref <5)

## 2020-12-12 NOTE — ED Triage Notes (Signed)
Patient  C/o left flank pain since yesterday. Patient states she had a tubal ligation and her period is 9 days late. Patient states she has had 2 positive pregnancy tests and 2 negative tests.

## 2020-12-12 NOTE — ED Notes (Signed)
Lavender and light green top sent to lab.

## 2020-12-13 ENCOUNTER — Emergency Department (HOSPITAL_COMMUNITY): Payer: Self-pay

## 2020-12-13 ENCOUNTER — Encounter (HOSPITAL_COMMUNITY): Payer: Self-pay

## 2020-12-13 ENCOUNTER — Emergency Department (HOSPITAL_COMMUNITY)
Admission: EM | Admit: 2020-12-13 | Discharge: 2020-12-13 | Disposition: A | Discharge status: Home / Self Care | Payer: Self-pay | Attending: Emergency Medicine | Admitting: Emergency Medicine

## 2020-12-13 DIAGNOSIS — R109 Unspecified abdominal pain: Secondary | ICD-10-CM

## 2020-12-13 LAB — CBC WITH DIFFERENTIAL/PLATELET
Abs Immature Granulocytes: 0.03 10*3/uL (ref 0.00–0.07)
Basophils Absolute: 0.1 10*3/uL (ref 0.0–0.1)
Basophils Relative: 1 %
Eosinophils Absolute: 0.3 10*3/uL (ref 0.0–0.5)
Eosinophils Relative: 3 %
HCT: 37.3 % (ref 36.0–46.0)
Hemoglobin: 11.9 g/dL — ABNORMAL LOW (ref 12.0–15.0)
Immature Granulocytes: 0 %
Lymphocytes Relative: 21 %
Lymphs Abs: 2.1 10*3/uL (ref 0.7–4.0)
MCH: 30.2 pg (ref 26.0–34.0)
MCHC: 31.9 g/dL (ref 30.0–36.0)
MCV: 94.7 fL (ref 80.0–100.0)
Monocytes Absolute: 0.7 10*3/uL (ref 0.1–1.0)
Monocytes Relative: 7 %
Neutro Abs: 7.2 10*3/uL (ref 1.7–7.7)
Neutrophils Relative %: 68 %
Platelets: 248 10*3/uL (ref 150–400)
RBC: 3.94 MIL/uL (ref 3.87–5.11)
RDW: 13.4 % (ref 11.5–15.5)
WBC: 10.4 10*3/uL (ref 4.0–10.5)
nRBC: 0 % (ref 0.0–0.2)

## 2020-12-13 LAB — BASIC METABOLIC PANEL
Anion gap: 8 (ref 5–15)
BUN: 6 mg/dL (ref 6–20)
CO2: 26 mmol/L (ref 22–32)
Calcium: 9.1 mg/dL (ref 8.9–10.3)
Chloride: 103 mmol/L (ref 98–111)
Creatinine, Ser: 0.8 mg/dL (ref 0.44–1.00)
GFR, Estimated: >60 mL/min (ref >60)
Glucose, Bld: 87 mg/dL (ref 70–99)
Potassium: 3.9 mmol/L (ref 3.5–5.1)
Sodium: 137 mmol/L (ref 135–145)

## 2020-12-13 MED ORDER — OXYCODONE-ACETAMINOPHEN 5-325 MG PO TABS
1.0000 | ORAL_TABLET | Freq: Once | ORAL | Status: AC
  Administered 2020-12-13: 02:00:00 1 via ORAL
  Filled 2020-12-13: qty 1

## 2020-12-13 NOTE — ED Provider Notes (Signed)
[facility]-EMERGENCY DEPT Provider Note   CSN: [id number] Arrival date & time: 12/12/20  1642     History Chief Complaint  Patient presents with  . Flank Pain    CARRA BRINDLEY is a 30 y.o. female.  The history is provided by the patient and medical records.  Flank Pain    30 y.o. F with hx of anemia, asthma, depression, fibromyalgia, rectal bleeding, presenting to the ED with left flank pain.  Patient states she started having pain yesterday with associated nausea.  Pain mostly left sided and wraps around to left flank.  No vomiting.  No real urinary symptoms.  States her menstrual cycle is 9 days late-- took 4 home UPT (2+, 2-).  States she is s/p tubal ligation 5 years ago.  Past Medical History:  Diagnosis Date  . Anemia   . Anxiety   . Asthma   . Chest pain   . Depression   . Fibromyalgia   . Gestational diabetes 2010, 2012  . Heartburn during pregnancy   . Rectal bleeding   . Seizures (HCC) 2015   x 1- unknown reason- passed out  . Ulcer     Patient Active Problem List   Diagnosis Date Noted  . MDD (major depressive disorder), recurrent severe, without psychosis (HCC) 07/08/2018  . Suicide attempt (HCC) 07/08/2018  . Asthma, mild 07/07/2018  . Cannabis abuse 07/07/2018  . Ileus, postoperative (HCC) 09/10/2016  . Status post tubal ligation 09/10/2016  . Status post repeat low transverse cesarean section 09/05/2016  . Anemia of mother in pregnancy, antepartum 06/29/2016  . GBS bacteriuria 04/06/2016  . Asthma affecting pregnancy, antepartum 02/08/2016  . Short interval between pregnancies affecting pregnancy, antepartum 02/08/2016  . Previous cesarean section complicating pregnancy, antepartum condition or complication 12/14/2014  . H/O pre-eclampsia in prior pregnancy, currently pregnant 12/14/2014  . Asthma, moderate persistent, well-controlled 12/14/2014  . Tobacco smoking affecting pregnancy, antepartum 12/14/2014  . Bipolar  depression (HCC) 12/14/2014  . Supervision of normal pregnancy 11/16/2014  . History of diabetes as a child and gestational diabetes 11/16/2014  . Underweight 11/16/2014    Past Surgical History:  Procedure Laterality Date  . CESAREAN SECTION     x 2  . CESAREAN SECTION WITH BILATERAL TUBAL LIGATION Bilateral 07/03/2015   Procedure: REPEAT CESAREAN SECTION ;  Surgeon: Tereso Newcomer, MD;  Location: [facility];  Service: Obstetrics;  Laterality: Bilateral;  . CESAREAN SECTION WITH BILATERAL TUBAL LIGATION Bilateral 09/05/2016   Procedure: REPEAT CESAREAN SECTION WITH BILATERAL TUBAL LIGATION;  Surgeon: [place] Bing, MD;  Location: [facility];  Service: Obstetrics;  Laterality: Bilateral;  . COLONOSCOPY  04/16/2008  . WISDOM TOOTH EXTRACTION       OB History    Gravida  5   Para  4   Term  4   Preterm      AB  1   Living  3     SAB  1   IAB      Ectopic      Multiple  0   Live Births  3           Family History  Problem Relation Age of Onset  . Depression Mother   . Fibromyalgia Mother   . Cancer Mother        breast  . Diabetes Maternal Grandmother   . Hypertension Maternal Grandmother   . Arthritis Maternal Grandmother   . Thyroid disease Maternal Grandmother  Social History   Tobacco Use  . Smoking status: Current Every Day Smoker    Packs/day: 0.50    Types: Cigarettes  . Smokeless tobacco: Former Neurosurgeon    Quit date: 08/24/2014  Vaping Use  . Vaping Use: Never used  Substance Use Topics  . Alcohol use: No  . Drug use: Yes    Types: Marijuana    Home Medications Prior to Admission medications   Medication Sig Start Date End Date Taking? Authorizing Provider  FLUoxetine (PROZAC) 20 MG capsule Take 1 capsule (20 mg total) by mouth daily. 07/12/18   Starkes-Perry, Juel Burrow, FNP  hydrOXYzine (ATARAX/VISTARIL) 25 MG tablet Take 1 tablet (25 mg total) by mouth every 6 (six) hours as needed for anxiety. 07/11/18   Starkes-Perry, Juel Burrow, FNP   QUEtiapine (SEROQUEL) 25 MG tablet Take 1 tablet (25 mg total) by mouth every morning. 07/12/18   Starkes-Perry, Juel Burrow, FNP  QUEtiapine (SEROQUEL) 50 MG tablet Take 1 tablet (50 mg total) by mouth at bedtime. 07/11/18   Starkes-Perry, Juel Burrow, FNP  traZODone (DESYREL) 50 MG tablet Take 1 tablet (50 mg total) by mouth at bedtime as needed for sleep. 07/11/18   Maryagnes Amos, FNP    Allergies    Patient has no known allergies.  Review of Systems   Review of Systems  Genitourinary: Positive for flank pain.  All other systems reviewed and are negative.   Physical Exam Updated Vital Signs BP 112/65 (BP Location: Right Arm)   Pulse 60   Temp 99 F (37.2 C) (Oral)   Resp 17   Ht 5\' 8"  (1.727 m)   Wt 46.7 kg   LMP 11/03/2020 (Approximate) Comment: negative beta HCG 12/12/20  SpO2 100%   BMI 15.66 kg/m   Physical Exam Vitals and nursing note reviewed.  Constitutional:      Appearance: She is well-developed and well-nourished.     Comments: Overall comfortable appearing, NAD  HENT:     Head: Normocephalic and atraumatic.     Mouth/Throat:     Mouth: Oropharynx is clear and moist.  Eyes:     Extraocular Movements: EOM normal.     Conjunctiva/sclera: Conjunctivae normal.     Pupils: Pupils are equal, round, and reactive to light.  Cardiovascular:     Rate and Rhythm: Normal rate and regular rhythm.     Heart sounds: Normal heart sounds.  Pulmonary:     Effort: Pulmonary effort is normal.     Breath sounds: Normal breath sounds.  Abdominal:     General: Bowel sounds are normal.     Palpations: Abdomen is soft.       Comments: Mildly tender as depicted, no rash   Musculoskeletal:        General: Normal range of motion.     Cervical back: Normal range of motion.  Skin:    General: Skin is warm and dry.  Neurological:     Mental Status: She is alert and oriented to person, place, and time.  Psychiatric:        Mood and Affect: Mood and affect normal.     ED  Results / Procedures / Treatments   Labs (all labs ordered are listed, but only abnormal results are displayed) Labs Reviewed  URINALYSIS, ROUTINE W REFLEX MICROSCOPIC - Abnormal; Notable for the following components:      Result Value   Color, Urine COLORLESS (*)    Specific Gravity, Urine 1.002 (*)    All other  components within normal limits  CBC WITH DIFFERENTIAL/PLATELET - Abnormal; Notable for the following components:   Hemoglobin 11.9 (*)    All other components within normal limits  BASIC METABOLIC PANEL  I-STAT BETA HCG BLOOD, ED ([facility], [facility], [facility] ONLY)    EKG None  Radiology CT Renal Stone Study  Result Date: 12/13/2020 CLINICAL DATA:  Left flank pain EXAM: CT ABDOMEN AND PELVIS WITHOUT CONTRAST TECHNIQUE: Multidetector CT imaging of the abdomen and pelvis was performed following the standard protocol without IV contrast. COMPARISON:  09/09/2016 FINDINGS: Lower chest: No acute abnormality. Hepatobiliary: Multiple calcified gallstones are seen within a contracted gallbladder suggesting changes of chronic cholecystitis. No pericholecystic inflammatory changes are identified. Liver unremarkable. No intra or extrahepatic biliary ductal dilation. Pancreas: Unremarkable. No pancreatic ductal dilatation or surrounding inflammatory changes. Spleen: Normal in size without focal abnormality. Adrenals/Urinary Tract: Adrenal glands are unremarkable. Kidneys are normal, without renal calculi, focal lesion, or hydronephrosis. Bladder is unremarkable. Stomach/Bowel: Stomach is within normal limits. Appendix appears normal. No evidence of bowel wall thickening, distention, or inflammatory changes. No free intraperitoneal gas or fluid. Vascular/Lymphatic: No significant vascular findings are present. No enlarged abdominal or pelvic lymph nodes. Reproductive: Uterus and bilateral adnexa are unremarkable. Other: Rectum unremarkable.  No abdominal wall hernia. Musculoskeletal: No acute bone abnormality  IMPRESSION: No acute intra-abdominal pathology identified. No definite radiographic explanation for the patient's reported left flank pain. Multiple gallstones within a contracted gallbladder suggesting changes of chronic cholecystitis. Electronically Signed   By: Helyn Numbers MD   On: 12/13/2020 01:22    Procedures Procedures (including critical care time)  Medications Ordered in ED Medications  oxyCODONE-acetaminophen (PERCOCET/ROXICET) 5-325 MG per tablet 1 tablet (1 tablet Oral Given 12/13/20 0137)    ED Course  I have reviewed the triage vital signs and the nursing notes.  Pertinent labs & imaging results that were available during my care of the patient were reviewed by me and considered in my medical decision making (see chart for details).    MDM Rules/Calculators/A&P  30 year old female presenting to the ED with left flank pain that began yesterday.  Denies any urinary symptoms.  States her menstrual cycle is currently 9 days late which is abnormal.  She is status post tubal ligation reports 2+ home pregnancy test, and 2 negative.  She is afebrile and nontoxic in appearance here.  She overall appears comfortable.  Pregnancy test here is negative.  UA without noted hematuria or signs of infection.  Screening labs reassuring.  CT renal stone study without acute findings, does have incidental findings of gallstones.  She is without RUQ pain currently, no active emesis, tolerating PO.  Feel she is stable for discharge home.  Continue symptomatic care.  Will need FU as an OP if symptoms persist.  Return here for any new/acute changes.  Final Clinical Impression(s) / ED Diagnoses Final diagnoses:  Left flank pain    Rx / DC Orders ED Discharge Orders    None       Garlon Hatchet, PA-C 12/13/20 [id number]    Geoffery Lyons, MD 12/14/20 [phone]

## 2020-12-13 NOTE — ED Notes (Signed)
Discharged with boyfriend no concerns

## 2020-12-13 NOTE — Discharge Instructions (Signed)
Labs and scan today looked reassuring.  Pregnancy test was negative. Continue supportive care at home. Return here for any new/acute changes.

## 2021-04-19 ENCOUNTER — Other Ambulatory Visit: Payer: Self-pay

## 2021-04-19 ENCOUNTER — Ambulatory Visit
Admission: EM | Admit: 2021-04-19 | Discharge: 2021-04-19 | Disposition: A | Discharge status: Home / Self Care | Payer: Self-pay | Attending: Emergency Medicine | Admitting: Emergency Medicine

## 2021-04-19 ENCOUNTER — Encounter: Payer: Self-pay | Admitting: Emergency Medicine

## 2021-04-19 DIAGNOSIS — N760 Acute vaginitis: Secondary | ICD-10-CM | POA: Insufficient documentation

## 2021-04-19 LAB — POCT URINALYSIS DIP (MANUAL ENTRY)
Bilirubin, UA: NEGATIVE
Blood, UA: NEGATIVE
Glucose, UA: NEGATIVE mg/dL
Ketones, POC UA: NEGATIVE mg/dL
Leukocytes, UA: NEGATIVE
Nitrite, UA: NEGATIVE
Protein Ur, POC: NEGATIVE mg/dL
Spec Grav, UA: 1.015 (ref 1.010–1.025)
Urobilinogen, UA: 0.2 E.U./dL
pH, UA: 8.5 — AB (ref 5.0–8.0)

## 2021-04-19 LAB — POCT URINE PREGNANCY: Preg Test, Ur: NEGATIVE

## 2021-04-19 MED ORDER — FLUCONAZOLE 150 MG PO TABS: 150.0000 mg | ORAL_TABLET | Freq: Once | ORAL | 0 refills | Status: AC

## 2021-04-19 NOTE — ED Provider Notes (Signed)
[name]    CSN: [id number] Arrival date & time: 04/19/21  1201      History   Chief Complaint Chief Complaint  Patient presents with  . Vaginal Discharge  . Dysuria    HPI Kim [name] is a 31 y.o. female presenting today for evaluation of vaginal discharge and dysuria.  Reports symptoms for approximately 3 days.  Reports associated itching irritation and thick white discharge.  Last menstrual cycle approximately 2 weeks ago.  HPI  Past Medical History:  Diagnosis Date  . Anemia   . Anxiety   . Asthma   . Chest pain   . Depression   . Fibromyalgia   . Gestational diabetes 2010, 2012  . Heartburn during pregnancy   . Rectal bleeding   . Seizures (HCC) 2015   x 1- unknown reason- passed out  . Ulcer     Patient Active Problem List   Diagnosis Date Noted  . MDD (major depressive disorder), recurrent severe, without psychosis (HCC) 07/08/2018  . Suicide attempt (HCC) 07/08/2018  . Asthma, mild 07/07/2018  . Cannabis abuse 07/07/2018  . Ileus, postoperative (HCC) 09/10/2016  . Status post tubal ligation 09/10/2016  . Status post repeat low transverse cesarean section 09/05/2016  . Anemia of mother in pregnancy, antepartum 06/29/2016  . GBS bacteriuria 04/06/2016  . Asthma affecting pregnancy, antepartum 02/08/2016  . Short interval between pregnancies affecting pregnancy, antepartum 02/08/2016  . Previous cesarean section complicating pregnancy, antepartum condition or complication 12/14/2014  . H/O pre-eclampsia in prior pregnancy, currently pregnant 12/14/2014  . Asthma, moderate persistent, well-controlled 12/14/2014  . Tobacco smoking affecting pregnancy, antepartum 12/14/2014  . Bipolar depression (HCC) 12/14/2014  . Supervision of normal pregnancy 11/16/2014  . History of diabetes as a child and gestational diabetes 11/16/2014  . Underweight 11/16/2014    Past Surgical History:  Procedure Laterality Date  . CESAREAN SECTION     x 2   . CESAREAN SECTION WITH BILATERAL TUBAL LIGATION Bilateral 07/03/2015   Procedure: REPEAT CESAREAN SECTION ;  Surgeon: Tereso Newcomer, MD;  Location: [facility];  Service: Obstetrics;  Laterality: Bilateral;  . CESAREAN SECTION WITH BILATERAL TUBAL LIGATION Bilateral 09/05/2016   Procedure: REPEAT CESAREAN SECTION WITH BILATERAL TUBAL LIGATION;  Surgeon: [place] Bing, MD;  Location: [facility];  Service: Obstetrics;  Laterality: Bilateral;  . COLONOSCOPY  04/16/2008  . WISDOM TOOTH EXTRACTION      OB History    Gravida  5   Para  4   Term  4   Preterm      AB  1   Living  3     SAB  1   IAB      Ectopic      Multiple  0   Live Births  3            Home Medications    Prior to Admission medications   Medication Sig Start Date End Date Taking? Authorizing Provider  fluconazole (DIFLUCAN) 150 MG tablet Take 1 tablet (150 mg total) by mouth once for 1 dose. 04/19/21 04/19/21 Yes Jaidin Ugarte C, PA-C  FLUoxetine (PROZAC) 20 MG capsule Take 1 capsule (20 mg total) by mouth daily. 07/12/18   Starkes-Perry, Juel Burrow, FNP  hydrOXYzine (ATARAX/VISTARIL) 25 MG tablet Take 1 tablet (25 mg total) by mouth every 6 (six) hours as needed for anxiety. 07/11/18   Starkes-Perry, Juel Burrow, FNP  QUEtiapine (SEROQUEL) 25 MG tablet Take 1 tablet (25 mg total) by  mouth every morning. 07/12/18   Starkes-Perry, Juel Burrow, FNP  QUEtiapine (SEROQUEL) 50 MG tablet Take 1 tablet (50 mg total) by mouth at bedtime. 07/11/18   Starkes-Perry, Juel Burrow, FNP  traZODone (DESYREL) 50 MG tablet Take 1 tablet (50 mg total) by mouth at bedtime as needed for sleep. 07/11/18   Maryagnes Amos, FNP    Family History Family History  Problem Relation Age of Onset  . Depression Mother   . Fibromyalgia Mother   . Cancer Mother        breast  . Diabetes Maternal Grandmother   . Hypertension Maternal Grandmother   . Arthritis Maternal Grandmother   . Thyroid disease Maternal Grandmother     Social  History Social History   Tobacco Use  . Smoking status: Current Every Day Smoker    Packs/day: 0.50    Types: Cigarettes  . Smokeless tobacco: Former Neurosurgeon    Quit date: 08/24/2014  Vaping Use  . Vaping Use: Never used  Substance Use Topics  . Alcohol use: No  . Drug use: Yes    Types: Marijuana     Allergies   Patient has no known allergies.   Review of Systems Review of Systems  Constitutional: Negative for fever.  Respiratory: Negative for shortness of breath.   Cardiovascular: Negative for chest pain.  Gastrointestinal: Negative for abdominal pain, diarrhea, nausea and vomiting.  Genitourinary: Positive for dysuria and vaginal discharge. Negative for flank pain, genital sores, hematuria, menstrual problem, vaginal bleeding and vaginal pain.  Musculoskeletal: Negative for back pain.  Skin: Negative for rash.  Neurological: Negative for dizziness, light-headedness and headaches.     Physical Exam Triage Vital Signs ED Triage Vitals [04/19/21 1225]  Enc Vitals Group     BP 123/76     Pulse Rate 78     Resp 18     Temp 98 F (36.7 C)     Temp Source Oral     SpO2 99 %     Weight      Height      Head Circumference      Peak Flow      Pain Score 2     Pain Loc      Pain Edu?      Excl. in GC?    No data found.  Updated Vital Signs BP 123/76 (BP Location: Left Arm)   Pulse 78   Temp 98 F (36.7 C) (Oral)   Resp 18   SpO2 99%   Visual Acuity Right Eye Distance:   Left Eye Distance:   Bilateral Distance:    Right Eye Near:   Left Eye Near:    Bilateral Near:     Physical Exam Vitals and nursing note reviewed.  Constitutional:      Appearance: She is well-developed.     Comments: No acute distress  HENT:     Head: Normocephalic and atraumatic.     Nose: Nose normal.  Eyes:     Conjunctiva/sclera: Conjunctivae normal.  Cardiovascular:     Rate and Rhythm: Normal rate.  Pulmonary:     Effort: Pulmonary effort is normal. No respiratory  distress.  Abdominal:     General: There is no distension.  Musculoskeletal:        General: Normal range of motion.     Cervical back: Neck supple.  Skin:    General: Skin is warm and dry.  Neurological:     Mental Status: She is alert and  oriented to person, place, and time.      UC Treatments / Results  Labs (all labs ordered are listed, but only abnormal results are displayed) Labs Reviewed  POCT URINALYSIS DIP (MANUAL ENTRY) - Abnormal; Notable for the following components:      Result Value   pH, UA 8.5 (*)    All other components within normal limits  POCT URINE PREGNANCY  CERVICOVAGINAL ANCILLARY ONLY    EKG   Radiology No results found.  Procedures Procedures (including critical care time)  Medications Ordered in UC Medications - No data to display  Initial Impression / Assessment and Plan / UC Course  I have reviewed the triage vital signs and the nursing notes.  Pertinent labs & imaging results that were available during my care of the patient were reviewed by me and considered in my medical decision making (see chart for details).     UA not suggestive of UTI, suspect likely vaginitis-empirically treating for yeast, vaginal swab pending for further confirmation and STD screening.  Will alter therapy as needed based off results. Discussed strict return precautions. Patient verbalized understanding and is agreeable with plan.   Final Clinical Impressions(s) / UC Diagnoses   Final diagnoses:  Vaginitis and vulvovaginitis     Discharge Instructions     Urine normal Take 1 tab diflucan today, repeat in 72 hours if needed Swab pending Follow up if not improving or worsening    ED Prescriptions    Medication Sig Dispense Auth. Provider   fluconazole (DIFLUCAN) 150 MG tablet Take 1 tablet (150 mg total) by mouth once for 1 dose. 2 tablet Fayola Meckes, [place] C, PA-C     PDMP not reviewed this encounter.   Lew Dawes, PA-C 04/19/21  1329

## 2021-04-19 NOTE — ED Triage Notes (Signed)
Pt sts white vaginal discharge and dysuria x 3 days

## 2021-04-19 NOTE — Discharge Instructions (Signed)
Urine normal Take 1 tab diflucan today, repeat in 72 hours if needed Swab pending Follow up if not improving or worsening

## 2021-04-20 LAB — CERVICOVAGINAL ANCILLARY ONLY
Bacterial Vaginitis (gardnerella): NEGATIVE
Candida Glabrata: NEGATIVE
Candida Vaginitis: POSITIVE — AB
Chlamydia: NEGATIVE
Comment: NEGATIVE
Comment: NEGATIVE
Comment: NEGATIVE
Comment: NEGATIVE
Comment: NEGATIVE
Comment: NORMAL
Neisseria Gonorrhea: NEGATIVE
Trichomonas: NEGATIVE

## 2021-12-13 ENCOUNTER — Other Ambulatory Visit: Payer: Self-pay

## 2021-12-13 ENCOUNTER — Ambulatory Visit: Payer: Medicaid Other | Admitting: Family Medicine

## 2021-12-13 ENCOUNTER — Encounter: Payer: Self-pay | Admitting: Family Medicine

## 2021-12-13 VITALS — BP 109/69 | HR 79 | Temp 98.1°F | Resp 16 | Ht 68.0 in | Wt 108.2 lb

## 2021-12-13 DIAGNOSIS — F419 Anxiety disorder, unspecified: Secondary | ICD-10-CM | POA: Diagnosis not present

## 2021-12-13 DIAGNOSIS — Z23 Encounter for immunization: Secondary | ICD-10-CM | POA: Diagnosis not present

## 2021-12-13 DIAGNOSIS — Z7689 Persons encountering health services in other specified circumstances: Secondary | ICD-10-CM

## 2021-12-13 DIAGNOSIS — F32A Depression, unspecified: Secondary | ICD-10-CM

## 2021-12-13 MED ORDER — HYDROXYZINE HCL 25 MG PO TABS: 25.0000 mg | ORAL_TABLET | Freq: Four times a day (QID) | ORAL | 1 refills | Status: DC | PRN

## 2021-12-13 MED ORDER — FLUOXETINE HCL 10 MG PO CAPS
10.0000 mg | ORAL_CAPSULE | Freq: Every day | ORAL | 3 refills | Status: DC
Start: 2021-12-13 — End: 2022-11-07

## 2021-12-13 NOTE — Progress Notes (Signed)
Patient is here to est care with provider. Patient has stop taking all her medication because she did not have insurance. But she is no longer on anything.

## 2021-12-14 ENCOUNTER — Encounter: Payer: Self-pay | Admitting: Family Medicine

## 2021-12-14 NOTE — Progress Notes (Signed)
New Patient Office Visit  Subjective:  Patient ID: Kim [name], female    DOB: Jan 17, 1990  Age: 31 y.o. MRN: [id number]  CC:  Chief Complaint  Patient presents with   Establish Care    HPI Kim [name] presents for to establish care. Patient also reports that she has not been taking her meds for depression and anxiety specifically because she did not have insurance or $$.   Past Medical History:  Diagnosis Date   Anemia    Anxiety    Asthma    Chest pain    Depression    Fibromyalgia    Gestational diabetes 2010, 2012   Heartburn during pregnancy    Rectal bleeding    Seizures (HCC) 2015   x 1- unknown reason- passed out   Ulcer     Past Surgical History:  Procedure Laterality Date   CESAREAN SECTION     x 2   CESAREAN SECTION WITH BILATERAL TUBAL LIGATION Bilateral 07/03/2015   Procedure: REPEAT CESAREAN SECTION ;  Surgeon: Tereso Newcomer, MD;  Location: [facility];  Service: Obstetrics;  Laterality: Bilateral;   CESAREAN SECTION WITH BILATERAL TUBAL LIGATION Bilateral 09/05/2016   Procedure: REPEAT CESAREAN SECTION WITH BILATERAL TUBAL LIGATION;  Surgeon: [place] Bing, MD;  Location: [facility];  Service: Obstetrics;  Laterality: Bilateral;   COLONOSCOPY  04/16/2008   WISDOM TOOTH EXTRACTION      Family History  Problem Relation Age of Onset   Depression Mother    Fibromyalgia Mother    Cancer Mother        breast   Diabetes Maternal Grandmother    Hypertension Maternal Grandmother    Arthritis Maternal Grandmother    Thyroid disease Maternal Grandmother     Social History   Socioeconomic History   Marital status: Single    Spouse name: Not on file   Number of children: Not on file   Years of education: Not on file   Highest education level: Not on file  Occupational History   Not on file  Tobacco Use   Smoking status: Every Day    Packs/day: 0.50    Types: Cigarettes   Smokeless tobacco: Former    Quit date: 08/24/2014  Vaping Use    Vaping Use: Never used  Substance and Sexual Activity   Alcohol use: No   Drug use: Yes    Types: Marijuana   Sexual activity: Yes    Birth control/protection: None  Other Topics Concern   Not on file  Social History Narrative   Not on file   Social Determinants of Health   Financial Resource Strain: Not on file  Food Insecurity: Not on file  Transportation Needs: Not on file  Physical Activity: Not on file  Stress: Not on file  Social Connections: Not on file  Intimate Partner Violence: Not on file    ROS Review of Systems  Neurological:  Negative for seizures.  Psychiatric/Behavioral:  Positive for sleep disturbance. Negative for self-injury and suicidal ideas. The patient is nervous/anxious.   All other systems reviewed and are negative.  Objective:   Today's Vitals: BP 109/69    Pulse 79    Temp 98.1 F (36.7 C) (Temporal)    Resp 16    Ht 5\' 8"  (1.727 m)    Wt 108 lb 3.2 oz (49.1 kg)    BMI 16.45 kg/m   Physical Exam Vitals and nursing note reviewed.  Constitutional:      General:  She is not in acute distress. Cardiovascular:     Rate and Rhythm: Normal rate and regular rhythm.  Pulmonary:     Effort: Pulmonary effort is normal.     Breath sounds: Normal breath sounds.  Neurological:     General: No focal deficit present.     Mental Status: She is alert and oriented to person, place, and time.  Psychiatric:        Attention and Perception: Attention normal.        Mood and Affect: Affect normal. Mood is anxious.        Behavior: Behavior normal. Behavior is cooperative.    Assessment & Plan:   1. Anxiety and depression Prozac 10 mg daily and atarax prescribed. monitor  2. Need for influenza vaccination  - Flu Vaccine QUAD 2mo+IM (Fluarix, Fluzone & Alfiuria Quad PF)  3. Encounter to establish care     Outpatient Encounter Medications as of 12/13/2021  Medication Sig   FLUoxetine (PROZAC) 10 MG capsule Take 1 capsule (10 mg total) by mouth  daily.   hydrOXYzine (ATARAX) 25 MG tablet Take 1 tablet (25 mg total) by mouth every 6 (six) hours as needed for anxiety.   [DISCONTINUED] FLUoxetine (PROZAC) 20 MG capsule Take 1 capsule (20 mg total) by mouth daily. (Patient not taking: Reported on 12/13/2021)   [DISCONTINUED] hydrOXYzine (ATARAX/VISTARIL) 25 MG tablet Take 1 tablet (25 mg total) by mouth every 6 (six) hours as needed for anxiety. (Patient not taking: Reported on 12/13/2021)   [DISCONTINUED] QUEtiapine (SEROQUEL) 25 MG tablet Take 1 tablet (25 mg total) by mouth every morning. (Patient not taking: Reported on 12/13/2021)   [DISCONTINUED] QUEtiapine (SEROQUEL) 50 MG tablet Take 1 tablet (50 mg total) by mouth at bedtime. (Patient not taking: Reported on 12/13/2021)   [DISCONTINUED] traZODone (DESYREL) 50 MG tablet Take 1 tablet (50 mg total) by mouth at bedtime as needed for sleep. (Patient not taking: Reported on 12/13/2021)   No facility-administered encounter medications on file as of 12/13/2021.    Follow-up: Return in about 4 weeks (around 01/10/2022) for follow up.   Tommie Raymond, MD

## 2022-01-08 ENCOUNTER — Ambulatory Visit: Payer: Medicaid Other | Admitting: Family Medicine

## 2022-02-08 ENCOUNTER — Encounter: Payer: Medicaid Other | Admitting: Advanced Practice Midwife

## 2022-04-24 ENCOUNTER — Other Ambulatory Visit (HOSPITAL_COMMUNITY)
Admission: RE | Admit: 2022-04-24 | Discharge: 2022-04-24 | Disposition: A | Discharge status: Home / Self Care | Payer: Medicaid Other | Source: Ambulatory Visit | Attending: Physician Assistant | Admitting: Physician Assistant

## 2022-04-24 ENCOUNTER — Encounter: Payer: Self-pay | Admitting: Physician Assistant

## 2022-04-24 ENCOUNTER — Ambulatory Visit (INDEPENDENT_AMBULATORY_CARE_PROVIDER_SITE_OTHER): Payer: Medicaid Other | Admitting: Physician Assistant

## 2022-04-24 VITALS — BP 135/89 | HR 78 | Temp 98.5°F | Resp 18 | Ht 68.0 in | Wt 106.0 lb

## 2022-04-24 DIAGNOSIS — F32A Depression, unspecified: Secondary | ICD-10-CM | POA: Insufficient documentation

## 2022-04-24 DIAGNOSIS — N941 Unspecified dyspareunia: Secondary | ICD-10-CM | POA: Diagnosis not present

## 2022-04-24 DIAGNOSIS — N3001 Acute cystitis with hematuria: Secondary | ICD-10-CM | POA: Diagnosis not present

## 2022-04-24 DIAGNOSIS — F5104 Psychophysiologic insomnia: Secondary | ICD-10-CM

## 2022-04-24 DIAGNOSIS — N939 Abnormal uterine and vaginal bleeding, unspecified: Secondary | ICD-10-CM

## 2022-04-24 DIAGNOSIS — F430 Acute stress reaction: Secondary | ICD-10-CM

## 2022-04-24 DIAGNOSIS — F419 Anxiety disorder, unspecified: Secondary | ICD-10-CM

## 2022-04-24 HISTORY — DX: Abnormal uterine and vaginal bleeding, unspecified: N93.9

## 2022-04-24 LAB — POCT URINALYSIS DIP (CLINITEK)
Bilirubin, UA: NEGATIVE
Glucose, UA: NEGATIVE mg/dL
Ketones, POC UA: NEGATIVE mg/dL
Nitrite, UA: POSITIVE — AB
POC PROTEIN,UA: NEGATIVE
Spec Grav, UA: 1.015 (ref 1.010–1.025)
Urobilinogen, UA: 0.2 E.U./dL
pH, UA: 7 (ref 5.0–8.0)

## 2022-04-24 MED ORDER — NITROFURANTOIN MONOHYD MACRO 100 MG PO CAPS: 100.0000 mg | ORAL_CAPSULE | Freq: Two times a day (BID) | ORAL | 0 refills | Status: AC

## 2022-04-24 NOTE — Progress Notes (Signed)
Patient has taken medication today. ?Patient reports pain with intercourse 2 days ago. Patient reports the same partner for the past few years. Patient noticed pain on the left side of her abdomen being present for the past week. ?Patient shares the last 3 cycles have last a week and a half with her Hx being 3-4 day cycles. ?
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
## 2022-04-24 NOTE — Patient Instructions (Signed)
You are going to take Macrobid twice a day for 5 days for your urinary tract infection. ? ?I encourage you to drink lots of water, and get plenty of rest. ? ?To help with your increased anxiety and difficulty sleeping, I do encourage you to do a trial of 50 mg of hydroxyzine 30 to 45 minutes before bedtime. ? ?We will call you with today's lab results, you will follow-up with the mobile team in 1 week. ? ?Roney Jaffe, PA-C ?Physician Assistant ?[facility] ?[email or web address] ? ? ?Urinary Tract Infection, Adult ? ?A urinary tract infection (UTI) is an infection of any part of the urinary tract. The urinary tract includes the kidneys, ureters, bladder, and urethra. These organs make, store, and get rid of urine in the body. ?An upper UTI affects the ureters and kidneys. A lower UTI affects the bladder and urethra. ?What are the causes? ?Most urinary tract infections are caused by bacteria in your genital area around your urethra, where urine leaves your body. These bacteria grow and cause inflammation of your urinary tract. ?What increases the risk? ?You are more likely to develop this condition if: ?You have a urinary catheter that stays in place. ?You are not able to control when you urinate or have a bowel movement (incontinence). ?You are female and you: ?Use a spermicide or diaphragm for birth control. ?Have low estrogen levels. ?Are pregnant. ?You have certain genes that increase your risk. ?You are sexually active. ?You take antibiotic medicines. ?You have a condition that causes your flow of urine to slow down, such as: ?An enlarged prostate, if you are female. ?Blockage in your urethra. ?A kidney stone. ?A nerve condition that affects your bladder control (neurogenic bladder). ?Not getting enough to drink, or not urinating often. ?You have certain medical conditions, such as: ?Diabetes. ?A weak disease-fighting system (immunesystem). ?Sickle cell  disease. ?Gout. ?Spinal cord injury. ?What are the signs or symptoms? ?Symptoms of this condition include: ?Needing to urinate right away (urgency). ?Frequent urination. This may include small amounts of urine each time you urinate. ?Pain or burning with urination. ?Blood in the urine. ?Urine that smells bad or unusual. ?Trouble urinating. ?Cloudy urine. ?Vaginal discharge, if you are female. ?Pain in the abdomen or the lower back. ?You may also have: ?Vomiting or a decreased appetite. ?Confusion. ?Irritability or tiredness. ?A fever or chills. ?Diarrhea. ?The first symptom in older adults may be confusion. In some cases, they may not have any symptoms until the infection has worsened. ?How is this diagnosed? ?This condition is diagnosed based on your medical history and a physical exam. You may also have other tests, including: ?Urine tests. ?Blood tests. ?Tests for STIs (sexually transmitted infections). ?If you have had more than one UTI, a cystoscopy or imaging studies may be done to determine the cause of the infections. ?How is this treated? ?Treatment for this condition includes: ?Antibiotic medicine. ?Over-the-counter medicines to treat discomfort. ?Drinking enough water to stay hydrated. ?If you have frequent infections or have other conditions such as a kidney stone, you may need to see a health care provider who specializes in the urinary tract (urologist). ?In rare cases, urinary tract infections can cause sepsis. Sepsis is a life-threatening condition that occurs when the body responds to an infection. Sepsis is treated in the hospital with IV antibiotics, fluids, and other medicines. ?Follow these instructions at home: ? ?Medicines ?Take over-the-counter and prescription medicines only as told by your health care provider. ?If you were prescribed an  antibiotic medicine, take it as told by your health care provider. Do not stop using the antibiotic even if you start to feel better. ?General  instructions ?Make sure you: ?Empty your bladder often and completely. Do not hold urine for long periods of time. ?Empty your bladder after sex. ?Wipe from front to back after urinating or having a bowel movement if you are female. Use each tissue only one time when you wipe. ?Drink enough fluid to keep your urine pale yellow. ?Keep all follow-up visits. This is important. ?Contact a health care provider if: ?Your symptoms do not get better after 1-2 days. ?Your symptoms go away and then return. ?Get help right away if: ?You have severe pain in your back or your lower abdomen. ?You have a fever or chills. ?You have nausea or vomiting. ?Summary ?A urinary tract infection (UTI) is an infection of any part of the urinary tract, which includes the kidneys, ureters, bladder, and urethra. ?Most urinary tract infections are caused by bacteria in your genital area. ?Treatment for this condition often includes antibiotic medicines. ?If you were prescribed an antibiotic medicine, take it as told by your health care provider. Do not stop using the antibiotic even if you start to feel better. ?Keep all follow-up visits. This is important. ?This information is not intended to replace advice given to you by your health care provider. Make sure you discuss any questions you have with your health care provider. ?Document Revised: 07/29/2020 Document Reviewed: 07/29/2020 ?Elsevier Patient Education ? 2023 [employer] ? ?Insomnia ?Insomnia is a sleep disorder that makes it difficult to fall asleep or stay asleep. Insomnia can cause fatigue, low energy, difficulty concentrating, mood swings, and poor performance at work or school. ?There are three different ways to classify insomnia: ?Difficulty falling asleep. ?Difficulty staying asleep. ?Waking up too early in the morning. ?Any type of insomnia can be long-term (chronic) or short-term (acute). Both are common. Short-term insomnia usually lasts for 3 months or less. Chronic  insomnia occurs at least three times a week for longer than 3 months. ?What are the causes? ?Insomnia may be caused by another condition, situation, or substance, such as: ?Having certain mental health conditions, such as anxiety and depression. ?Using caffeine, alcohol, tobacco, or drugs. ?Having gastrointestinal conditions, such as gastroesophageal reflux disease (GERD). ?Having certain medical conditions. These include: ?Asthma. ?Alzheimer's disease. ?Stroke. ?Chronic pain. ?An overactive thyroid gland (hyperthyroidism). ?Other sleep disorders, such as restless legs syndrome and sleep apnea. ?Menopause. ?Sometimes, the cause of insomnia may not be known. ?What increases the risk? ?Risk factors for insomnia include: ?Gender. Females are affected more often than males. ?Age. Insomnia is more common as people get older. ?Stress and certain medical and mental health conditions. ?Lack of exercise. ?Having an irregular work schedule. This may include working night shifts and traveling between different time zones. ?What are the signs or symptoms? ?If you have insomnia, the main symptom is having trouble falling asleep or having trouble staying asleep. This may lead to other symptoms, such as: ?Feeling tired or having low energy. ?Feeling nervous about going to sleep. ?Not feeling rested in the morning. ?Having trouble concentrating. ?Feeling irritable, anxious, or depressed. ?How is this diagnosed? ?This condition may be diagnosed based on: ?Your symptoms and medical history. Your health care provider may ask about: ?Your sleep habits. ?Any medical conditions you have. ?Your mental health. ?A physical exam. ?How is this treated? ?Treatment for insomnia depends on the cause. Treatment may focus on treating an underlying  condition that is causing the insomnia. Treatment may also include: ?Medicines to help you sleep. ?Counseling or therapy. ?Lifestyle adjustments to help you sleep better. ?Follow these instructions at  home: ?Eating and drinking ? ?Limit or avoid alcohol, caffeinated beverages, and products that contain nicotine and tobacco, especially close to bedtime. These can disrupt your sleep. ?Do not eat a large meal or eat spicy foo

## 2022-04-25 LAB — CERVICOVAGINAL ANCILLARY ONLY
Bacterial Vaginitis (gardnerella): NEGATIVE
Candida Glabrata: NEGATIVE
Candida Vaginitis: NEGATIVE
Chlamydia: NEGATIVE
Comment: NEGATIVE
Comment: NEGATIVE
Comment: NEGATIVE
Comment: NEGATIVE
Comment: NEGATIVE
Comment: NORMAL
Neisseria Gonorrhea: NEGATIVE
Trichomonas: NEGATIVE

## 2022-04-25 LAB — CBC WITH DIFFERENTIAL/PLATELET
Basophils Absolute: 0.1 10*3/uL (ref 0.0–0.2)
Basos: 1 %
EOS (ABSOLUTE): 0.2 10*3/uL (ref 0.0–0.4)
Eos: 3 %
Hematocrit: 34.4 % (ref 34.0–46.6)
Hemoglobin: 11.5 g/dL (ref 11.1–15.9)
Immature Grans (Abs): 0 10*3/uL (ref 0.0–0.1)
Immature Granulocytes: 0 %
Lymphocytes Absolute: 2 10*3/uL (ref 0.7–3.1)
Lymphs: 33 %
MCH: 28.5 pg (ref 26.6–33.0)
MCHC: 33.4 g/dL (ref 31.5–35.7)
MCV: 85 fL (ref 79–97)
Monocytes Absolute: 0.4 10*3/uL (ref 0.1–0.9)
Monocytes: 6 %
Neutrophils Absolute: 3.5 10*3/uL (ref 1.4–7.0)
Neutrophils: 57 %
Platelets: 242 10*3/uL (ref 150–450)
RBC: 4.04 x10E6/uL (ref 3.77–5.28)
RDW: 12.6 % (ref 11.7–15.4)
WBC: 6.1 10*3/uL (ref 3.4–10.8)

## 2022-04-25 LAB — COMP. METABOLIC PANEL (12)
AST: 17 IU/L (ref 0–40)
Albumin/Globulin Ratio: 2 (ref 1.2–2.2)
Albumin: 4.5 g/dL (ref 3.8–4.8)
Alkaline Phosphatase: 70 IU/L (ref 44–121)
BUN/Creatinine Ratio: 10 (ref 9–23)
BUN: 7 mg/dL (ref 6–20)
Bilirubin Total: 0.2 mg/dL (ref 0.0–1.2)
Calcium: 9.2 mg/dL (ref 8.7–10.2)
Chloride: 105 mmol/L (ref 96–106)
Creatinine, Ser: 0.7 mg/dL (ref 0.57–1.00)
Globulin, Total: 2.2 g/dL (ref 1.5–4.5)
Glucose: 89 mg/dL (ref 70–99)
Potassium: 4.2 mmol/L (ref 3.5–5.2)
Sodium: 139 mmol/L (ref 134–144)
Total Protein: 6.7 g/dL (ref 6.0–8.5)
eGFR: 117 mL/min/{1.73_m2} (ref >59)

## 2022-04-25 LAB — TSH: TSH: 1.29 u[IU]/mL (ref 0.450–4.500)

## 2022-04-27 ENCOUNTER — Telehealth: Payer: Self-pay | Admitting: *Deleted

## 2022-04-27 LAB — URINE CULTURE

## 2022-04-27 NOTE — Telephone Encounter (Signed)
Patient is aware of results and already being treated for  ECOLI UTI with macrobid. ?Patient may view results via mychart and contact the office for any questions at [phone]. ? ?

## 2022-04-27 NOTE — Telephone Encounter (Signed)
-----   Message from Roney Jaffe, PA-C sent at 04/26/2022  8:27 AM EDT ----- ?Will you plase check on her urine culture?  I would have expected it by now  ?----- Message ----- ?From: Margaretmary Lombard, CMA ?Sent: 04/24/2022   9:59 AM EDT ?To: Cari S Mayers, PA-C ? ? ?

## 2022-05-01 ENCOUNTER — Ambulatory Visit (INDEPENDENT_AMBULATORY_CARE_PROVIDER_SITE_OTHER): Payer: Medicaid Other | Admitting: Physician Assistant

## 2022-05-01 ENCOUNTER — Encounter: Payer: Self-pay | Admitting: Physician Assistant

## 2022-05-01 VITALS — BP 108/72 | HR 99 | Temp 98.7°F | Resp 18 | Ht 68.0 in | Wt 107.0 lb

## 2022-05-01 DIAGNOSIS — N941 Unspecified dyspareunia: Secondary | ICD-10-CM
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
## 2022-05-01 NOTE — Patient Instructions (Signed)
Please let us know if there is anything else we can do for you.  I have enclosed some information for you regarding your previous concern ? ?Roney Jaffe, PA-C ?Physician Assistant ?[facility] ?[email or web address] ? ? ?Dyspareunia, Female ?Dyspareunia is pain that is associated with sexual activity. This can affect any part of the genitals or lower abdomen. There are many possible causes of this condition. In some cases, diagnosing the cause of dyspareunia can be difficult. ?This condition can be mild, moderate, or severe. Depending on the cause, dyspareunia may get better with treatment, but it may return (recur) over time. ?What are the causes? ? ?The cause of this condition is not always known. However, problems that affect the outer female genital area (vulva), the vagina, the uterus, and other organs may cause dyspareunia. ?Common causes of this condition include: ?Vaginal dryness. ?Giving birth. ?Infection. ?Skin changes or conditions. ?Side effects of medicines. ?A condition of severe pain and tenderness of the vulva when it is touched (vulvodynia). ?Endometriosis. This is when tissue that is like the lining of the uterus grows on the outside of the uterus. ?Psychological conditions. These include depression, anxiety, or traumatic experiences. ?Allergic reaction. ?What increases the risk? ?The following factors may make you more likely to develop this condition: ?History of physical or sexual trauma. ?Some medicines. ?No longer having a monthly period (menopause). ?Having recently given birth. ?Taking baths using soaps that have perfumes. These can cause irritation. ?Douching. ?What are the signs or symptoms? ?The main symptom of this condition is pain in any part of your genitals or lower abdomen during or after sex. This may include: ?Pain and tenderness of the vulva when it is touched. ?Irritation, burning, or stinging sensations in your  vulva. ?Aching and throbbing pain that may be constant. ?Pain that gets worse when something is inserted into your vagina. ?How is this diagnosed? ?This condition may be diagnosed based on: ?Your symptoms, including where and when your pain occurs. ?Your medical history. ?A physical exam. A pelvic exam will most likely be done. ?Tests, including blood tests and tests that check the body for infection. ?Imaging tests, such as an ultrasound. ?You may be referred to a health care provider who specializes in women's health (gynecologist). ?How is this treated? ?Treatment for this condition depends on the cause of the condition and your symptoms. Treatment may include: ?Lubricants, ointments, and creams. ?Physical therapy. ?Massage therapy. ?Hormonal therapy. ?Medicines to: ?Prevent or fight infection. ?Relieve pain. ?Help numb the area. ?Treat depression (antidepressants). ?Counseling, which may include sex therapy. ?Surgery. ?In most cases, you may need to stop sexual activity until your symptoms go away or get better. ?Follow these instructions at home: ?Lifestyle ?Wear cotton underwear. ?Use water-based lubricants as needed during sex. Avoid oil-based lubricants. ?Do not use any products that can cause irritation. This may include certain condoms, spermicides, lubricants, soaps, tampons, vaginal sprays, or douches. ?Always practice safe sex. Talk to your health care provider about how to prevent sexually transmitted infections with this condition. ?Talk freely with your partner about your condition. ?General instructions ?Take over-the-counter and prescription medicines only as told by your health care provider. ?Urinate before you have sex. ?Consider joining a support group. ?It is up to you to get the results of any tests you have done. Ask your health care provider, or the department that is doing the tests, when your results will be ready. ?Keep all follow-up visits. This is important. ?Contact a health care  provider if: ?  You have vaginal bleeding after having sex. ?You develop a lump at the opening of your vagina even if the lump is painless. ?You have symptoms that get worse or do not improve with treatment. ?You have: ?Abnormal discharge from your vagina. ?Vaginal dryness. ?Itchiness or irritation of your vulva or vagina. ?You develop a new rash. ?You have a fever. ?You have pain when you urinate or blood in your urine. ?Summary ?Dyspareunia is pain that is associated with sexual activity. This can affect any part of the genitals or lower abdomen. ?There are many causes of this condition. Treatment depends on the cause and your symptoms. In most cases, you may need to stop sexual activity until your symptoms improve. ?Take over-the-counter and prescription medicines only as told by your health care provider. ?Contact a health care provider if your symptoms get worse or do not improve with treatment. ?Keep all follow-up visits. This is important. ?This information is not intended to replace advice given to you by your health care provider. Make sure you discuss any questions you have with your health care provider. ?Document Revised: 04/25/2021 Document Reviewed: 04/25/2021 ?Elsevier Patient Education ? 2023 [employer]vier Inc[employer] ? ?

## 2022-09-24 ENCOUNTER — Ambulatory Visit: Payer: Self-pay | Admitting: *Deleted

## 2022-09-24 NOTE — Telephone Encounter (Signed)
Message from [employer] sent at 09/24/2022 12:44 PM EDT  Summary: stomach discomfort / diarrhea   The patient has experienced stomach discomfort for roughly two weeks   The patient has had stomach pain and very watery diarrhea for two weeks   The patient has not experienced any nausea or vomiting   Please contact further when possible           Call History   Type Contact Phone/Fax User  09/24/2022 12:43 PM EDT Phone (Incoming) Victorio, Makaylynn R (Self) [phone] Rexene Edison) Coley, Everette A   Reason for Disposition  [1] SEVERE diarrhea (e.g., 7 or more times / day more than normal) AND [2] present > 24 hours (1 day)  Answer Assessment - Initial Assessment Questions 1. DIARRHEA SEVERITY: "How bad is the diarrhea?" "How many more stools have you had in the past 24 hours than normal?"    - NO DIARRHEA (SCALE 0)   - MILD (SCALE 1-3): Few loose or mushy BMs; increase of 1-3 stools over normal daily number of stools; mild increase in ostomy output.   -  MODERATE (SCALE 4-7): Increase of 4-6 stools daily over normal; moderate increase in ostomy output.   -  SEVERE (SCALE 8-10; OR "WORST POSSIBLE"): Increase of 7 or more stools daily over normal; moderate increase in ostomy output; incontinence.     Diarrhea for 2 weeks.   It very liquid.    I've had colon ulcers in the past.   2. ONSET: "When did the diarrhea begin?"      2 weeks ago 3. BM CONSISTENCY: "How loose or watery is the diarrhea?"      Very watery    I can almost see through it.  4. VOMITING: "Are you also vomiting?" If Yes, ask: "How many times in the past 24 hours?"      Vomited twice. 5. ABDOMEN PAIN: "Are you having any abdomen pain?" If Yes, ask: "What does it feel like?" (e.g., crampy, dull, intermittent, constant)      Yes  6. ABDOMEN PAIN SEVERITY: If present, ask: "How bad is the pain?"  (e.g., Scale 1-10; mild, moderate, or severe)   - MILD (1-3): doesn't interfere with normal activities, abdomen soft and not  tender to touch    - MODERATE (4-7): interferes with normal activities or awakens from sleep, abdomen tender to touch    - SEVERE (8-10): excruciating pain, doubled over, unable to do any normal activities       Sensitive to the touch today.     7. ORAL INTAKE: If vomiting, "Have you been able to drink liquids?" "How much liquids have you had in the past 24 hours?"     I'm drinking normally.   My appetite is decreased 8. HYDRATION: "Any signs of dehydration?" (e.g., dry mouth [not just dry lips], too weak to stand, dizziness, new weight loss) "When did you last urinate?"     I'm drinking fluids. If stand up too fast I get dizzy.    I'm sleeping too much  9. EXPOSURE: "Have you traveled to a foreign country recently?" "Have you been exposed to anyone with diarrhea?" "Could you have eaten any food that was spoiled?"     No 10. ANTIBIOTIC USE: "Are you taking antibiotics now or have you taken antibiotics in the past 2 months?"       No 11. OTHER SYMPTOMS: "Do you have any other symptoms?" (e.g., fever, blood in stool)       I've  noticed blood on the toilet paper.   Small amount. 12. PREGNANCY: "Is there any chance you are pregnant?" "When was your last menstrual period?"       No  Protocols used: [facility]

## 2022-09-24 NOTE — Telephone Encounter (Signed)
  Chief Complaint: Watery diarrhea and abd pain for 2 weeks.   Has a history of colon ulcers years ago had same symptoms Symptoms: Very watery diarrhea that none of the OTC medications have helped.    Frequency: Daily for 2 weeks. Pertinent Negatives: Patient denies antibiotics during the last 2 months or travel history Disposition: [] ED /[] Urgent Care (no appt availability in office) / [] Appointment(In office/virtual)/ []  [facility]/ [] Home Care/ [] Refused Recommended Disposition /[x] [facility]/ []  Follow-up with PCP Additional Notes: She needs to get a babysitter so can't be seen today but is going to the [facility] tomorrow in [place], [place].   Location and hours given.   Instructed to go on to the ED if she gets worse.

## 2022-09-25 ENCOUNTER — Encounter: Payer: Self-pay | Admitting: Physician Assistant

## 2022-09-25 ENCOUNTER — Ambulatory Visit
Admission: EM | Admit: 2022-09-25 | Discharge: 2022-09-25 | Disposition: A | Discharge status: Other Acute Inpatient Hospital | Payer: Medicaid Other

## 2022-09-25 DIAGNOSIS — R1030 Lower abdominal pain, unspecified: Secondary | ICD-10-CM

## 2022-09-25 DIAGNOSIS — R197 Diarrhea, unspecified: Secondary | ICD-10-CM | POA: Diagnosis not present

## 2022-09-25 NOTE — ED Triage Notes (Signed)
Pt c/o abdominal pain with loose stools that contain mucus that are clear, nausea, vomiting, and bloating. Says it gets worst when she eats.  Also took Pepto but says it did not help.

## 2022-09-25 NOTE — ED Provider Notes (Signed)
[name]    CSN: [id number] Arrival date & time: 09/25/22  7672      History   Chief Complaint Chief Complaint  Patient presents with   Abdominal Pain    HPI Kim [name] is a 32 y.o. female.   Patient here today for evaluation of abdominal pain with loose stools that started 2 weeks ago.  She reports that she is having about 4 bowel movements each day and that stools were initially dark and tarry and now they are more mucousy and clear.  She has had some nausea but no significant vomiting.  She does feel bloated at times.  She has not had fever.  She has tried taking Pepto-Bismol without significant relief.  She does have history of "colon ulcers".  The history is provided by the patient.    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   Suicide attempt (HCC) 07/08/2018   Asthma, mild 07/07/2018   Cannabis abuse 07/07/2018   Ileus, postoperative (HCC) 09/10/2016   Status post tubal ligation 09/10/2016   Status post repeat low transverse cesarean section 09/05/2016   Anemia of mother in pregnancy, antepartum 06/29/2016   GBS bacteriuria 04/06/2016   Asthma affecting pregnancy, antepartum 02/08/2016   Short interval between pregnancies affecting pregnancy, antepartum 02/08/2016   Previous cesarean section complicating pregnancy, antepartum condition or complication 12/14/2014   H/O pre-eclampsia in prior pregnancy, currently pregnant  12/14/2014   Asthma, moderate persistent, well-controlled 12/14/2014   Tobacco smoking affecting pregnancy, antepartum 12/14/2014   Bipolar depression (HCC) 12/14/2014   Supervision of normal pregnancy 11/16/2014   History of diabetes as a child and gestational diabetes 11/16/2014   Underweight 11/16/2014    Past Surgical History:  Procedure Laterality Date   CESAREAN SECTION     x 2   CESAREAN SECTION WITH BILATERAL TUBAL LIGATION Bilateral 07/03/2015   Procedure: REPEAT CESAREAN SECTION ;  Surgeon: Tereso Newcomer, MD;  Location: [facility];  Service: Obstetrics;  Laterality: Bilateral;   CESAREAN SECTION WITH BILATERAL TUBAL LIGATION Bilateral 09/05/2016   Procedure: REPEAT CESAREAN SECTION WITH BILATERAL TUBAL LIGATION;  Surgeon: Stamps Bing, MD;  Location: [facility];  Service: Obstetrics;  Laterality: Bilateral;   COLONOSCOPY  04/16/2008   WISDOM TOOTH EXTRACTION      OB History     Gravida  5   Para  4   Term  4   Preterm      AB  1   Living  3      SAB  1   IAB      Ectopic      Multiple  0   Live Births  3            Home Medications    Prior to Admission medications   Medication Sig Start Date End Date Taking? Authorizing Provider  FLUoxetine (PROZAC) 10 MG  capsule Take 1 capsule (10 mg total) by mouth daily. Patient not taking: Reported on 05/01/2022 12/13/21   Dorna Mai, MD  hydrOXYzine (ATARAX) 25 MG tablet Take 1 tablet (25 mg total) by mouth every 6 (six) hours as needed for anxiety. Patient not taking: Reported on 05/01/2022 12/13/21   Dorna Mai, MD    Family History Family History  Problem Relation Age of Onset   Depression Mother    Fibromyalgia Mother    Cancer Mother        breast   Diabetes Maternal Grandmother    Hypertension Maternal Grandmother    Arthritis Maternal Grandmother    Thyroid disease Maternal Grandmother     Social History Social History   Tobacco Use   Smoking status: Every Day     Packs/day: 0.50    Types: Cigarettes   Smokeless tobacco: Former    Quit date: 08/24/2014  Vaping Use   Vaping Use: Never used  Substance Use Topics   Alcohol use: No   Drug use: Yes    Types: Marijuana     Allergies   Patient has no known allergies.   Review of Systems Review of Systems  Constitutional:  Negative for chills and fever.  Eyes:  Negative for discharge and redness.  Gastrointestinal:  Positive for abdominal pain, diarrhea and nausea. Negative for vomiting.     Physical Exam Triage Vital Signs ED Triage Vitals  Enc Vitals Group     BP      Pulse      Resp      Temp      Temp src      SpO2      Weight      Height      Head Circumference      Peak Flow      Pain Score      Pain Loc      Pain Edu?      Excl. in [place]?    No data found.  Updated Vital Signs BP 95/63 (BP Location: Left Arm)   Pulse 76   Temp 98.4 F (36.9 C) (Oral)   LMP  (LMP Unknown)   SpO2 98%      Physical Exam Vitals and nursing note reviewed.  Constitutional:      General: She is not in acute distress.    Appearance: Normal appearance. She is not ill-appearing.  HENT:     Head: Normocephalic and atraumatic.  Eyes:     Conjunctiva/sclera: Conjunctivae normal.  Cardiovascular:     Rate and Rhythm: Normal rate.  Pulmonary:     Effort: Pulmonary effort is normal.  Neurological:     Mental Status: She is alert.  Psychiatric:        Mood and Affect: Mood normal.        Behavior: Behavior normal.        Thought Content: Thought content normal.      UC Treatments / Results  Labs (all labs ordered are listed, but only abnormal results are displayed) Labs Reviewed - No data to display  EKG   Radiology No results found.  Procedures Procedures (including critical care time)  Medications Ordered in UC Medications - No data to display  Initial Impression / Assessment and Plan / UC Course  I have reviewed the triage vital signs and the nursing  notes.  Pertinent labs & imaging results that were available during my care of the patient were reviewed by me and considered  in my medical decision making (see chart for details).   Given duration of symptoms and significant pain reported recommended further evaluation in the emergency room for labs and further work-up.  Patient is agreeable to same.  Final Clinical Impressions(s) / UC Diagnoses   Final diagnoses:  Diarrhea, unspecified type  Lower abdominal pain     Discharge Instructions       [facility] [place]  [address] [place], [place] [address]     ED Prescriptions   None    PDMP not reviewed this encounter.   Tomi Bamberger, PA-C 09/25/22 1429

## 2022-09-25 NOTE — ED Notes (Signed)
Patient is being discharged from the Urgent Care and sent to the Emergency Department via pov . Per myers pa , patient is in need of higher level of care due to SYMPTOMS. Patient is aware and verbalizes understanding of plan of care.  Vitals:   09/25/22 0958  BP: 95/63  Pulse: 76  Temp: 98.4 F (36.9 C)  SpO2: 98%

## 2022-09-25 NOTE — Discharge Instructions (Signed)
  [facility] [place]  [address] [place], [place] [address] 

## 2022-10-04 ENCOUNTER — Emergency Department (HOSPITAL_COMMUNITY)
Admission: EM | Admit: 2022-10-04 | Discharge: 2022-10-05 | Discharge status: Against Medical Advice | Payer: Medicaid Other | Attending: Emergency Medicine | Admitting: Emergency Medicine

## 2022-10-04 ENCOUNTER — Ambulatory Visit: Payer: Self-pay

## 2022-10-04 ENCOUNTER — Encounter (HOSPITAL_COMMUNITY): Payer: Self-pay

## 2022-10-04 ENCOUNTER — Other Ambulatory Visit: Payer: Self-pay

## 2022-10-04 ENCOUNTER — Emergency Department (HOSPITAL_COMMUNITY): Payer: Medicaid Other

## 2022-10-04 DIAGNOSIS — R197 Diarrhea, unspecified: Secondary | ICD-10-CM | POA: Diagnosis not present

## 2022-10-04 DIAGNOSIS — Z5321 Procedure and treatment not carried out due to patient leaving prior to being seen by health care provider: Secondary | ICD-10-CM | POA: Insufficient documentation

## 2022-10-04 DIAGNOSIS — R1030 Lower abdominal pain, unspecified: Secondary | ICD-10-CM | POA: Diagnosis present

## 2022-10-04 LAB — CBC WITH DIFFERENTIAL/PLATELET
Abs Immature Granulocytes: 0 10*3/uL (ref 0.00–0.07)
Basophils Absolute: 0 10*3/uL (ref 0.0–0.1)
Basophils Relative: 0 %
Eosinophils Absolute: 0 10*3/uL (ref 0.0–0.5)
Eosinophils Relative: 0 %
HCT: 34.5 % — ABNORMAL LOW (ref 36.0–46.0)
Hemoglobin: 11.7 g/dL — ABNORMAL LOW (ref 12.0–15.0)
Lymphocytes Relative: 40 %
Lymphs Abs: 1.6 10*3/uL (ref 0.7–4.0)
MCH: 29 pg (ref 26.0–34.0)
MCHC: 33.9 g/dL (ref 30.0–36.0)
MCV: 85.4 fL (ref 80.0–100.0)
Monocytes Absolute: 0.4 10*3/uL (ref 0.1–1.0)
Monocytes Relative: 11 %
Neutro Abs: 2 10*3/uL (ref 1.7–7.7)
Neutrophils Relative %: 49 %
Platelets: 224 10*3/uL (ref 150–400)
RBC: 4.04 MIL/uL (ref 3.87–5.11)
RDW: 14.6 % (ref 11.5–15.5)
WBC: 4 10*3/uL (ref 4.0–10.5)
nRBC: 0 % (ref 0.0–0.2)
nRBC: 0 /100 WBC

## 2022-10-04 LAB — COMPREHENSIVE METABOLIC PANEL
ALT: 14 U/L (ref 0–44)
AST: 24 U/L (ref 15–41)
Albumin: 3.9 g/dL (ref 3.5–5.0)
Alkaline Phosphatase: 55 U/L (ref 38–126)
Anion gap: 11 (ref 5–15)
BUN: 11 mg/dL (ref 6–20)
CO2: 27 mmol/L (ref 22–32)
Calcium: 9.1 mg/dL (ref 8.9–10.3)
Chloride: 98 mmol/L (ref 98–111)
Creatinine, Ser: 0.79 mg/dL (ref 0.44–1.00)
GFR, Estimated: >60 mL/min (ref >60)
Glucose, Bld: 99 mg/dL (ref 70–99)
Potassium: 3 mmol/L — ABNORMAL LOW (ref 3.5–5.1)
Sodium: 136 mmol/L (ref 135–145)
Total Bilirubin: 0.4 mg/dL (ref 0.3–1.2)
Total Protein: 6.7 g/dL (ref 6.5–8.1)

## 2022-10-04 LAB — LIPASE, BLOOD: Lipase: 30 U/L (ref 11–51)

## 2022-10-04 LAB — I-STAT BETA HCG BLOOD, ED (MC, WL, AP ONLY): I-stat hCG, quantitative: <5 m[IU]/mL (ref <5)

## 2022-10-04 MED ORDER — IOHEXOL 350 MG/ML SOLN
75.0000 mL | Freq: Once | INTRAVENOUS | Status: AC | PRN
  Administered 2022-10-04: 75 mL via INTRAVENOUS

## 2022-10-04 NOTE — ED Triage Notes (Signed)
Reports abd pain n/v/d but diarrhea is clear and mucousy for months.  UC sent her here.  Reports had a head pain and after started vomiting.

## 2022-10-04 NOTE — Telephone Encounter (Signed)
  Chief Complaint: Vomiting Symptoms: Cannot keep anything down Frequency: 1 week Pertinent Negatives: Patient denies  Disposition: [x] ED /[] Urgent Care (no appt availability in office) / [] Appointment(In office/virtual)/ []  [facility]/ [] Home Care/ [] Refused Recommended Disposition /[] [facility]/ []  Follow-up with PCP Additional Notes: Pt was having diarrhea which has resolved. Pt now has vomiting for 1 week where she has not kept anything down. PT was sent ot UC, they sent her to ED. PT was unable to stay for treatment. Now pt is unable to keep food or liquids down. PT thinks she is dehydrated.  Summary: Nausea, no appt soon enough   Pt has nausea, says it has been over a week. She was sent to urgent care and they sent her to ED and she could not go because she has two children.   Best contact: [phone]      Reason for Disposition  [1] SEVERE vomiting (e.g., 6 or more times/day) AND [2] present > 8 hours (Exception: Patient sounds well, is drinking liquids, does not sound dehydrated, and vomiting has lasted less than 24 hours.)  Answer Assessment - Initial Assessment Questions 1. VOMITING SEVERITY: "How many times have you vomited in the past 24 hours?"     - MILD:  1 - 2 times/day    - MODERATE: 3 - 5 times/day, decreased oral intake without significant weight loss or symptoms of dehydration    - SEVERE: 6 or more times/day, vomits everything or nearly everything, with significant weight loss, symptoms of dehydration      moderate 2. ONSET: "When did the vomiting begin?"      1 week 3. FLUIDS: "What fluids or food have you vomited up today?" "Have you been able to keep any fluids down?"     Everything 4. ABDOMEN PAIN: "Are your having any abdomen pain?" If Yes : "How bad is it and what does it feel like?" (e.g., crampy, dull, intermittent, constant)      no 5. DIARRHEA: "Is there any diarrhea?" If Yes, ask: "How many times today?"      No has  resolved 6. CONTACTS: "Is there anyone else in the family with the same symptoms?"      no 7. CAUSE: "What do you think is causing your vomiting?"     Unsure 8. HYDRATION STATUS: "Any signs of dehydration?" (e.g., dry mouth [not only dry lips], too weak to stand) "When did you last urinate?"     Pt feels she is dehydrated 9. OTHER SYMPTOMS: "Do you have any other symptoms?" (e.g., fever, headache, vertigo, vomiting blood or coffee grounds, recent head injury)     Had diarrhea - which has resolved 10. PREGNANCY: "Is there any chance you are pregnant?" "When was your last menstrual period?"  Protocols used: Vomiting-A-AH

## 2022-10-04 NOTE — ED Provider Triage Note (Signed)
Emergency Medicine Provider Triage Evaluation Note  Kim [name] , a 32 y.o. female  was evaluated in triage.  Pt complains of lower abdominal pain for the last 3 weeks.  Patient reports that initially her diarrhea was clear, mucous the in appearance.  The patient reports that over the last 3 weeks her diarrhea has become darker, she is unsure if there is blood in it.  The patient states that her abdominal pain is primarily located on the left side.  Patient states that she does have remote history of ulcers in her colon however denies any diagnosis of ulcerative colitis.  Patient states that she has been attempting to control her symptoms utilizing change in diet however it has not treated her symptoms.  The patient denies any fevers.  Review of Systems  Positive:  Negative:   Physical Exam  BP 135/77   Pulse (!) 56   Temp 98 F (36.7 C)   Resp 18   Ht 5\' 8"  (1.727 m)   Wt 48.5 kg   LMP  (LMP Unknown)   SpO2 100%   BMI 16.27 kg/m  Gen:   Awake, no distress   Resp:  Normal effort  MSK:   Moves extremities without difficulty  Other:  Left lower quadrant tenderness on examination  Medical Decision Making  Medically screening exam initiated at 4:06 PM.  Appropriate orders placed.  HAIDEE STOGSDILL was informed that the remainder of the evaluation will be completed by another provider, this initial triage assessment does not replace that evaluation, and the importance of remaining in the ED until their evaluation is complete.     Azucena Cecil, PA-C 10/04/22 1607

## 2022-10-05 NOTE — ED Notes (Signed)
Called for room no answer

## 2022-10-07 NOTE — Progress Notes (Unsigned)
Patient ID: Kim [name], female    DOB: 07/25/1990  MRN: [id number]  CC: No chief complaint on file.   Subjective: Kim [name] is a 32 y.o. female who presents for  Her concerns today include:   09/25/2022 [facility] per PA note:  Given duration of symptoms and significant pain reported recommended further evaluation in the emergency room for labs and further work-up.  Patient is agreeable to same. Discharge Instructions         [facility]   [address] [place], [place] [address]  10/04/2022 per triage RN note: Chief Complaint: Vomiting Symptoms: Cannot keep anything down Frequency: 1 week Pertinent Negatives: Patient denies  Disposition: [x] ED /[] Urgent Care (no appt availability in office) / [] Appointment(In office/virtual)/ []  [facility]/ [] Home Care/ [] Refused Recommended Disposition /[] [facility]/ []  Follow-up with PCP Additional Notes: Pt was having diarrhea which has resolved. Pt now has vomiting for 1 week where she has not kept anything down. PT was sent ot UC, they sent her to ED. PT was unable to stay for treatment. Now pt is unable to keep food or liquids down. PT thinks she is dehydrated.      Summary: Nausea, no appt soon enough    Pt has nausea, says it has been over a week. She was sent to urgent care and they sent her to ED and she could not go because she has two children.   Best contact: [phone]       Reason for Disposition  [1] SEVERE vomiting (e.g., 6 or more times/day) AND [2] present > 8 hours (Exception: Patient sounds well, is drinking liquids, does not sound dehydrated, and vomiting has lasted less than 24 hours.)  Answer Assessment - Initial Assessment Questions 1. VOMITING SEVERITY: "How many times have you vomited in the past 24 hours?"     - MILD:  1 - 2 times/day    - MODERATE: 3 - 5 times/day, decreased oral intake without significant weight loss or symptoms of  dehydration    - SEVERE: 6 or more times/day, vomits everything or nearly everything, with significant weight loss, symptoms of dehydration      moderate 2. ONSET: "When did the vomiting begin?"      1 week 3. FLUIDS: "What fluids or food have you vomited up today?" "Have you been able to keep any fluids down?"     Everything 4. ABDOMEN PAIN: "Are your having any abdomen pain?" If Yes : "How bad is it and what does it feel like?" (e.g., crampy, dull, intermittent, constant)      no 5. DIARRHEA: "Is there any diarrhea?" If Yes, ask: "How many times today?"      No has resolved 6. CONTACTS: "Is there anyone else in the family with the same symptoms?"      no 7. CAUSE: "What do you think is causing your vomiting?"     Unsure 8. HYDRATION STATUS: "Any signs of dehydration?" (e.g., dry mouth [not only dry lips], too weak to stand) "When did you last urinate?"     Pt feels she is dehydrated 9. OTHER SYMPTOMS: "Do you have any other symptoms?" (e.g., fever, headache, vertigo, vomiting blood or coffee grounds, recent head injury)     Had diarrhea - which has resolved 10. PREGNANCY: "Is there any chance you are pregnant?" "When was your last menstrual period?"  Protocols used: Vomiting-A-AH   10/04/2022 [facility] Emergency  Department: [place]   Today's visit 10/10/2022:     Patient Active Problem List   Diagnosis Date Noted   Dyspareunia in female 04/24/2022   Abnormal uterine bleeding 04/24/2022   Anxiety and depression 04/24/2022   Psychophysiological insomnia 04/24/2022   MDD (major depressive disorder), recurrent severe, without psychosis ([place]) 07/08/2018   Suicide attempt ([place]) 07/08/2018   Asthma, mild 07/07/2018   Cannabis abuse 07/07/2018   Ileus, postoperative ([place]) 09/10/2016   Status post tubal ligation 09/10/2016   Status post repeat low transverse cesarean section 09/05/2016   Anemia of mother in pregnancy, antepartum 06/29/2016   GBS bacteriuria 04/06/2016    Asthma affecting pregnancy, antepartum 02/08/2016   Short interval between pregnancies affecting pregnancy, antepartum 02/08/2016   Previous cesarean section complicating pregnancy, antepartum condition or complication 97/98/9211   H/O pre-eclampsia in prior pregnancy, currently pregnant 12/14/2014   Asthma, moderate persistent, well-controlled 12/14/2014   Tobacco smoking affecting pregnancy, antepartum 12/14/2014   Bipolar depression ([place]) 12/14/2014   Supervision of normal pregnancy 11/16/2014   History of diabetes as a child and gestational diabetes 11/16/2014   Underweight 11/16/2014     Current Outpatient Medications on File Prior to Visit  Medication Sig Dispense Refill   FLUoxetine (PROZAC) 10 MG capsule Take 1 capsule (10 mg total) by mouth daily. (Patient not taking: Reported on 05/01/2022) 30 capsule 3   hydrOXYzine (ATARAX) 25 MG tablet Take 1 tablet (25 mg total) by mouth every 6 (six) hours as needed for anxiety. (Patient not taking: Reported on 05/01/2022) 30 tablet 1   No current facility-administered medications on file prior to visit.    No Known Allergies  Social History   Socioeconomic History   Marital status: Single    Spouse name: Not on file   Number of children: Not on file   Years of education: Not on file   Highest education level: Not on file  Occupational History   Not on file  Tobacco Use   Smoking status: Every Day    Packs/day: 0.50    Types: Cigarettes   Smokeless tobacco: Former    Quit date: 08/24/2014  Vaping Use   Vaping Use: Never used  Substance and Sexual Activity   Alcohol use: No   Drug use: Yes    Types: Marijuana   Sexual activity: Yes    Birth control/protection: None  Other Topics Concern   Not on file  Social History Narrative   Not on file   Social Determinants of Health   Financial Resource Strain: Not on file  Food Insecurity: Not on file  Transportation Needs: Not on file  Physical Activity: Not on file  Stress:  Not on file  Social Connections: Not on file  Intimate Partner Violence: Not on file    Family History  Problem Relation Age of Onset   Depression Mother    Fibromyalgia Mother    Cancer Mother        breast   Diabetes Maternal Grandmother    Hypertension Maternal Grandmother    Arthritis Maternal Grandmother    Thyroid disease Maternal Grandmother     Past Surgical History:  Procedure Laterality Date   CESAREAN SECTION     x 2   CESAREAN SECTION WITH BILATERAL TUBAL LIGATION Bilateral 07/03/2015   Procedure: REPEAT CESAREAN SECTION ;  Surgeon: Osborne Oman, MD;  Location: [facility];  Service: Obstetrics;  Laterality: Bilateral;   CESAREAN SECTION WITH BILATERAL TUBAL LIGATION Bilateral 09/05/2016   Procedure: REPEAT CESAREAN  SECTION WITH BILATERAL TUBAL LIGATION;  Surgeon: Aletha Halim, MD;  Location: [facility];  Service: Obstetrics;  Laterality: Bilateral;   COLONOSCOPY  04/16/2008   WISDOM TOOTH EXTRACTION      ROS: Review of Systems Negative except as stated above  PHYSICAL EXAM: LMP  (LMP Unknown)   Physical Exam  {female adult master:310786} {female adult master:310785}     Latest Ref Rng & Units 10/04/2022    4:26 PM 04/24/2022   10:09 AM 12/12/2020    5:36 PM  CMP  Glucose 70 - 99 mg/dL 99  89  87   BUN 6 - 20 mg/dL 11  7  6    Creatinine 0.44 - 1.00 mg/dL 0.79  0.70  0.80   Sodium 135 - 145 mmol/L 136  139  137   Potassium 3.5 - 5.1 mmol/L 3.0  4.2  3.9   Chloride 98 - 111 mmol/L 98  105  103   CO2 22 - 32 mmol/L 27   26   Calcium 8.9 - 10.3 mg/dL 9.1  9.2  9.1   Total Protein 6.5 - 8.1 g/dL 6.7  6.7    Total Bilirubin 0.3 - 1.2 mg/dL 0.4  0.2    Alkaline Phos 38 - 126 U/L 55  70    AST 15 - 41 U/L 24  17    ALT 0 - 44 U/L 14      Lipid Panel     Component Value Date/Time   CHOL 100 07/09/2018 0627   TRIG 60 07/09/2018 0627   HDL 47 07/09/2018 0627   CHOLHDL 2.1 07/09/2018 0627   VLDL 12 07/09/2018 0627   LDLCALC 41 07/09/2018 0627     CBC    Component Value Date/Time   WBC 4.0 10/04/2022 1626   RBC 4.04 10/04/2022 1626   HGB 11.7 (L) 10/04/2022 1626   HGB 11.5 04/24/2022 1009   HCT 34.5 (L) 10/04/2022 1626   HCT 34.4 04/24/2022 1009   PLT 224 10/04/2022 1626   PLT 242 04/24/2022 1009   MCV 85.4 10/04/2022 1626   MCV 85 04/24/2022 1009   MCV 92 03/09/2014 1541   MCH 29.0 10/04/2022 1626   MCHC 33.9 10/04/2022 1626   RDW 14.6 10/04/2022 1626   RDW 12.6 04/24/2022 1009   RDW 13.1 03/09/2014 1541   LYMPHSABS 1.6 10/04/2022 1626   LYMPHSABS 2.0 04/24/2022 1009   MONOABS 0.4 10/04/2022 1626   EOSABS 0.0 10/04/2022 1626   EOSABS 0.2 04/24/2022 1009   BASOSABS 0.0 10/04/2022 1626   BASOSABS 0.1 04/24/2022 1009    ASSESSMENT AND PLAN:  There are no diagnoses linked to this encounter.   Patient was given the opportunity to ask questions.  Patient verbalized understanding of the plan and was able to repeat key elements of the plan. Patient was given clear instructions to go to Emergency Department or return to medical center if symptoms don't improve, worsen, or new problems develop.The patient verbalized understanding.   No orders of the defined types were placed in this encounter.    Requested Prescriptions    No prescriptions requested or ordered in this encounter    No follow-ups on file.  Camillia Herter, NP

## 2022-10-10 ENCOUNTER — Ambulatory Visit (INDEPENDENT_AMBULATORY_CARE_PROVIDER_SITE_OTHER): Payer: Medicaid Other | Admitting: Family

## 2022-10-10 ENCOUNTER — Encounter: Payer: Self-pay | Admitting: Internal Medicine

## 2022-10-10 ENCOUNTER — Encounter: Payer: Self-pay | Admitting: Family

## 2022-10-10 ENCOUNTER — Telehealth: Payer: Self-pay | Admitting: Gastroenterology

## 2022-10-10 VITALS — BP 107/74 | HR 78 | Temp 98.3°F | Resp 16 | Ht 67.99 in | Wt 105.6 lb

## 2022-10-10 DIAGNOSIS — K8020 Calculus of gallbladder without cholecystitis without obstruction: Secondary | ICD-10-CM | POA: Diagnosis not present

## 2022-10-10 DIAGNOSIS — H9202 Otalgia, left ear: Secondary | ICD-10-CM | POA: Diagnosis not present

## 2022-10-10 DIAGNOSIS — R110 Nausea: Secondary | ICD-10-CM

## 2022-10-10 DIAGNOSIS — K8080 Other cholelithiasis without obstruction: Secondary | ICD-10-CM

## 2022-10-10 DIAGNOSIS — R195 Other fecal abnormalities: Secondary | ICD-10-CM | POA: Diagnosis not present

## 2022-10-10 MED ORDER — ONDANSETRON 4 MG PO TBDP: 4.0000 mg | ORAL_TABLET | Freq: Three times a day (TID) | ORAL | 1 refills | Status: DC | PRN

## 2022-10-10 NOTE — Telephone Encounter (Signed)
Urgent referral received from PCP for gallstones, mucus in stool and nausea.  Record in Epic.  Dr. Silverio Decamp supervisiing MD 10/10/22.  Please advise scheduling.

## 2022-10-10 NOTE — Telephone Encounter (Signed)
Given I dont have any available appointment within next couple of months. patient will need to be schedule with next available provider MD or APP. Thanks

## 2022-10-10 NOTE — Patient Instructions (Signed)
Cholelithiasis  Cholelithiasis happens when gallstones form in the gallbladder. The gallbladder stores bile. Bile is a fluid that helps digest fats. Bile can harden and form into gallstones. If they cause a blockage, they can cause pain (gallbladder attack). What are the causes? This condition may be caused by: Some blood diseases, such as sickle cell anemia. Too much of a fat-like substance (cholesterol) in your bile. Not enough bile salts in your bile. These salts help the body absorb and digest fats. The gallbladder not emptying fully or often enough. This is common in pregnant women. What increases the risk? The following factors may make you more likely to develop this condition: Being female. Being pregnant many times. Eating a lot of fried foods, fat, and refined carbs (refined carbohydrates). Being very overweight (obese). Being older than age 40. Using medicines with female hormones in them for a long time. Losing weight fast. Having gallstones in your family. Having some health problems, such as diabetes, Crohn's disease, or liver disease. What are the signs or symptoms? Often, there may be gallstones but no symptoms. These gallstones are called silent gallstones. If a gallstone causes a blockage, you may get sudden pain. The pain: Can be in the upper right part of your belly (abdomen). Normally comes at night or after you eat. Can last an hour or more. Can spread to your right shoulder, back, or chest. Can feel like discomfort, burning, or fullness in the upper part of your belly (indigestion). If the blockage lasts more than a few hours, you can get an infection or swelling. You may: Feel like you may vomit. Vomit. Feel bloated. Have belly pain for 5 hours or more. Feel tender in your belly, often in the upper right part and under your ribs. Have fever or chills. Have skin or the white parts of your eyes turn yellow (jaundice). Have dark pee (urine) or pale poop  (stool). How is this treated? Treatment for this condition depends on how bad you feel. If you have symptoms, you may need: Home care, if symptoms are not very bad. Do not eat for 12-24 hours. Drink only water and clear liquids. Start to eat simple or clear foods after 1 or 2 days. Try broths and crackers. You may need medicines for pain or stomach upset or both. If you have an infection, you will need antibiotics. A hospital stay, if you have very bad pain or a very bad infection. Surgery to remove your gallbladder. You may need this if: Gallstones keep coming back. You have very bad symptoms. Medicines to break up gallstones. Medicines: Are best for small gallstones. May be used for up to 6-12 months. A procedure to find and take out gallstones or to break up gallstones. Follow these instructions at home: Medicines Take over-the-counter and prescription medicines only as told by your doctor. If you were prescribed an antibiotic medicine, take it as told by your doctor. Do not stop taking the antibiotic even if you start to feel better. Ask your doctor if the medicine prescribed to you requires you to avoid driving or using machinery. Eating and drinking Drink enough fluid to keep your urine pale yellow. Drink water or clear fluids. This is important when you have pain. Eat healthy foods. Choose: Fewer fatty foods, such as fried foods. Fewer refined carbs. Avoid breads and grains that are highly processed, such as white bread and white rice. Choose whole grains, such as whole-wheat bread and brown rice. More fiber. Almonds, fresh fruit, and   beans are healthy sources. General instructions Keep a healthy weight. Keep all follow-up visits as told by your doctor. This is important. Where to find more information National Institute of Diabetes and Digestive and Kidney Diseases: www.niddk.nih.gov Contact a doctor if: You have sudden pain in the upper right part of your belly. Pain might  spread to your right shoulder, back, or chest. You have been diagnosed with gallstones that have no symptoms and you get: Belly pain. Discomfort, burning, or fullness in the upper part of your abdomen. You have dark urine or pale stools. Get help right away if: You have sudden pain in the upper right part of your abdomen, and the pain lasts more than 2 hours. You have pain in your abdomen, and: It lasts more than 5 hours. It keeps getting worse. You have a fever or chills. You keep feeling like you may vomit. You keep vomiting. Your skin or the white parts of your eyes turn yellow. Summary Cholelithiasis happens when gallstones form in the gallbladder. This condition may be caused by a blood disease, too much of a fat-like substance in the bile, or not enough bile salts in bile. Treatment for this condition depends on how bad you feel. If you have symptoms, do not eat or drink. You may need medicines. You may need a hospital stay for very bad pain or a very bad infection. You may need surgery if gallstones keep coming back or if you have very bad symptoms. This information is not intended to replace advice given to you by your health care provider. Make sure you discuss any questions you have with your health care provider. Document Revised: 02/05/2020 Document Reviewed: 11/09/2019 Elsevier Patient Education  2023 Elsevier Inc.  

## 2022-10-10 NOTE — Progress Notes (Signed)
Pt presents for nausea, vomiting, and diarrhea -emesis 2 days ago -diarrhea has stopped but bowel movements per pt contains large amounts of mucous -pain has subsided since symptoms first started

## 2022-10-15 ENCOUNTER — Encounter: Payer: Self-pay | Admitting: *Deleted

## 2022-10-15 ENCOUNTER — Ambulatory Visit: Payer: Self-pay | Admitting: *Deleted

## 2022-10-15 NOTE — Telephone Encounter (Signed)
  Chief Complaint: pain under L shoulder blade Symptoms: 9/10 Frequency: started yesterday Pertinent Negatives: Patient denies fever Disposition: [] ED /[] Urgent Care (no appt availability in office) / [] Appointment(In office/virtual)/ []  [facility]/ [] Home Care/ [] Refused Recommended Disposition /[] [facility]/ [x]  Follow-up with PCP Additional Notes: Pt has 10 days to wait for appt with gastro, she is asking for pain med for severe pain under left shoulder blade and under rib that started yesterday. The nausea med is working great.  Reason for Disposition  Shoulder pain  Answer Assessment - Initial Assessment Questions 1. ONSET: "When did the pain start?"     Yesterday started hurting under left shoulder blade 2. LOCATION: "Where is the pain located?"     Under left shoulder blade 3. PAIN: "How bad is the pain?" (Scale 1-10; or mild, moderate, severe)   - MILD (1-3): doesn't interfere with normal activities   - MODERATE (4-7): interferes with normal activities (e.g., work or school) or awakens from sleep   - SEVERE (8-10): excruciating pain, unable to do any normal activities, unable to move arm at all due to pain     8 4. WORK OR EXERCISE: "Has there been any recent work or exercise that involved this part of the body?"     no 5. CAUSE: "What do you think is causing the shoulder pain?"     Gall bladder 6. OTHER SYMPTOMS: "Do you have any other symptoms?" (e.g., neck pain, swelling, rash, fever, numbness, weakness)     No fever 7. PREGNANCY: "Is there any chance you are pregnant?" "When was your last menstrual period?"     no  Protocols used: Shoulder Pain-A-AH

## 2022-10-15 NOTE — Telephone Encounter (Signed)
Patient request until see GI doctor

## 2022-10-23 ENCOUNTER — Ambulatory Visit: Payer: Medicaid Other | Admitting: Family Medicine

## 2022-10-25 ENCOUNTER — Ambulatory Visit (INDEPENDENT_AMBULATORY_CARE_PROVIDER_SITE_OTHER): Payer: Medicaid Other | Admitting: Internal Medicine

## 2022-10-25 ENCOUNTER — Other Ambulatory Visit: Payer: Medicaid Other

## 2022-10-25 ENCOUNTER — Encounter: Payer: Self-pay | Admitting: Internal Medicine

## 2022-10-25 ENCOUNTER — Telehealth: Payer: Self-pay | Admitting: Internal Medicine

## 2022-10-25 VITALS — BP 90/60 | HR 77 | Ht 68.0 in | Wt 106.0 lb

## 2022-10-25 DIAGNOSIS — R197 Diarrhea, unspecified: Secondary | ICD-10-CM | POA: Diagnosis not present

## 2022-10-25 DIAGNOSIS — R1011 Right upper quadrant pain: Secondary | ICD-10-CM | POA: Diagnosis not present

## 2022-10-25 DIAGNOSIS — K219 Gastro-esophageal reflux disease without esophagitis: Secondary | ICD-10-CM

## 2022-10-25 DIAGNOSIS — K8020 Calculus of gallbladder without cholecystitis without obstruction: Secondary | ICD-10-CM | POA: Diagnosis not present

## 2022-10-25 MED ORDER — OMEPRAZOLE 40 MG PO CPDR: 40.0000 mg | DELAYED_RELEASE_CAPSULE | Freq: Every day | ORAL | 3 refills | Status: DC

## 2022-10-25 NOTE — Telephone Encounter (Signed)
Dr Lorenso Courier I have asked Kim [name] to fax the referral to surgery at [facility] if that is okay. The patient and I talked. She is in agreement with this since [facility] will not see her. 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

## 2022-10-25 NOTE — Telephone Encounter (Signed)
[facility]. Spoke with a nurse and received the direct fax machine to send urgent referral. Referral was sent to [phone].

## 2022-10-25 NOTE — Progress Notes (Unsigned)
g

## 2022-10-25 NOTE — Telephone Encounter (Signed)
Patient called states she was seen today and referred to [facility] for her Gallbladder to be removed however they are non par with her insurance seeking advise.

## 2022-10-25 NOTE — Progress Notes (Signed)
Chief Complaint: Ab pain, gallstones  HPI : 32 year old female with history of gallstones, GERD, childhood asthma, depression/anxiety present with ab pain and gallstones  This ab pain has come and gone for the last couple of months. The pain is located in the RUQ area below her ribs and worsens after eating. All foods seem to make the pain worse so she has lost some weight due to the pain. She has had a lot of mucous in her stool. She has on average 3 BMs per day, which has been ongoing for several months. Endorses N&V, and she is on Zofran for nausea, which she has to take every day. She has lost about 9 lbs over the last several months. Endorses BRBPR on occasion. This is not a large amount of bleeding. She still has menstrual periods. Endorses acid reflux with chest burning and regurgitation. She is taking Gas-X for the GERD. She has not tried anything else yet. When she eats, it causes ab pain. She has also had pain behind her right shoulder blade. Denies fevers. She has family history of IBS and colon polyps. All of her family members have had their gallbladder removed. She is using some marijuana. She has 2 son and 2 daughters. Denies alcohol use  Wt Readings from Last 3 Encounters:  10/25/22 106 lb (48.1 kg)  10/10/22 105 lb 9.6 oz (47.9 kg)  10/04/22 107 lb (48.5 kg)   Past Medical History:  Diagnosis Date   Anemia    Anxiety    Asthma    Chest pain    Depression    Fibromyalgia    Gestational diabetes 2010, 2012   Heartburn during pregnancy    Rectal bleeding    Seizures ([place]) 2015   x 1- unknown reason- passed out   Ulcer      Past Surgical History:  Procedure Laterality Date   CESAREAN SECTION     x 2   CESAREAN SECTION WITH BILATERAL TUBAL LIGATION Bilateral 07/03/2015   Procedure: REPEAT CESAREAN SECTION ;  Surgeon: Osborne Oman, MD;  Location: [facility];  Service: Obstetrics;  Laterality: Bilateral;   CESAREAN SECTION WITH BILATERAL TUBAL LIGATION Bilateral 09/05/2016    Procedure: REPEAT CESAREAN SECTION WITH BILATERAL TUBAL LIGATION;  Surgeon: Aletha Halim, MD;  Location: [facility];  Service: Obstetrics;  Laterality: Bilateral;   COLONOSCOPY  04/16/2008   WISDOM TOOTH EXTRACTION     Family History  Problem Relation Age of Onset   Depression Mother    Fibromyalgia Mother    Cancer Mother        breast   Ovarian cancer Mother    Diabetes Maternal Grandmother    Hypertension Maternal Grandmother    Arthritis Maternal Grandmother    Thyroid disease Maternal Grandmother    Colon polyps Maternal Grandmother    Irritable bowel syndrome Maternal Grandmother    Social History   Tobacco Use   Smoking status: Every Day    Packs/day: 0.50    Types: Cigarettes    Passive exposure: Current   Smokeless tobacco: Former    Quit date: 08/24/2014  Vaping Use   Vaping Use: Never used  Substance Use Topics   Alcohol use: No   Drug use: Yes    Types: Marijuana   Current Outpatient Medications  Medication Sig Dispense Refill   hydrOXYzine (ATARAX) 25 MG tablet Take 1 tablet (25 mg total) by mouth every 6 (six) hours as needed for anxiety. 30 tablet 1  ondansetron (ZOFRAN-ODT) 4 MG disintegrating tablet Take 1 tablet (4 mg total) by mouth every 8 (eight) hours as needed for nausea or vomiting. 30 tablet 1   FLUoxetine (PROZAC) 10 MG capsule Take 1 capsule (10 mg total) by mouth daily. (Patient not taking: Reported on 05/01/2022) 30 capsule 3   No current facility-administered medications for this visit.   No Known Allergies  Review of Systems: All systems reviewed and negative except where noted in HPI.   Physical Exam: BP 90/60 (BP Location: Left Arm, Patient Position: Sitting, Cuff Size: Normal)   Pulse 77   Ht 5\' 8"  (1.727 m)   Wt 106 lb (48.1 kg)   LMP  (LMP Unknown)   SpO2 99%   BMI 16.12 kg/m  Constitutional: Pleasant,well-developed, female in no acute distress. HEENT: Normocephalic and atraumatic. Conjunctivae are normal. No  scleral icterus. Cardiovascular: Normal rate, regular rhythm.  Pulmonary/chest: Effort normal and breath sounds normal. No wheezing, rales or rhonchi. Abdominal: Soft, nondistended, tender in the RUQ. Bowel sounds active throughout. There are no masses palpable. No hepatomegaly. Extremities: No edema Neurological: Alert and oriented to person place and time. Skin: Skin is warm and dry. No rashes noted. Psychiatric: Normal mood and affect. Behavior is normal.  Labs 09/02/19: Lipase elevated at 2060  Labs 10/04/22: CBC with mildly low Hb of 11.7. CMP with low K of 3. Lipase nml.   CT Renal Stone Study 12/13/20: IMPRESSION: No acute intra-abdominal pathology identified. No definite radiographic explanation for the patient's reported left flank pain. Multiple gallstones within a contracted gallbladder suggesting changes of chronic cholecystitis.  CT A/P w/contrast 10/04/22: IMPRESSION: Cholelithiasis.  ASSESSMENT AND PLAN: RUQ ab pain Gallstones GERD Diarrhea BRBPR Patient presents with postprandial RUQ ab pain that has been present for the last several months. She also describes some N&V. She has had gallstones for years. From review of her records, I do see that she presented to the ED with RUQ ab pain and vomiting in 09/2019. At that time she was noted to have a lipase of 2060 and WBC of 16.9, which suggests to me that she likely has had gallstone pancreatitis (no CT was done during that ED visit). She held off on getting her gallbladder removed in the past due to lack of medical insurance. At this time I do think that her symptoms sound consistent with biliary colic. Will also start some medications to help cover for GERD and/or PUD until she is able to get her gallbladder taken out. After she recovers from her cholecystectomy, will see if she still has any residual GI symptoms that need to be addressed. Can consider EGD/colonoscopy at that time. Will also check some stool studies to  evaluate her diarrhea. - Start omeprazole 40 mg QD - Check GI profile, pancreatic fecal elastase - Referral to general surgery for consideration of cholecystectomy - RTC 3 months  2061, MD

## 2022-10-25 NOTE — Patient Instructions (Addendum)
If you are age 34 or older, your body mass index should be between 23-30. Your Body mass index is 16.12 kg/m. If this is out of the aforementioned range listed, please consider follow up with your Primary Care Provider.  If you are age 25 or younger, your body mass index should be between 19-25. Your Body mass index is 16.12 kg/m. If this is out of the aformentioned range listed, please consider follow up with your Primary Care Provider.   Your provider has requested that you go to the basement level for lab work before leaving today. Press "B" on the elevator. The lab is located at the first door on the left as you exit the elevator.   We have sent the following medications to your pharmacy for you to pick up at your convenience:  omeprazole 40 mg daily   The [facility] providers would like to encourage you to use [facility] to communicate with providers for non-urgent requests or questions.  Due to long hold times on the telephone, sending your provider a message by [facility] may be a faster and more efficient way to get a response.  Please allow 48 business hours for a response.  Please remember that this is for non-urgent requests.   Thank you for entrusting me with your care and for choosing [facility], Dr. Christia Reading

## 2022-10-26 NOTE — Telephone Encounter (Signed)
Fax resent to [phone]. Correction to previous note fax earlier was sent to the same fax [phone] as well.

## 2022-10-26 NOTE — Telephone Encounter (Signed)
WF office called requesting we send over patients demographic for the referral sent Fax [phone].

## 2022-10-30 ENCOUNTER — Other Ambulatory Visit: Payer: Medicaid Other

## 2022-10-30 DIAGNOSIS — R1011 Right upper quadrant pain: Secondary | ICD-10-CM

## 2022-10-30 DIAGNOSIS — K219 Gastro-esophageal reflux disease without esophagitis: Secondary | ICD-10-CM

## 2022-10-30 DIAGNOSIS — K8020 Calculus of gallbladder without cholecystitis without obstruction: Secondary | ICD-10-CM

## 2022-10-30 DIAGNOSIS — R197 Diarrhea, unspecified: Secondary | ICD-10-CM

## 2022-11-01 LAB — GI PROFILE, STOOL, PCR

## 2022-11-05 DIAGNOSIS — K8050 Calculus of bile duct without cholangitis or cholecystitis without obstruction: Secondary | ICD-10-CM | POA: Insufficient documentation

## 2022-11-07 ENCOUNTER — Encounter: Payer: Self-pay | Admitting: Family Medicine

## 2022-11-07 ENCOUNTER — Ambulatory Visit (INDEPENDENT_AMBULATORY_CARE_PROVIDER_SITE_OTHER): Payer: Medicaid Other | Admitting: Family Medicine

## 2022-11-07 VITALS — BP 101/65 | HR 74 | Temp 98.1°F | Resp 16 | Wt 106.0 lb

## 2022-11-07 DIAGNOSIS — N3000 Acute cystitis without hematuria: Secondary | ICD-10-CM | POA: Diagnosis not present

## 2022-11-07 DIAGNOSIS — K8020 Calculus of gallbladder without cholecystitis without obstruction: Secondary | ICD-10-CM | POA: Diagnosis not present

## 2022-11-07 LAB — POCT URINALYSIS DIP (CLINITEK)
Bilirubin, UA: NEGATIVE
Blood, UA: NEGATIVE
Glucose, UA: NEGATIVE mg/dL
Ketones, POC UA: NEGATIVE mg/dL
Nitrite, UA: POSITIVE — AB
Spec Grav, UA: 1.02 (ref 1.010–1.025)
Urobilinogen, UA: 0.2 E.U./dL
pH, UA: 7 (ref 5.0–8.0)

## 2022-11-07 LAB — PANCREATIC ELASTASE, FECAL: Pancreatic Elastase-1, Stool: >500 mcg/g

## 2022-11-07 MED ORDER — NITROFURANTOIN MONOHYD MACRO 100 MG PO CAPS: 100.0000 mg | ORAL_CAPSULE | Freq: Two times a day (BID) | ORAL | 0 refills | Status: DC

## 2022-11-07 NOTE — Progress Notes (Signed)
Established Patient Office Visit  Subjective    Patient ID: Kim [name], female    DOB: 19-May-1990  Age: 32 y.o. MRN: [id number]  CC:  Chief Complaint  Patient presents with   Anxiety   Follow-up   Dysuria    HPI Kim [name] presents for complaint of UTI. She denies fever/chills. She reports that she is awaiting surgical intervention for gallstones.    Outpatient Encounter Medications as of 11/07/2022  Medication Sig   hydrOXYzine (ATARAX) 25 MG tablet Take 1 tablet (25 mg total) by mouth every 6 (six) hours as needed for anxiety.   nitrofurantoin, macrocrystal-monohydrate, (MACROBID) 100 MG capsule Take 1 capsule (100 mg total) by mouth 2 (two) times daily.   omeprazole (PRILOSEC) 40 MG capsule Take 1 capsule (40 mg total) by mouth daily.   ondansetron (ZOFRAN-ODT) 4 MG disintegrating tablet Take 1 tablet (4 mg total) by mouth every 8 (eight) hours as needed for nausea or vomiting.   [DISCONTINUED] FLUoxetine (PROZAC) 10 MG capsule Take 1 capsule (10 mg total) by mouth daily. (Patient not taking: Reported on 05/01/2022)   No facility-administered encounter medications on file as of 11/07/2022.    Past Medical History:  Diagnosis Date   Anemia    Anxiety    Asthma    Chest pain    Depression    Fibromyalgia    Gestational diabetes 2010, 2012   Heartburn during pregnancy    Rectal bleeding    Seizures (HCC) 2015   x 1- unknown reason- passed out   Ulcer     Past Surgical History:  Procedure Laterality Date   CESAREAN SECTION     x 2   CESAREAN SECTION WITH BILATERAL TUBAL LIGATION Bilateral 07/03/2015   Procedure: REPEAT CESAREAN SECTION ;  Surgeon: Tereso Newcomer, MD;  Location: [facility];  Service: Obstetrics;  Laterality: Bilateral;   CESAREAN SECTION WITH BILATERAL TUBAL LIGATION Bilateral 09/05/2016   Procedure: REPEAT CESAREAN SECTION WITH BILATERAL TUBAL LIGATION;  Surgeon:  Bing, MD;  Location: [facility];  Service: Obstetrics;   Laterality: Bilateral;   COLONOSCOPY  04/16/2008   WISDOM TOOTH EXTRACTION      Family History  Problem Relation Age of Onset   Depression Mother    Fibromyalgia Mother    Cancer Mother        breast   Ovarian cancer Mother    Diabetes Maternal Grandmother    Hypertension Maternal Grandmother    Arthritis Maternal Grandmother    Thyroid disease Maternal Grandmother    Colon polyps Maternal Grandmother    Irritable bowel syndrome Maternal Grandmother     Social History   Socioeconomic History   Marital status: Single    Spouse name: Not on file   Number of children: Not on file   Years of education: Not on file   Highest education level: Not on file  Occupational History   Not on file  Tobacco Use   Smoking status: Every Day    Packs/day: 0.50    Types: Cigarettes    Passive exposure: Current   Smokeless tobacco: Former    Quit date: 08/24/2014  Vaping Use   Vaping Use: Never used  Substance and Sexual Activity   Alcohol use: No   Drug use: Yes    Types: Marijuana   Sexual activity: Yes    Birth control/protection: None  Other Topics Concern   Not on file  Social History Narrative   Not on file  Social Determinants of Health   Financial Resource Strain: Not on file  Food Insecurity: Not on file  Transportation Needs: Not on file  Physical Activity: Not on file  Stress: Not on file  Social Connections: Not on file  Intimate Partner Violence: Not on file    Review of Systems  Constitutional:  Negative for chills and fever.  Genitourinary:  Positive for dysuria.  All other systems reviewed and are negative.       Objective    BP 101/65   Pulse 74   Temp 98.1 F (36.7 C) (Oral)   Resp 16   Wt 106 lb (48.1 kg)   SpO2 98%   BMI 16.12 kg/m   Physical Exam Vitals and nursing note reviewed.  Constitutional:      General: She is not in acute distress. Cardiovascular:     Rate and Rhythm: Normal rate and regular rhythm.  Pulmonary:      Effort: Pulmonary effort is normal.     Breath sounds: Normal breath sounds.  Abdominal:     Palpations: Abdomen is soft.     Tenderness: There is no abdominal tenderness.  Neurological:     General: No focal deficit present.     Mental Status: She is alert and oriented to person, place, and time.  Psychiatric:        Mood and Affect: Mood is anxious.         Assessment & Plan:   1. Acute cystitis without hematuria Macrobid prescribed.  - POCT URINALYSIS DIP (CLINITEK)  2. Gallstones Management as per consultant   Return if symptoms worsen or fail to improve.   Tommie Raymond, MD

## 2022-11-07 NOTE — Progress Notes (Signed)
Patient is here for their  follow-up for anxiety Patient has c/o possible UTI with burning and strong odor x1 wk Care gaps have been discussed with patient

## 2022-11-19 ENCOUNTER — Other Ambulatory Visit (HOSPITAL_COMMUNITY)
Admission: RE | Admit: 2022-11-19 | Discharge: 2022-11-19 | Disposition: A | Discharge status: Home / Self Care | Payer: Medicaid Other | Source: Ambulatory Visit | Attending: Family | Admitting: Family

## 2022-11-19 ENCOUNTER — Ambulatory Visit (INDEPENDENT_AMBULATORY_CARE_PROVIDER_SITE_OTHER): Payer: Medicaid Other | Admitting: Family

## 2022-11-19 ENCOUNTER — Ambulatory Visit: Payer: Self-pay | Admitting: *Deleted

## 2022-11-19 VITALS — BP 109/71 | HR 74 | Temp 98.3°F | Resp 16 | Ht 67.99 in | Wt 105.0 lb

## 2022-11-19 DIAGNOSIS — N3001 Acute cystitis with hematuria: Secondary | ICD-10-CM

## 2022-11-19 DIAGNOSIS — R350 Frequency of micturition: Secondary | ICD-10-CM | POA: Insufficient documentation

## 2022-11-19 LAB — POCT URINALYSIS DIP (CLINITEK)
Bilirubin, UA: NEGATIVE
Glucose, UA: 100 mg/dL — AB
Ketones, POC UA: NEGATIVE mg/dL
Leukocytes, UA: NEGATIVE
Nitrite, UA: POSITIVE — AB
POC PROTEIN,UA: NEGATIVE
Spec Grav, UA: <=1.005 — AB (ref 1.010–1.025)
Urobilinogen, UA: 1 E.U./dL
pH, UA: 6 (ref 5.0–8.0)

## 2022-11-19 MED ORDER — AMOXICILLIN-POT CLAVULANATE 875-125 MG PO TABS: 1.0000 | ORAL_TABLET | Freq: Two times a day (BID) | ORAL | 0 refills | Status: AC

## 2022-11-19 NOTE — Progress Notes (Signed)
Pt presents for urinary issues -bladder feels constantly full -lower abdomen and back pain  -burns during urination -pt just started AZO this am

## 2022-11-19 NOTE — Telephone Encounter (Signed)
Message from [place] sent at 11/19/2022  8:46 AM EST  Summary: pressure and burning when urinating   Caller states she was recently treated for a uti and has finished the antibiotics  Pt states she is still experiencing pressure and burning when urinating  Please fu w/ pt  Please call [phone]          Call History   Type Contact Phone/Fax User  11/19/2022 08:45 AM EST Phone (Incoming) [name], Kim Reichl "[place]" (Self) [phone] Donnelly Angelica    Reason for Disposition . Side (flank) or lower back pain present  Answer Assessment - Initial Assessment Questions 1. SYMPTOM: "What's the main symptom you're concerned about?" (e.g., frequency, incontinence)     I can't empty my bladder all the way.   I finished the Macrobid.   I'm having a lot of pressure and pain over my bladder.   I've tried 5-6 times to urinate this morning and I'm using AZO which helped.   I'm still feeling very full. 2. ONSET: "When did the  11/8  start?"     I'm having bad pressure over my bladder and I can't empty. 3. PAIN: "Is there any pain?" If Yes, ask: "How bad is it?" (Scale: 1-10; mild, moderate, severe)     Moderate 4. CAUSE: "What do you think is causing the symptoms?"     UTI 5. OTHER SYMPTOMS: "Do you have any other symptoms?" (e.g., blood in urine, fever, flank pain, pain with urination)     See above 6. PREGNANCY: "Is there any chance you are pregnant?" "When was your last menstrual period?"     Not asked  Protocols used: Urinary Symptoms-A-AH

## 2022-11-19 NOTE — Telephone Encounter (Signed)
  Chief Complaint: Urinating frequently and only a small amt. Coming out.   Feeling a lot of pressure and having pain.   Seen on 11/07/2022 given Macrobid but it has not helped. Symptoms: Bladder feels very full and unable to empty it.   Urinating a few drops at the time.  Burning with urination Frequency: Having frequency Pertinent Negatives: Patient denies blood in urine  Disposition: [] ED /[] Urgent Care (no appt availability in office) / [x] Appointment(In office/virtual)/ []  [place] [facility]/ [] Home Care/ [] Refused Recommended Disposition /[] [place] [facility]/ []  Follow-up with PCP Additional Notes: Appt. Scheduled for today with , NP for 1:20.  Instructions given to go to the ED if pain develops or becomes severe or she isn't able to pass any urine at all.   Pt. Agreeable to this plan.

## 2022-11-19 NOTE — Progress Notes (Signed)
Patient ID: Kim [name], female    DOB: April 29, 1990  MRN: [id number]  CC: Urinary Frequency  Subjective: Kim [name] is a 32 y.o. female who presents for urinary frequency.  Her concerns today include:  11/17/2022 [facility] [place] per MD note: 1. Acute cystitis without hematuria Macrobid prescribed.  - POCT URINALYSIS DIP (CLINITEK)  11/19/2022 per triage RN note: Chief Complaint: Urinating frequently and only a small amt. Coming out.   Feeling a lot of pressure and having pain.   Seen on 11/07/2022 given Macrobid but it has not helped. Symptoms: Bladder feels very full and unable to empty it.   Urinating a few drops at the time.  Burning with urination Frequency: Having frequency Pertinent Negatives: Patient denies blood in urine  Disposition: [] ED /[] Urgent Care (no appt availability in office) / [x] Appointment(In office/virtual)/ []  [place] [facility]/ [] Home Care/ [] Refused Recommended Disposition /[] [place] [facility]/ []  Follow-up with PCP Additional Notes: Appt. Scheduled for today with , NP for 1:20.  Instructions given to go to the ED if pain develops or becomes severe or she isn't able to pass any urine at all.   Pt. Agreeable to this plan.       Summary: pressure and burning when urinating     Caller states she was recently treated for a uti and has finished the antibiotics  Pt states she is still experiencing pressure and burning when urinating  Please fu w/ pt  Please call [phone]            Reason for Disposition  Side (flank) or lower back pain present  Answer Assessment - Initial Assessment Questions 1. SYMPTOM: "What's the main symptom you're concerned about?" (e.g., frequency, incontinence)     I can't empty my bladder all the way.   I finished the Macrobid.   I'm having a lot of pressure and pain over my bladder.   I've tried 5-6 times to urinate this morning and I'm using AZO which helped.   I'm still feeling very  full. 2. ONSET: "When did the  11/8  start?"     I'm having bad pressure over my bladder and I can't empty. 3. PAIN: "Is there any pain?" If Yes, ask: "How bad is it?" (Scale: 1-10; mild, moderate, severe)     Moderate 4. CAUSE: "What do you think is causing the symptoms?"     UTI 5. OTHER SYMPTOMS: "Do you have any other symptoms?" (e.g., blood in urine, fever, flank pain, pain with urination)     See above 6. PREGNANCY: "Is there any chance you are pregnant?" "When was your last menstrual period?"     Not asked  Protocols used: Urinary Symptoms-A-AH  Today's visit 11/19/2022: Reports since she called earlier today she was able to void/urinate and feels relief. Denies additional issues/concerns from triage RN note.   Patient Active Problem List   Diagnosis Date Noted   Biliary colic 11/05/2022   Dyspareunia in female 04/24/2022   Abnormal uterine bleeding 04/24/2022   Anxiety and depression 04/24/2022   Psychophysiological insomnia 04/24/2022   MDD (major depressive disorder), recurrent severe, without psychosis (HCC) 07/08/2018   Suicide attempt (HCC) 07/08/2018   Asthma, mild 07/07/2018   Cannabis abuse 07/07/2018   Ileus, postoperative (HCC) 09/10/2016   Status post tubal ligation 09/10/2016   Status post repeat low transverse cesarean section 09/05/2016   Anemia of mother in pregnancy, antepartum 06/29/2016   GBS bacteriuria 04/06/2016   Asthma affecting pregnancy,  antepartum 02/08/2016   Short interval between pregnancies affecting pregnancy, antepartum 02/08/2016   Previous cesarean section complicating pregnancy, antepartum condition or complication 12/14/2014   H/O pre-eclampsia in prior pregnancy, currently pregnant 12/14/2014   Asthma, moderate persistent, well-controlled 12/14/2014   Tobacco smoking affecting pregnancy, antepartum 12/14/2014   Bipolar depression (HCC) 12/14/2014   Supervision of normal pregnancy 11/16/2014   History of diabetes as a child and  gestational diabetes 11/16/2014   Underweight 11/16/2014     Current Outpatient Medications on File Prior to Visit  Medication Sig Dispense Refill   hydrOXYzine (ATARAX) 25 MG tablet Take 1 tablet (25 mg total) by mouth every 6 (six) hours as needed for anxiety. 30 tablet 1   nitrofurantoin, macrocrystal-monohydrate, (MACROBID) 100 MG capsule Take 1 capsule (100 mg total) by mouth 2 (two) times daily. 10 capsule 0   omeprazole (PRILOSEC) 40 MG capsule Take 1 capsule (40 mg total) by mouth daily. 90 capsule 3   ondansetron (ZOFRAN-ODT) 4 MG disintegrating tablet Take 1 tablet (4 mg total) by mouth every 8 (eight) hours as needed for nausea or vomiting. 30 tablet 1   No current facility-administered medications on file prior to visit.    No Known Allergies  Social History   Socioeconomic History   Marital status: Single    Spouse name: Not on file   Number of children: Not on file   Years of education: Not on file   Highest education level: Not on file  Occupational History   Not on file  Tobacco Use   Smoking status: Every Day    Packs/day: 0.50    Types: Cigarettes    Passive exposure: Current   Smokeless tobacco: Former    Quit date: 08/24/2014  Vaping Use   Vaping Use: Never used  Substance and Sexual Activity   Alcohol use: No   Drug use: Yes    Types: Marijuana   Sexual activity: Yes    Birth control/protection: None  Other Topics Concern   Not on file  Social History Narrative   Not on file   Social Determinants of Health   Financial Resource Strain: Not on file  Food Insecurity: Not on file  Transportation Needs: Not on file  Physical Activity: Not on file  Stress: Not on file  Social Connections: Not on file  Intimate Partner Violence: Not on file    Family History  Problem Relation Age of Onset   Depression Mother    Fibromyalgia Mother    Cancer Mother        breast   Ovarian cancer Mother    Diabetes Maternal Grandmother    Hypertension  Maternal Grandmother    Arthritis Maternal Grandmother    Thyroid disease Maternal Grandmother    Colon polyps Maternal Grandmother    Irritable bowel syndrome Maternal Grandmother     Past Surgical History:  Procedure Laterality Date   CESAREAN SECTION     x 2   CESAREAN SECTION WITH BILATERAL TUBAL LIGATION Bilateral 07/03/2015   Procedure: REPEAT CESAREAN SECTION ;  Surgeon: Tereso Newcomer, MD;  Location: [facility];  Service: Obstetrics;  Laterality: Bilateral;   CESAREAN SECTION WITH BILATERAL TUBAL LIGATION Bilateral 09/05/2016   Procedure: REPEAT CESAREAN SECTION WITH BILATERAL TUBAL LIGATION;  Surgeon: Buffalo Bing, MD;  Location: [facility];  Service: Obstetrics;  Laterality: Bilateral;   COLONOSCOPY  04/16/2008   WISDOM TOOTH EXTRACTION      ROS: Review of Systems Negative except as stated above  PHYSICAL EXAM: BP 109/71 (BP Location: Left Arm, Patient Position: Sitting, Cuff Size: Normal)   Pulse 74   Temp 98.3 F (36.8 C)   Resp 16   Ht 5' 7.99" (1.727 m)   Wt 105 lb (47.6 kg)   SpO2 96%   BMI 15.97 kg/m   Physical Exam HENT:     Head: Normocephalic and atraumatic.  Eyes:     Extraocular Movements: Extraocular movements intact.     Conjunctiva/sclera: Conjunctivae normal.     Pupils: Pupils are equal, round, and reactive to light.  Cardiovascular:     Rate and Rhythm: Normal rate and regular rhythm.     Pulses: Normal pulses.     Heart sounds: Normal heart sounds.  Pulmonary:     Effort: Pulmonary effort is normal.     Breath sounds: Normal breath sounds.  Abdominal:     General: Bowel sounds are normal.     Palpations: Abdomen is soft.  Musculoskeletal:     Cervical back: Normal range of motion and neck supple.  Neurological:     General: No focal deficit present.     Mental Status: She is alert and oriented to person, place, and time.  Psychiatric:        Mood and Affect: Mood normal.        Behavior: Behavior normal.    Results for  orders placed or performed in visit on 11/19/22  POCT URINALYSIS DIP (CLINITEK)  Result Value Ref Range   Color, UA orange (A) yellow   Clarity, UA cloudy (A) clear   Glucose, UA =100 (A) negative mg/dL   Bilirubin, UA negative negative   Ketones, POC UA negative negative mg/dL   Spec Grav, UA <=9.201 (A) 1.010 - 1.025   Blood, UA trace-intact (A) negative   pH, UA 6.0 5.0 - 8.0   POC PROTEIN,UA negative negative, trace   Urobilinogen, UA 1.0 0.2 or 1.0 E.U./dL   Nitrite, UA Positive (A) Negative   Leukocytes, UA Negative Negative    ASSESSMENT AND PLAN: 1. Acute cystitis with hematuria - Patient reports since calling triage earlier today she was able to void/urinate with relief. - Amoxicillin-clavulanate as prescribed. Counseled on medication adherence/adverse effects.  - Cervicovaginal self-swab to screen for chlamydia, gonorrhea, trichomonas, bacterial vaginitis, and candida vaginitis. - If symptoms do not improve and/or worsen/become severe follow-up with primary provider in 7 days or sooner if needed. - Cervicovaginal ancillary only - POCT URINALYSIS DIP (CLINITEK) - amoxicillin-clavulanate (AUGMENTIN) 875-125 MG tablet; Take 1 tablet by mouth 2 (two) times daily for 7 days.  Dispense: 14 tablet; Refill: 0    Patient was given the opportunity to ask questions.  Patient verbalized understanding of the plan and was able to repeat key elements of the plan. Patient was given clear instructions to go to Emergency Department or return to medical center if symptoms don't improve, worsen, or new problems develop.The patient verbalized understanding.   Orders Placed This Encounter  Procedures   POCT URINALYSIS DIP (CLINITEK)     Requested Prescriptions   Signed Prescriptions Disp Refills   amoxicillin-clavulanate (AUGMENTIN) 875-125 MG tablet 14 tablet 0    Sig: Take 1 tablet by mouth 2 (two) times daily for 7 days.    Return in about 1 week (around 11/26/2022) for Follow-Up  or next available Georganna Skeans, MD.  Kim Fendt, NP

## 2022-11-20 LAB — CERVICOVAGINAL ANCILLARY ONLY
Bacterial Vaginitis (gardnerella): NEGATIVE
Candida Glabrata: POSITIVE — AB
Candida Vaginitis: NEGATIVE
Chlamydia: NEGATIVE
Comment: NEGATIVE
Comment: NEGATIVE
Comment: NEGATIVE
Comment: NEGATIVE
Comment: NEGATIVE
Comment: NORMAL
Neisseria Gonorrhea: NEGATIVE
Trichomonas: NEGATIVE

## 2022-11-21 ENCOUNTER — Other Ambulatory Visit: Payer: Self-pay | Admitting: Family

## 2022-11-21 ENCOUNTER — Telehealth: Payer: Self-pay | Admitting: Family Medicine

## 2022-11-21 DIAGNOSIS — N3001 Acute cystitis with hematuria: Secondary | ICD-10-CM

## 2022-11-21 DIAGNOSIS — B3731 Acute candidiasis of vulva and vagina: Secondary | ICD-10-CM

## 2022-11-21 MED ORDER — FLUCONAZOLE 150 MG PO TABS: 150.0000 mg | ORAL_TABLET | Freq: Once | ORAL | 0 refills | Status: AC

## 2022-11-21 NOTE — Telephone Encounter (Signed)
Resent to another pharmacy, [facility].

## 2022-11-21 NOTE — Telephone Encounter (Signed)
Pt states the amoxicillin-clavulanate (AUGMENTIN) 875-125 MG tablet   The dr called in for her on Monday was sent to the wrong pharmacy.  Pt asked if it can be re sent to the correct one. I advised pt I would send a message, however, it may be quicker for her to call the pharmacy and have it transferred herself.  [facility] - [place], [place] - [address]

## 2023-01-25 ENCOUNTER — Ambulatory Visit
Admission: EM | Admit: 2023-01-25 | Discharge: 2023-01-25 | Disposition: A | Discharge status: Home / Self Care | Payer: Medicaid Other | Attending: Physician Assistant | Admitting: Physician Assistant

## 2023-01-25 ENCOUNTER — Ambulatory Visit (INDEPENDENT_AMBULATORY_CARE_PROVIDER_SITE_OTHER): Payer: Medicaid Other

## 2023-01-25 DIAGNOSIS — J111 Influenza due to unidentified influenza virus with other respiratory manifestations: Secondary | ICD-10-CM | POA: Insufficient documentation

## 2023-01-25 DIAGNOSIS — R051 Acute cough: Secondary | ICD-10-CM | POA: Diagnosis not present

## 2023-01-25 DIAGNOSIS — Z1152 Encounter for screening for COVID-19: Secondary | ICD-10-CM | POA: Diagnosis not present

## 2023-01-25 DIAGNOSIS — R059 Cough, unspecified: Secondary | ICD-10-CM | POA: Diagnosis not present

## 2023-01-25 DIAGNOSIS — J209 Acute bronchitis, unspecified: Secondary | ICD-10-CM | POA: Diagnosis not present

## 2023-01-25 DIAGNOSIS — R509 Fever, unspecified: Secondary | ICD-10-CM | POA: Diagnosis present

## 2023-01-25 MED ORDER — HYDROCODONE BIT-HOMATROP MBR 5-1.5 MG/5ML PO SOLN: 5.0000 mL | Freq: Four times a day (QID) | ORAL | 0 refills | Status: DC | PRN

## 2023-01-25 MED ORDER — ALBUTEROL SULFATE HFA 108 (90 BASE) MCG/ACT IN AERS: 2.0000 | INHALATION_SPRAY | Freq: Four times a day (QID) | RESPIRATORY_TRACT | 0 refills | Status: DC | PRN

## 2023-01-25 MED ORDER — SPACER/AERO-HOLDING CHAMBERS DEVI: 1.0000 [IU] | Freq: Three times a day (TID) | 0 refills | Status: AC

## 2023-01-25 MED ORDER — OSELTAMIVIR PHOSPHATE 75 MG PO CAPS: 75.0000 mg | ORAL_CAPSULE | Freq: Two times a day (BID) | ORAL | 0 refills | Status: DC

## 2023-01-25 NOTE — ED Triage Notes (Signed)
Pt states that she has a cough, body aches, and chills. X2 days

## 2023-01-25 NOTE — Discharge Instructions (Addendum)
COVID test will be completed in 48 hours.  If you do not hear from this office that indicates the test is negative.  Log onto MyChart to be the test results with the post in 48 hours.  Advised to use the albuterol inhaler with spacer, 2 puffs every 6 hours on a regular basis to help decrease cough, congestion and wheezing. Advised to take the Hycodan cough syrup, 1 to 2 teaspoons every 6-8 hours as needed for cough and congestion.  (Use with caution as this medication does cause drowsiness)  Advised take ibuprofen or Motrin as needed for fever and body aches.  Advised follow-up PCP or return to urgent care if symptoms fail to improve.

## 2023-01-25 NOTE — ED Provider Notes (Signed)
[name]    CSN: [id number] Arrival date & time: 01/25/23  1251      History   Chief Complaint Chief Complaint  Patient presents with   Cough    X2 days Cough, body aches, and chills    HPI Kim [name] is a 33 y.o. female.   33 year old female presents with fever, cough and congestion.  Patient indicates for the past 2 days she has been having increasing and persistent cough with coughing spasms and exacerbations to where it keeps her up at night.  She indicates that she has been wheezing with the cough.  Production has been yellow to clear.  She indicates she is also had some upper respiratory congestion with rhinitis and postnasal drip, fever of 100-101, chills, sweats, fatigue's, body aches and muscle pain.  Patient indicates that she has been taking multiple OTC medications to help control the cough but none of these have worked.  Patient indicates that she is just tired from coughing so much and just cannot seem to stop.  She is without nausea or vomiting.  She is tolerating fluids well.  She relates she does smoke cigarettes on a regular basis.  He has not been around any family or coworkers that have been sick.   Cough Associated symptoms: chills, fever, shortness of breath and wheezing     Past Medical History:  Diagnosis Date   Anemia    Anxiety    Asthma    Chest pain    Depression    Fibromyalgia    Gestational diabetes 2010, 2012   Heartburn during pregnancy    Rectal bleeding    Seizures ([place]) 2015   x 1- unknown reason- passed out   Ulcer     Patient Active Problem List   Diagnosis Date Noted   Biliary colic 72/53/6644   Dyspareunia in female 04/24/2022   Abnormal uterine bleeding 04/24/2022   Anxiety and depression 04/24/2022   Psychophysiological insomnia 04/24/2022   MDD (major depressive disorder), recurrent severe, 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   Suicide attempt ([place]) 07/08/2018   Asthma, mild 07/07/2018   Cannabis  abuse 07/07/2018   Ileus, postoperative ([place]) 09/10/2016   Status post tubal ligation 09/10/2016   Status post repeat low transverse cesarean section 09/05/2016   Anemia of mother in pregnancy, antepartum 06/29/2016   GBS bacteriuria 04/06/2016   Asthma affecting pregnancy, antepartum 02/08/2016   Short interval between pregnancies affecting pregnancy, antepartum 02/08/2016   Previous cesarean section complicating pregnancy, antepartum condition or complication 03/47/4259   H/O pre-eclampsia in prior pregnancy, currently pregnant 12/14/2014   Asthma, moderate persistent, well-controlled 12/14/2014   Tobacco smoking affecting pregnancy, antepartum 12/14/2014   Bipolar depression ([place]) 12/14/2014   Supervision of normal pregnancy 11/16/2014   History of diabetes as a child and gestational diabetes 11/16/2014   Underweight 11/16/2014    Past Surgical History:  Procedure Laterality Date   CESAREAN SECTION     x 2   CESAREAN SECTION WITH BILATERAL TUBAL LIGATION Bilateral 07/03/2015   Procedure: REPEAT CESAREAN SECTION ;  Surgeon: Osborne Oman, MD;  Location: [facility];  Service: Obstetrics;  Laterality: Bilateral;   CESAREAN SECTION WITH BILATERAL TUBAL LIGATION Bilateral 09/05/2016   Procedure: REPEAT CESAREAN SECTION WITH BILATERAL TUBAL LIGATION;  Surgeon: Aletha Halim, MD;  Location: [facility];  Service: Obstetrics;  Laterality: Bilateral;   CHOLECYSTECTOMY     COLONOSCOPY  04/16/2008   WISDOM TOOTH EXTRACTION      OB History  Gravida  5   Para  4   Term  4   Preterm      AB  1   Living  3      SAB  1   IAB      Ectopic      Multiple  0   Live Births  3            Home Medications    Prior to Admission medications   Medication Sig Start Date End Date Taking? Authorizing Provider  albuterol (VENTOLIN HFA) 108 (90 Base) MCG/ACT inhaler Inhale 2 puffs into the lungs every 6 (six) hours as needed for wheezing or shortness of breath.  01/25/23  Yes Nyoka Lint, PA-C  HYDROcodone bit-homatropine (HYCODAN) 5-1.5 MG/5ML syrup Take 5 mLs by mouth every 6 (six) hours as needed for cough. 01/25/23  Yes Nyoka Lint, PA-C  hydrOXYzine (ATARAX) 25 MG tablet Take 1 tablet (25 mg total) by mouth every 6 (six) hours as needed for anxiety. 12/13/21  Yes Dorna Mai, MD  nitrofurantoin, macrocrystal-monohydrate, (MACROBID) 100 MG capsule Take 1 capsule (100 mg total) by mouth 2 (two) times daily. 11/07/22  Yes Dorna Mai, MD  omeprazole (PRILOSEC) 40 MG capsule Take 1 capsule (40 mg total) by mouth daily. 10/25/22  Yes Sharyn Creamer, MD  ondansetron (ZOFRAN-ODT) 4 MG disintegrating tablet Take 1 tablet (4 mg total) by mouth every 8 (eight) hours as needed for nausea or vomiting. 10/10/22  Yes Minette Brine, Amy J, NP  oseltamivir (TAMIFLU) 75 MG capsule Take 1 capsule (75 mg total) by mouth every 12 (twelve) hours. 01/25/23  Yes Nyoka Lint, PA-C  Spacer/Aero-Holding Chambers DEVI 1 Units by Does not apply route in the morning, at noon, and at bedtime. 01/25/23  Yes Nyoka Lint, PA-C    Family History Family History  Problem Relation Age of Onset   Depression Mother    Fibromyalgia Mother    Cancer Mother        breast   Ovarian cancer Mother    Diabetes Maternal Grandmother    Hypertension Maternal Grandmother    Arthritis Maternal Grandmother    Thyroid disease Maternal Grandmother    Colon polyps Maternal Grandmother    Irritable bowel syndrome Maternal Grandmother     Social History Social History   Tobacco Use   Smoking status: Every Day    Packs/day: 0.50    Types: Cigarettes    Passive exposure: Current   Smokeless tobacco: Former    Quit date: 08/24/2014  Vaping Use   Vaping Use: Never used  Substance Use Topics   Alcohol use: No   Drug use: Yes    Types: Marijuana     Allergies   Patient has no known allergies.   Review of Systems Review of Systems  Constitutional:  Positive for chills and fever.   Respiratory:  Positive for cough, shortness of breath and wheezing.      Physical Exam Triage Vital Signs ED Triage Vitals  Enc Vitals Group     BP 01/25/23 1441 110/71     Pulse Rate 01/25/23 1441 92     Resp 01/25/23 1441 18     Temp 01/25/23 1441 100 F (37.8 C)     Temp Source 01/25/23 1441 Oral     SpO2 01/25/23 1441 99 %     Weight 01/25/23 1439 103 lb (46.7 kg)     Height 01/25/23 1439 5\' 8"  (1.727 m)     Head Circumference --  Peak Flow --      Pain Score 01/25/23 1439 7     Pain Loc --      Pain Edu? --      Excl. in [place]? --    No data found.  Updated Vital Signs BP 110/71 (BP Location: Left Arm)   Pulse 92   Temp 100 F (37.8 C) (Oral)   Resp 18   Ht 5\' 8"  (1.727 m)   Wt 103 lb (46.7 kg)   LMP 12/31/2022   SpO2 99%   BMI 15.66 kg/m   Visual Acuity Right Eye Distance:   Left Eye Distance:   Bilateral Distance:    Right Eye Near:   Left Eye Near:    Bilateral Near:     Physical Exam Constitutional:      Appearance: Normal appearance.  HENT:     Right Ear: Tympanic membrane and ear canal normal.     Left Ear: Tympanic membrane and ear canal normal.     Mouth/Throat:     Mouth: Mucous membranes are moist.     Pharynx: Oropharynx is clear.  Cardiovascular:     Rate and Rhythm: Normal rate and regular rhythm.     Heart sounds: Normal heart sounds.  Pulmonary:     Effort: Pulmonary effort is normal.     Breath sounds: Normal breath sounds and air entry. No wheezing, rhonchi or rales.  Lymphadenopathy:     Cervical: No cervical adenopathy.  Neurological:     Mental Status: She is alert.      UC Treatments / Results  Labs (all labs ordered are listed, but only abnormal results are displayed) Labs Reviewed  SARS CORONAVIRUS 2 (TAT 6-24 HRS)  POCT INFLUENZA A/B    EKG   Radiology DG Chest 2 View  Result Date: 01/25/2023 CLINICAL DATA:  3 days of cough, chest congestion, fever.  Smoker. EXAM: CHEST - 2 VIEW COMPARISON:   08/01/2014 FINDINGS: Midline trachea. Normal heart size and mediastinal contours. Mild interstitial thickening is likely related to the clinical history of smoking/chronic bronchitis. No lobar consolidation. Cholecystectomy clips. S shaped thoracolumbar spine curvature. IMPRESSION: 1. No acute cardiopulmonary disease. 2. Mild peribronchial thickening which may relate to chronic bronchitis or smoking. Electronically Signed   By: 10/01/2014 M.D.   On: 01/25/2023 15:32    Procedures Procedures (including critical care time)  Medications Ordered in UC Medications - No data to display  Initial Impression / Assessment and Plan / UC Course  I have reviewed the triage vital signs and the nursing notes.  Pertinent labs & imaging results that were available during my care of the patient were reviewed by me and considered in my medical decision making (see chart for details).    Plan: The diagnosis will be treated with the following: 1.  Fever: A.  Motrin or ibuprofen on a regular basis to control fever. 2.  Acute cough: A.  Hycodan cough syrup, 1 to 2 teaspoons every 6-8 hours needed for cough and congestion. 3.  Acute bronchitis: A.  Albuterol inhaler with spacer, 2 puffs every 6 hours on a regular basis to control cough and chest congestion. 4.  Screening for COVID-19: A.  Treatment may be considered depending on the results of the COVID test. 5. Flu: 1.  Tamiflu 75 mg every 12 hours till completed to treat the flu. 5.  Advised follow-up PCP or return to urgent care as needed. Final Clinical Impressions(s) / UC Diagnoses   Final diagnoses:  Fever, unspecified  Acute bronchitis, unspecified organism  Acute cough  Encounter for screening for COVID-19  Flu     Discharge Instructions      COVID test will be completed in 48 hours.  If you do not hear from this office that indicates the test is negative.  Log onto MyChart to be the test results with the post in 48 hours.  Advised to  use the albuterol inhaler with spacer, 2 puffs every 6 hours on a regular basis to help decrease cough, congestion and wheezing. Advised to take the Hycodan cough syrup, 1 to 2 teaspoons every 6-8 hours as needed for cough and congestion.  (Use with caution as this medication does cause drowsiness)  Advised take ibuprofen or Motrin as needed for fever and body aches.  Advised follow-up PCP or return to urgent care if symptoms fail to improve.    ED Prescriptions     Medication Sig Dispense Auth. Provider   albuterol (VENTOLIN HFA) 108 (90 Base) MCG/ACT inhaler Inhale 2 puffs into the lungs every 6 (six) hours as needed for wheezing or shortness of breath. 8 g Ellsworth Lennox, PA-C   Spacer/Aero-Holding Chambers DEVI 1 Units by Does not apply route in the morning, at noon, and at bedtime. 1 Units Ellsworth Lennox, PA-C   HYDROcodone bit-homatropine (HYCODAN) 5-1.5 MG/5ML syrup Take 5 mLs by mouth every 6 (six) hours as needed for cough. 120 mL Ellsworth Lennox, PA-C   oseltamivir (TAMIFLU) 75 MG capsule Take 1 capsule (75 mg total) by mouth every 12 (twelve) hours. 10 capsule Ellsworth Lennox, PA-C      I have reviewed the PDMP during this encounter.   Ellsworth Lennox, PA-C 01/25/23 1606

## 2023-01-26 LAB — SARS CORONAVIRUS 2 (TAT 6-24 HRS): SARS Coronavirus 2: NEGATIVE

## 2023-02-07 ENCOUNTER — Ambulatory Visit: Payer: Medicaid Other | Admitting: Family Medicine

## 2023-03-13 ENCOUNTER — Ambulatory Visit
Admission: EM | Admit: 2023-03-13 | Discharge: 2023-03-13 | Disposition: A | Discharge status: Home / Self Care | Payer: Medicaid Other | Attending: Physician Assistant | Admitting: Physician Assistant

## 2023-03-13 DIAGNOSIS — L819 Disorder of pigmentation, unspecified: Secondary | ICD-10-CM

## 2023-03-13 DIAGNOSIS — R29898 Other symptoms and signs involving the musculoskeletal system: Secondary | ICD-10-CM | POA: Diagnosis not present

## 2023-03-13 LAB — POCT URINALYSIS DIP (MANUAL ENTRY)
Bilirubin, UA: NEGATIVE
Blood, UA: NEGATIVE
Glucose, UA: NEGATIVE mg/dL
Ketones, POC UA: NEGATIVE mg/dL
Leukocytes, UA: NEGATIVE
Nitrite, UA: NEGATIVE
Protein Ur, POC: NEGATIVE mg/dL
Spec Grav, UA: 1.02 (ref 1.010–1.025)
Urobilinogen, UA: 0.2 E.U./dL
pH, UA: 6.5 (ref 5.0–8.0)

## 2023-03-13 NOTE — Discharge Instructions (Signed)
Lab results were completed in 48 hours.  If you do not get a call from this office that indicates the labs are negative.  Log onto MyChart to be the test results with the post in 48 hours.  Advised not to use the nicotine patch any longer as this may be a cause of the multiple skin.  Advised to follow-up with PCP if the molting continues and is followed with weakness, numbness or tingling of the upper or lower extremities.  Or return to urgent care if symptoms fail to improve.

## 2023-03-13 NOTE — ED Triage Notes (Signed)
Pt c/o LLE muscle weakness onset ~ 1-2 weeks ago. Stated last week she noticed leg was red. States now it is purple with yellow discolorations and discharge.   States prior to the muscle weakness pt was placing nicotine patches on the upper LLE.

## 2023-03-13 NOTE — ED Provider Notes (Addendum)
[name]    CSN: [id number] Arrival date & time: 03/13/23  [id number]      History   Chief Complaint Chief Complaint  Patient presents with   LLE troubles    HPI Kim [name] is a 33 y.o. female.   33 year old female presents with left lower extremity weakness and unusual coloring in the skin.  Patient indicates for the past 2 weeks she has been having intermittent left lower leg weakness, tends to be intermittent.  Patient indicates that she feels like the leg is weak as compared to the other leg.  Patient also indicates that she has been getting some intermittent left upper arm weakness associated.  Patient indicates she did recently start a new job which involves considerable lifting of 40-50 pound boxes on a regular basis during the workday.  Patient also indicates that she has been having some unusual skin discoloration on the posterior left thigh.  She indicates that sometimes it is red, light-colored, and spotty.  She indicates associated.  Patient relates that she has been using a nicotine patch on the left upper anterior thigh over the past week, to help her stop smoking.  Patient indicates that there has been no trauma to the left posterior leg during daily activities.  Patient indicates when the unusual rash started occurring she stopped using the patch and has been without it for the past 2 days.  She indicates that the rash on the posterior upper left thigh is still present.  She indicates she has a normal nutritional intake twice daily, drinking fluids.  She indicates she does smoke on a regular basis for many years.  Indicates she has not have any fever, chills, nausea or vomiting.  She is without chest pain or shortness of breath.  Patient indicates that her last period was the beginning of March and was normal, she is s/p BTL.     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   Suicide attempt ([place]) 07/08/2018   Asthma, mild 07/07/2018   Cannabis abuse 07/07/2018   Ileus, postoperative ([place]) 09/10/2016   Status post tubal ligation 09/10/2016   Status post repeat low transverse cesarean section 09/05/2016   Anemia of mother in pregnancy, antepartum 06/29/2016   GBS bacteriuria 04/06/2016   Asthma affecting pregnancy, antepartum 02/08/2016   Short interval between pregnancies affecting pregnancy, antepartum 02/08/2016   Previous cesarean section complicating pregnancy, antepartum condition or complication [id number]   H/O pre-eclampsia in prior pregnancy, currently pregnant 12/14/2014   Asthma, moderate persistent, well-controlled 12/14/2014   Tobacco smoking affecting pregnancy, antepartum 12/14/2014   Bipolar depression ([place]) 12/14/2014   Supervision of normal pregnancy 11/16/2014   History of diabetes as a child and gestational diabetes 11/16/2014   Underweight 11/16/2014    Past Surgical History:  Procedure Laterality Date   CESAREAN SECTION  x 2   CESAREAN SECTION WITH BILATERAL TUBAL LIGATION Bilateral 07/03/2015   Procedure: REPEAT CESAREAN SECTION ;  Surgeon: Osborne Oman, MD;  Location: [facility];  Service: Obstetrics;  Laterality: Bilateral;   CESAREAN SECTION WITH BILATERAL TUBAL LIGATION Bilateral 09/05/2016   Procedure: REPEAT CESAREAN SECTION WITH BILATERAL TUBAL LIGATION;  Surgeon: Aletha Halim, MD;  Location: [facility];  Service: Obstetrics;  Laterality: Bilateral;    CHOLECYSTECTOMY     COLONOSCOPY  04/16/2008   WISDOM TOOTH EXTRACTION      OB History     Gravida  5   Para  4   Term  4   Preterm      AB  1   Living  3      SAB  1   IAB      Ectopic      Multiple  0   Live Births  3            Home Medications    Prior to Admission medications   Medication Sig Start Date End Date Taking? Authorizing Provider  albuterol (VENTOLIN HFA) 108 (90 Base) MCG/ACT inhaler Inhale 2 puffs into the lungs every 6 (six) hours as needed for wheezing or shortness of breath. 01/25/23   Nyoka Lint, PA-C  HYDROcodone bit-homatropine (HYCODAN) 5-1.5 MG/5ML syrup Take 5 mLs by mouth every 6 (six) hours as needed for cough. 01/25/23   Nyoka Lint, PA-C  hydrOXYzine (ATARAX) 25 MG tablet Take 1 tablet (25 mg total) by mouth every 6 (six) hours as needed for anxiety. 12/13/21   Dorna Mai, MD  nitrofurantoin, macrocrystal-monohydrate, (MACROBID) 100 MG capsule Take 1 capsule (100 mg total) by mouth 2 (two) times daily. 11/07/22   Dorna Mai, MD  omeprazole (PRILOSEC) 40 MG capsule Take 1 capsule (40 mg total) by mouth daily. 10/25/22   Sharyn Creamer, MD  ondansetron (ZOFRAN-ODT) 4 MG disintegrating tablet Take 1 tablet (4 mg total) by mouth every 8 (eight) hours as needed for nausea or vomiting. 10/10/22   Camillia Herter, NP  oseltamivir (TAMIFLU) 75 MG capsule Take 1 capsule (75 mg total) by mouth every 12 (twelve) hours. 01/25/23   Nyoka Lint, PA-C  Spacer/Aero-Holding Chambers DEVI 1 Units by Does not apply route in the morning, at noon, and at bedtime. 01/25/23   Nyoka Lint, PA-C    Family History Family History  Problem Relation Age of Onset   Depression Mother    Fibromyalgia Mother    Cancer Mother        breast   Ovarian cancer Mother    Diabetes Maternal Grandmother    Hypertension Maternal Grandmother    Arthritis Maternal Grandmother    Thyroid disease Maternal Grandmother    Colon polyps Maternal Grandmother     Irritable bowel syndrome Maternal Grandmother     Social History Social History   Tobacco Use   Smoking status: Every Day    Packs/day: 0.50    Types: Cigarettes    Passive exposure: Current   Smokeless tobacco: Former    Quit date: 08/24/2014  Vaping Use   Vaping Use: Never used  Substance Use Topics   Alcohol use: No   Drug use: Yes    Types: Marijuana     Allergies   Patient has no known allergies.   Review of Systems Review of Systems  Skin:  Positive for wound (left upper posterior thigh rash).  Neurological:  Positive for weakness.     Physical  Exam Triage Vital Signs ED Triage Vitals  Enc Vitals Group     BP 03/13/23 0823 119/74     Pulse Rate 03/13/23 0823 90     Resp 03/13/23 0823 18     Temp 03/13/23 0823 98.4 F (36.9 C)     Temp Source 03/13/23 0823 Oral     SpO2 03/13/23 0823 98 %     Weight --      Height --      Head Circumference --      Peak Flow --      Pain Score 03/13/23 0824 0     Pain Loc --      Pain Edu? --      Excl. in [place]? --    No data found.  Updated Vital Signs BP 119/74 (BP Location: Left Arm)   Pulse 90   Temp 98.4 F (36.9 C) (Oral)   Resp 18   SpO2 98%   Breastfeeding No   Visual Acuity Right Eye Distance:   Left Eye Distance:   Bilateral Distance:    Right Eye Near:   Left Eye Near:    Bilateral Near:    Left posterior thigh:     Physical Exam Constitutional:      Appearance: Normal appearance.  HENT:     Mouth/Throat:     Mouth: Mucous membranes are moist.     Pharynx: Oropharynx is clear. Uvula midline.  Neck:     Thyroid: No thyromegaly.     Vascular: No carotid bruit.  Cardiovascular:     Rate and Rhythm: Normal rate and regular rhythm.     Heart sounds: Normal heart sounds.  Pulmonary:     Effort: Pulmonary effort is normal.     Breath sounds: Normal breath sounds and air entry. No wheezing, rhonchi or rales.  Abdominal:     General: Abdomen is flat. Bowel sounds are normal.      Palpations: Abdomen is soft.     Tenderness: There is no abdominal tenderness.  Musculoskeletal:       Legs:     Comments: Extremities: Strength is normal upper and lower extremities without weakness present.   Left lower extremity: There is no unusual redness or swelling, upper left posterior thigh with 7 x 6 cm area of molting skin, no unusual swelling.  Femoral, popliteal, dorsalis pedis posterior tibial pulses are normal.  Lymphadenopathy:     Cervical: No cervical adenopathy.  Neurological:     Mental Status: She is alert.      UC Treatments / Results  Labs (all labs ordered are listed, but only abnormal results are displayed) Labs Reviewed - No data to display  EKG   Radiology No results found.  Procedures Procedures (including critical care time)  Medications Ordered in UC Medications - No data to display  Initial Impression / Assessment and Plan / UC Course  I have reviewed the triage vital signs and the nursing notes.  Pertinent labs & imaging results that were available during my care of the patient were reviewed by me and considered in my medical decision making (see chart for details).    Plan: The diagnosis of be treated with the following: 1.  Left lower leg weakness: A.  CBC, CMP, TSH, and sed rate labs are pending. 2.  Multiple skin left lower extremity posterior thigh: A.  CBC, CMP, TSH, and sed rate labs are pending. B.  Advised to avoid nicotine patch. C.  Consider drawing for  vitamin B12 level, vitamin D, etc. 3.  Patient is advised to follow-up PCP for further evaluation of multiple skin and lower extremity weakness. Final Clinical Impressions(s) / UC Diagnoses   Final diagnoses:  Left leg weakness  Mottled skin     Discharge Instructions      Lab results were completed in 48 hours.  If you do not get a call from this office that indicates the labs are negative.  Log onto MyChart to be the test results with the post in 48  hours.  Advised not to use the nicotine patch any longer as this may be a cause of the multiple skin.  Advised to follow-up with PCP if the molting continues and is followed with weakness, numbness or tingling of the upper or lower extremities.  Or return to urgent care if symptoms fail to improve.    ED Prescriptions   None    PDMP not reviewed this encounter.   Nyoka Lint, PA-C 03/13/23 0916    Nyoka Lint, PA-C 03/13/23 [phone]

## 2023-03-14 LAB — COMPREHENSIVE METABOLIC PANEL
ALT: 7 IU/L (ref 0–32)
AST: 15 IU/L (ref 0–40)
Albumin/Globulin Ratio: 1.9 (ref 1.2–2.2)
Albumin: 4.3 g/dL (ref 3.9–4.9)
Alkaline Phosphatase: 89 IU/L (ref 44–121)
BUN/Creatinine Ratio: 10 (ref 9–23)
BUN: 7 mg/dL (ref 6–20)
Bilirubin Total: <0.2 mg/dL (ref 0.0–1.2)
CO2: 21 mmol/L (ref 20–29)
Calcium: 9.1 mg/dL (ref 8.7–10.2)
Chloride: 105 mmol/L (ref 96–106)
Creatinine, Ser: 0.72 mg/dL (ref 0.57–1.00)
Globulin, Total: 2.3 g/dL (ref 1.5–4.5)
Glucose: 93 mg/dL (ref 70–99)
Potassium: 4 mmol/L (ref 3.5–5.2)
Sodium: 141 mmol/L (ref 134–144)
Total Protein: 6.6 g/dL (ref 6.0–8.5)
eGFR: 112 mL/min/{1.73_m2} (ref >59)

## 2023-03-14 LAB — TSH: TSH: 1.12 u[IU]/mL (ref 0.450–4.500)

## 2023-03-14 LAB — SEDIMENTATION RATE: Sed Rate: 2 mm/hr (ref 0–32)

## 2023-03-14 LAB — CBC
Hematocrit: 33.9 % — ABNORMAL LOW (ref 34.0–46.6)
Hemoglobin: 11 g/dL — ABNORMAL LOW (ref 11.1–15.9)
MCH: 27.9 pg (ref 26.6–33.0)
MCHC: 32.4 g/dL (ref 31.5–35.7)
MCV: 86 fL (ref 79–97)
Platelets: 269 10*3/uL (ref 150–450)
RBC: 3.94 x10E6/uL (ref 3.77–5.28)
RDW: 13.6 % (ref 11.7–15.4)
WBC: 6.1 10*3/uL (ref 3.4–10.8)

## 2023-03-19 NOTE — Progress Notes (Unsigned)
Patient ID: Kim [name], female    DOB: September 02, 1990  MRN: [id number]  CC: Urgent Care Follow-Up  Subjective: Kim [name] is a 33 y.o. female who presents for urgent care follow-up.   Her concerns today include:  03/13/2023 [facility]) per PA note: Plan: The diagnosis of be treated with the following: 1.  Left lower leg weakness: A.  CBC, CMP, TSH, and sed rate labs are pending. 2.  Multiple skin left lower extremity posterior thigh: A.  CBC, CMP, TSH, and sed rate labs are pending. B.  Advised to avoid nicotine patch. C.  Consider drawing for vitamin B12 level, vitamin D, etc. 3.  Patient is advised to follow-up PCP for further evaluation of multiple skin and lower extremity weakness.  03/14/2023 per lab note - unremarkable   Today's visit 03/21/2023: - Intermittent left leg numbness persisting and unrelated to any particular activity. She denies red flag symptoms.  - Skin of left posterior upper left remaining the same with no additional symptoms.  - Reports she feels tired.  - No further issues/concerns for discussion today.   Patient Active Problem List   Diagnosis Date Noted   Biliary colic 0000000   Dyspareunia in female 04/24/2022   Abnormal uterine bleeding 04/24/2022   Anxiety and depression 04/24/2022   Psychophysiological insomnia 04/24/2022   MDD (major depressive disorder), recurrent severe, without psychosis ([place]) 07/08/2018   Suicide attempt ([place]) 07/08/2018   Asthma, mild 07/07/2018   Cannabis abuse 07/07/2018   Ileus, postoperative ([place]) 09/10/2016   Status post tubal ligation 09/10/2016   Status post repeat low transverse cesarean section 09/05/2016   Anemia of mother in pregnancy, antepartum 06/29/2016   GBS bacteriuria 04/06/2016   Asthma affecting pregnancy, antepartum 02/08/2016   Short interval between pregnancies affecting pregnancy, antepartum 02/08/2016   Previous cesarean section complicating  pregnancy, antepartum condition or complication [id number]   H/O pre-eclampsia in prior pregnancy, currently pregnant 12/14/2014   Asthma, moderate persistent, well-controlled 12/14/2014   Tobacco smoking affecting pregnancy, antepartum 12/14/2014   Bipolar depression ([place]) 12/14/2014   Supervision of normal pregnancy 11/16/2014   History of diabetes as a child and gestational diabetes 11/16/2014   Underweight 11/16/2014     Current Outpatient Medications on File Prior to Visit  Medication Sig Dispense Refill   albuterol (VENTOLIN HFA) 108 (90 Base) MCG/ACT inhaler Inhale 2 puffs into the lungs every 6 (six) hours as needed for wheezing or shortness of breath. 8 g 0   HYDROcodone bit-homatropine (HYCODAN) 5-1.5 MG/5ML syrup Take 5 mLs by mouth every 6 (six) hours as needed for cough. 120 mL 0   hydrOXYzine (ATARAX) 25 MG tablet Take 1 tablet (25 mg total) by mouth every 6 (six) hours as needed for anxiety. 30 tablet 1   nitrofurantoin, macrocrystal-monohydrate, (MACROBID) 100 MG capsule Take 1 capsule (100 mg total) by mouth 2 (two) times daily. 10 capsule 0   omeprazole (PRILOSEC) 40 MG capsule Take 1 capsule (40 mg total) by mouth daily. 90 capsule 3   ondansetron (ZOFRAN-ODT) 4 MG disintegrating tablet Take 1 tablet (4 mg total) by mouth every 8 (eight) hours as needed for nausea or vomiting. 30 tablet 1   oseltamivir (TAMIFLU) 75 MG capsule Take 1 capsule (75 mg total) by mouth every 12 (twelve) hours. 10 capsule 0   Spacer/Aero-Holding Chambers DEVI 1 Units by Does not apply route in the morning, at noon, and at bedtime. 1 Units 0   No  current facility-administered medications on file prior to visit.    No Known Allergies  Social History   Socioeconomic History   Marital status: Single    Spouse name: Not on file   Number of children: Not on file   Years of education: Not on file   Highest education level: Not on file  Occupational History   Not on file  Tobacco Use    Smoking status: Every Day    Packs/day: .5    Types: Cigarettes    Passive exposure: Current   Smokeless tobacco: Former    Quit date: 08/24/2014  Vaping Use   Vaping Use: Never used  Substance and Sexual Activity   Alcohol use: No   Drug use: Yes    Types: Marijuana   Sexual activity: Yes    Birth control/protection: None  Other Topics Concern   Not on file  Social History Narrative   Not on file   Social Determinants of Health   Financial Resource Strain: Not on file  Food Insecurity: Not on file  Transportation Needs: Not on file  Physical Activity: Not on file  Stress: Not on file  Social Connections: Not on file  Intimate Partner Violence: Not on file    Family History  Problem Relation Age of Onset   Depression Mother    Fibromyalgia Mother    Cancer Mother        breast   Ovarian cancer Mother    Diabetes Maternal Grandmother    Hypertension Maternal Grandmother    Arthritis Maternal Grandmother    Thyroid disease Maternal Grandmother    Colon polyps Maternal Grandmother    Irritable bowel syndrome Maternal Grandmother     Past Surgical History:  Procedure Laterality Date   CESAREAN SECTION     x 2   CESAREAN SECTION WITH BILATERAL TUBAL LIGATION Bilateral 07/03/2015   Procedure: REPEAT CESAREAN SECTION ;  Surgeon: Osborne Oman, MD;  Location: [facility];  Service: Obstetrics;  Laterality: Bilateral;   CESAREAN SECTION WITH BILATERAL TUBAL LIGATION Bilateral 09/05/2016   Procedure: REPEAT CESAREAN SECTION WITH BILATERAL TUBAL LIGATION;  Surgeon: Aletha Halim, MD;  Location: [place];  Service: Obstetrics;  Laterality: Bilateral;   CHOLECYSTECTOMY     COLONOSCOPY  04/16/2008   WISDOM TOOTH EXTRACTION      ROS: Review of Systems Negative except as stated above  PHYSICAL EXAM: BP 110/69 (BP Location: Left Arm, Patient Position: Sitting, Cuff Size: Small)   Pulse 67   Temp 98.3 F (36.8 C)   Resp 16   Wt 110 lb (49.9 kg)   SpO2 98%    BMI 16.73 kg/m   Physical Exam HENT:     Head: Normocephalic and atraumatic.  Eyes:     Extraocular Movements: Extraocular movements intact.     Conjunctiva/sclera: Conjunctivae normal.     Pupils: Pupils are equal, round, and reactive to light.  Cardiovascular:     Rate and Rhythm: Normal rate and regular rhythm.     Pulses: Normal pulses.     Heart sounds: Normal heart sounds.  Pulmonary:     Effort: Pulmonary effort is normal.     Breath sounds: Normal breath sounds.  Musculoskeletal:     Cervical back: Normal range of motion and neck supple.     Right hip: Normal.     Left hip: Normal.     Right upper leg: Normal.     Left upper leg: Normal.     Right knee:  Normal.     Left knee: Normal.     Right lower leg: Normal.     Left lower leg: Normal.     Right ankle: Normal.     Left ankle: Normal.     Right foot: Normal.     Left foot: Normal.  Skin:    General: Skin is warm and dry.     Comments: Left posterior upper leg skin mottled with no additional presentation.   Neurological:     General: No focal deficit present.     Mental Status: She is alert and oriented to person, place, and time.  Psychiatric:        Mood and Affect: Mood normal.        Behavior: Behavior normal.     ASSESSMENT AND PLAN: 1. Left leg weakness - Duloxetine as prescribed. Counseled on medication adherence/adverse effects.  - Referral to Neurology for further evaluation/management.  - Follow-up with primary provider as scheduled.  - DULoxetine (CYMBALTA) 20 MG capsule; Take 1 capsule (20 mg total) by mouth at bedtime.  Dispense: 30 capsule; Refill: 0 - Ambulatory referral to Neurology  2. Mottled skin - Referral to Dermatology for further evaluation/management.  - Ambulatory referral to Dermatology  3. Encounter for vitamin deficiency screening - Routine screening.  - Vitamin D, 25-hydroxy - Vitamin B12   Patient was given the opportunity to ask questions.  Patient verbalized  understanding of the plan and was able to repeat key elements of the plan. Patient was given clear instructions to go to Emergency Department or return to medical center if symptoms don't improve, worsen, or new problems develop.The patient verbalized understanding.   Orders Placed This Encounter  Procedures   Vitamin D, 25-hydroxy   Vitamin B12   Ambulatory referral to Dermatology   Ambulatory referral to Neurology     Requested Prescriptions   Signed Prescriptions Disp Refills   DULoxetine (CYMBALTA) 20 MG capsule 30 capsule 0    Sig: Take 1 capsule (20 mg total) by mouth at bedtime.    Return in 4 weeks (on 04/18/2023) for Follow-Up or next available with Dorna Mai, MD .  Camillia Herter, NP

## 2023-03-21 ENCOUNTER — Ambulatory Visit (INDEPENDENT_AMBULATORY_CARE_PROVIDER_SITE_OTHER): Payer: Medicaid Other | Admitting: Family

## 2023-03-21 ENCOUNTER — Encounter: Payer: Self-pay | Admitting: Family

## 2023-03-21 VITALS — BP 110/69 | HR 67 | Temp 98.3°F | Resp 16 | Wt 110.0 lb

## 2023-03-21 DIAGNOSIS — Z1321 Encounter for screening for nutritional disorder: Secondary | ICD-10-CM

## 2023-03-21 DIAGNOSIS — L819 Disorder of pigmentation, unspecified: Secondary | ICD-10-CM

## 2023-03-21 DIAGNOSIS — R29898 Other symptoms and signs involving the musculoskeletal system: Secondary | ICD-10-CM | POA: Diagnosis not present

## 2023-03-21 MED ORDER — DULOXETINE HCL 20 MG PO CPEP: 20.0000 mg | ORAL_CAPSULE | Freq: Every day | ORAL | 0 refills | Status: DC

## 2023-03-21 NOTE — Progress Notes (Signed)
Pt presents for left leg weakness -symptoms include tingling, numbness,  -denies any pain in the area

## 2023-03-23 LAB — VITAMIN B12: Vitamin B-12: 399 pg/mL (ref 232–1245)

## 2023-03-23 LAB — VITAMIN D 25 HYDROXY (VIT D DEFICIENCY, FRACTURES): Vit D, 25-Hydroxy: 12.1 ng/mL — ABNORMAL LOW (ref 30.0–100.0)

## 2023-03-25 ENCOUNTER — Other Ambulatory Visit: Payer: Self-pay | Admitting: Family

## 2023-03-25 DIAGNOSIS — E559 Vitamin D deficiency, unspecified: Secondary | ICD-10-CM | POA: Insufficient documentation

## 2023-03-25 MED ORDER — VITAMIN D (ERGOCALCIFEROL) 1.25 MG (50000 UNIT) PO CAPS: 50000.0000 [IU] | ORAL_CAPSULE | ORAL | 2 refills | Status: AC

## 2023-05-28 ENCOUNTER — Ambulatory Visit: Payer: Medicaid Other | Admitting: Diagnostic Neuroimaging

## 2023-05-28 ENCOUNTER — Encounter: Payer: Self-pay | Admitting: Diagnostic Neuroimaging

## 2023-09-17 ENCOUNTER — Ambulatory Visit: Payer: Medicaid Other | Admitting: Dermatology

## 2023-10-22 ENCOUNTER — Inpatient Hospital Stay (HOSPITAL_COMMUNITY): Payer: Medicaid Other | Admitting: Registered Nurse

## 2023-10-22 ENCOUNTER — Encounter (HOSPITAL_COMMUNITY): Admission: EM | Disposition: A | Discharge status: Home / Self Care | Payer: Self-pay | Source: Home / Self Care

## 2023-10-22 ENCOUNTER — Other Ambulatory Visit: Payer: Self-pay

## 2023-10-22 ENCOUNTER — Inpatient Hospital Stay (HOSPITAL_COMMUNITY)
Admission: EM | Admit: 2023-10-22 | Discharge: 2023-10-23 | DRG: 399 | Disposition: A | Discharge status: Home / Self Care | Payer: Medicaid Other | Attending: General Surgery | Admitting: General Surgery

## 2023-10-22 ENCOUNTER — Ambulatory Visit
Admission: EM | Admit: 2023-10-22 | Discharge: 2023-10-22 | Disposition: A | Discharge status: Other Acute Inpatient Hospital | Payer: Medicaid Other

## 2023-10-22 ENCOUNTER — Emergency Department (HOSPITAL_COMMUNITY): Payer: Medicaid Other

## 2023-10-22 ENCOUNTER — Encounter (HOSPITAL_COMMUNITY): Payer: Self-pay

## 2023-10-22 DIAGNOSIS — J45909 Unspecified asthma, uncomplicated: Secondary | ICD-10-CM | POA: Diagnosis present

## 2023-10-22 DIAGNOSIS — R109 Unspecified abdominal pain: Secondary | ICD-10-CM | POA: Diagnosis present

## 2023-10-22 DIAGNOSIS — K3580 Unspecified acute appendicitis: Principal | ICD-10-CM | POA: Diagnosis present

## 2023-10-22 DIAGNOSIS — M797 Fibromyalgia: Secondary | ICD-10-CM | POA: Diagnosis present

## 2023-10-22 DIAGNOSIS — Z833 Family history of diabetes mellitus: Secondary | ICD-10-CM | POA: Diagnosis not present

## 2023-10-22 DIAGNOSIS — R001 Bradycardia, unspecified: Secondary | ICD-10-CM | POA: Diagnosis not present

## 2023-10-22 DIAGNOSIS — K37 Unspecified appendicitis: Secondary | ICD-10-CM | POA: Diagnosis not present

## 2023-10-22 DIAGNOSIS — K660 Peritoneal adhesions (postprocedural) (postinfection): Secondary | ICD-10-CM | POA: Diagnosis present

## 2023-10-22 DIAGNOSIS — Z8632 Personal history of gestational diabetes: Secondary | ICD-10-CM | POA: Diagnosis not present

## 2023-10-22 DIAGNOSIS — Z98891 History of uterine scar from previous surgery: Secondary | ICD-10-CM | POA: Diagnosis not present

## 2023-10-22 DIAGNOSIS — R1033 Periumbilical pain: Secondary | ICD-10-CM

## 2023-10-22 DIAGNOSIS — Z8041 Family history of malignant neoplasm of ovary: Secondary | ICD-10-CM

## 2023-10-22 DIAGNOSIS — Z818 Family history of other mental and behavioral disorders: Secondary | ICD-10-CM

## 2023-10-22 DIAGNOSIS — F17210 Nicotine dependence, cigarettes, uncomplicated: Secondary | ICD-10-CM | POA: Diagnosis present

## 2023-10-22 DIAGNOSIS — K3533 Acute appendicitis with perforation and localized peritonitis, with abscess: Principal | ICD-10-CM | POA: Diagnosis present

## 2023-10-22 DIAGNOSIS — Z9049 Acquired absence of other specified parts of digestive tract: Secondary | ICD-10-CM

## 2023-10-22 DIAGNOSIS — Z8261 Family history of arthritis: Secondary | ICD-10-CM | POA: Diagnosis not present

## 2023-10-22 DIAGNOSIS — Z83719 Family history of colon polyps, unspecified: Secondary | ICD-10-CM

## 2023-10-22 DIAGNOSIS — Z8249 Family history of ischemic heart disease and other diseases of the circulatory system: Secondary | ICD-10-CM

## 2023-10-22 HISTORY — PX: LAPAROSCOPIC APPENDECTOMY: SHX408

## 2023-10-22 HISTORY — DX: Unspecified acute appendicitis: K35.80

## 2023-10-22 LAB — CBC
HCT: 33.4 % — ABNORMAL LOW (ref 36.0–46.0)
Hemoglobin: 10.7 g/dL — ABNORMAL LOW (ref 12.0–15.0)
MCH: 28 pg (ref 26.0–34.0)
MCHC: 32 g/dL (ref 30.0–36.0)
MCV: 87.4 fL (ref 80.0–100.0)
Platelets: 237 10*3/uL (ref 150–400)
RBC: 3.82 MIL/uL — ABNORMAL LOW (ref 3.87–5.11)
RDW: 14.6 % (ref 11.5–15.5)
WBC: 10 10*3/uL (ref 4.0–10.5)
nRBC: 0 % (ref 0.0–0.2)

## 2023-10-22 LAB — URINALYSIS, ROUTINE W REFLEX MICROSCOPIC
Bilirubin Urine: NEGATIVE
Glucose, UA: NEGATIVE mg/dL
Ketones, ur: NEGATIVE mg/dL
Leukocytes,Ua: NEGATIVE
Nitrite: POSITIVE — AB
Protein, ur: NEGATIVE mg/dL
Specific Gravity, Urine: 1.016 (ref 1.005–1.030)
pH: 6 (ref 5.0–8.0)

## 2023-10-22 LAB — COMPREHENSIVE METABOLIC PANEL
ALT: 11 U/L (ref 0–44)
AST: 16 U/L (ref 15–41)
Albumin: 3.9 g/dL (ref 3.5–5.0)
Alkaline Phosphatase: 59 U/L (ref 38–126)
Anion gap: 6 (ref 5–15)
BUN: 9 mg/dL (ref 6–20)
CO2: 25 mmol/L (ref 22–32)
Calcium: 8.6 mg/dL — ABNORMAL LOW (ref 8.9–10.3)
Chloride: 106 mmol/L (ref 98–111)
Creatinine, Ser: 0.7 mg/dL (ref 0.44–1.00)
GFR, Estimated: >60 mL/min (ref >60)
Glucose, Bld: 123 mg/dL — ABNORMAL HIGH (ref 70–99)
Potassium: 3.3 mmol/L — ABNORMAL LOW (ref 3.5–5.1)
Sodium: 137 mmol/L (ref 135–145)
Total Bilirubin: 0.6 mg/dL (ref 0.3–1.2)
Total Protein: 6.5 g/dL (ref 6.5–8.1)

## 2023-10-22 LAB — HCG, SERUM, QUALITATIVE: Preg, Serum: NEGATIVE

## 2023-10-22 LAB — LIPASE, BLOOD: Lipase: 28 U/L (ref 11–51)

## 2023-10-22 SURGERY — APPENDECTOMY, LAPAROSCOPIC
Anesthesia: General | Site: Abdomen

## 2023-10-22 MED ORDER — PROPOFOL 10 MG/ML IV BOLUS
INTRAVENOUS | Status: AC
  Filled 2023-10-22: qty 20

## 2023-10-22 MED ORDER — HYDROMORPHONE HCL 1 MG/ML IJ SOLN
0.5000 mg | INTRAMUSCULAR | Status: DC | PRN
  Administered 2023-10-23: 0.5 mg via INTRAVENOUS
  Filled 2023-10-22: qty 0.5

## 2023-10-22 MED ORDER — KETOROLAC TROMETHAMINE 15 MG/ML IJ SOLN
15.0000 mg | Freq: Three times a day (TID) | INTRAMUSCULAR | Status: DC
  Administered 2023-10-23: 15 mg via INTRAVENOUS
  Filled 2023-10-22: qty 1

## 2023-10-22 MED ORDER — FENTANYL CITRATE (PF) 250 MCG/5ML IJ SOLN
INTRAMUSCULAR | Status: AC
  Filled 2023-10-22: qty 5

## 2023-10-22 MED ORDER — 0.9 % SODIUM CHLORIDE (POUR BTL) OPTIME
TOPICAL | Status: DC | PRN
  Administered 2023-10-22: 1000 mL

## 2023-10-22 MED ORDER — ACETAMINOPHEN 325 MG PO TABS
650.0000 mg | ORAL_TABLET | Freq: Four times a day (QID) | ORAL | Status: DC
  Administered 2023-10-23 (×2): 650 mg via ORAL
  Filled 2023-10-22 (×2): qty 2

## 2023-10-22 MED ORDER — LACTATED RINGERS IV SOLN: INTRAVENOUS | Status: DC

## 2023-10-22 MED ORDER — SIMETHICONE 80 MG PO CHEW: 80.0000 mg | CHEWABLE_TABLET | Freq: Four times a day (QID) | ORAL | Status: DC | PRN

## 2023-10-22 MED ORDER — ENOXAPARIN SODIUM 40 MG/0.4ML IJ SOSY: 40.0000 mg | PREFILLED_SYRINGE | INTRAMUSCULAR | Status: DC

## 2023-10-22 MED ORDER — SUCCINYLCHOLINE CHLORIDE 200 MG/10ML IV SOSY
PREFILLED_SYRINGE | INTRAVENOUS | Status: AC
  Filled 2023-10-22: qty 10

## 2023-10-22 MED ORDER — OXYCODONE HCL 5 MG PO TABS
5.0000 mg | ORAL_TABLET | ORAL | Status: DC | PRN
  Administered 2023-10-23: 5 mg via ORAL

## 2023-10-22 MED ORDER — DEXAMETHASONE SODIUM PHOSPHATE 10 MG/ML IJ SOLN
INTRAMUSCULAR | Status: AC
  Filled 2023-10-22: qty 1

## 2023-10-22 MED ORDER — MIDAZOLAM HCL 2 MG/2ML IJ SOLN
INTRAMUSCULAR | Status: AC
  Filled 2023-10-22: qty 2

## 2023-10-22 MED ORDER — SODIUM CHLORIDE 0.9 % IR SOLN
Status: DC | PRN
  Administered 2023-10-22: 1000 mL

## 2023-10-22 MED ORDER — BUPIVACAINE-EPINEPHRINE (PF) 0.25% -1:200000 IJ SOLN
INTRAMUSCULAR | Status: AC
Start: 2023-10-22 — End: ?
  Filled 2023-10-22: qty 30

## 2023-10-22 MED ORDER — OXYCODONE HCL 5 MG PO TABS
10.0000 mg | ORAL_TABLET | ORAL | Status: DC | PRN
  Filled 2023-10-22: qty 2

## 2023-10-22 MED ORDER — LIDOCAINE 2% (20 MG/ML) 5 ML SYRINGE
INTRAMUSCULAR | Status: AC
Start: 2023-10-22 — End: ?
  Filled 2023-10-22: qty 5

## 2023-10-22 MED ORDER — PROPOFOL 10 MG/ML IV BOLUS
INTRAVENOUS | Status: DC | PRN
  Administered 2023-10-22: 130 mg via INTRAVENOUS

## 2023-10-22 MED ORDER — ROCURONIUM BROMIDE 10 MG/ML (PF) SYRINGE
PREFILLED_SYRINGE | INTRAVENOUS | Status: AC
  Filled 2023-10-22: qty 10

## 2023-10-22 MED ORDER — SUGAMMADEX SODIUM 200 MG/2ML IV SOLN
INTRAVENOUS | Status: DC | PRN
  Administered 2023-10-22: 100 mg via INTRAVENOUS

## 2023-10-22 MED ORDER — KETOROLAC TROMETHAMINE 30 MG/ML IJ SOLN
INTRAMUSCULAR | Status: AC
  Filled 2023-10-22: qty 1

## 2023-10-22 MED ORDER — EPHEDRINE 5 MG/ML INJ
INTRAVENOUS | Status: AC
  Filled 2023-10-22: qty 5

## 2023-10-22 MED ORDER — LIDOCAINE 2% (20 MG/ML) 5 ML SYRINGE
INTRAMUSCULAR | Status: AC
  Filled 2023-10-22: qty 5

## 2023-10-22 MED ORDER — BUPIVACAINE-EPINEPHRINE 0.25% -1:200000 IJ SOLN
INTRAMUSCULAR | Status: DC | PRN
  Administered 2023-10-22: 30 mL

## 2023-10-22 MED ORDER — STERILE WATER FOR IRRIGATION IR SOLN
Status: DC | PRN
  Administered 2023-10-22: 200 mL

## 2023-10-22 MED ORDER — MIDAZOLAM HCL 2 MG/2ML IJ SOLN
INTRAMUSCULAR | Status: DC | PRN
  Administered 2023-10-22: 2 mg via INTRAVENOUS

## 2023-10-22 MED ORDER — ONDANSETRON 4 MG PO TBDP
4.0000 mg | ORAL_TABLET | Freq: Once | ORAL | Status: AC | PRN
  Administered 2023-10-22: 4 mg via ORAL
  Filled 2023-10-22: qty 1

## 2023-10-22 MED ORDER — SODIUM CHLORIDE 0.9 % IV SOLN
2.0000 g | INTRAVENOUS | Status: DC
  Administered 2023-10-22: 2 g via INTRAVENOUS
  Filled 2023-10-22: qty 20

## 2023-10-22 MED ORDER — GABAPENTIN 300 MG PO CAPS
300.0000 mg | ORAL_CAPSULE | Freq: Three times a day (TID) | ORAL | Status: DC
Start: 2023-10-22 — End: 2023-10-23
  Administered 2023-10-23: 300 mg via ORAL
  Filled 2023-10-22: qty 1

## 2023-10-22 MED ORDER — PROCHLORPERAZINE EDISYLATE 10 MG/2ML IJ SOLN: 10.0000 mg | INTRAMUSCULAR | Status: DC | PRN

## 2023-10-22 MED ORDER — PHENYLEPHRINE 80 MCG/ML (10ML) SYRINGE FOR IV PUSH (FOR BLOOD PRESSURE SUPPORT)
PREFILLED_SYRINGE | INTRAVENOUS | Status: AC
  Filled 2023-10-22: qty 50

## 2023-10-22 MED ORDER — METRONIDAZOLE 500 MG/100ML IV SOLN
500.0000 mg | Freq: Two times a day (BID) | INTRAVENOUS | Status: DC
  Administered 2023-10-22 – 2023-10-23 (×2): 500 mg via INTRAVENOUS
  Filled 2023-10-22 (×2): qty 100

## 2023-10-22 MED ORDER — ONDANSETRON HCL 4 MG/2ML IJ SOLN: 4.0000 mg | Freq: Four times a day (QID) | INTRAMUSCULAR | Status: DC | PRN

## 2023-10-22 MED ORDER — FENTANYL CITRATE (PF) 250 MCG/5ML IJ SOLN
INTRAMUSCULAR | Status: DC | PRN
  Administered 2023-10-22 (×2): 75 ug via INTRAVENOUS

## 2023-10-22 MED ORDER — LIDOCAINE 2% (20 MG/ML) 5 ML SYRINGE
INTRAMUSCULAR | Status: DC | PRN
  Administered 2023-10-22: 60 mg via INTRAVENOUS

## 2023-10-22 MED ORDER — IOHEXOL 350 MG/ML SOLN
75.0000 mL | Freq: Once | INTRAVENOUS | Status: AC | PRN
  Administered 2023-10-22: 75 mL via INTRAVENOUS

## 2023-10-22 MED ORDER — ROCURONIUM BROMIDE 10 MG/ML (PF) SYRINGE
PREFILLED_SYRINGE | INTRAVENOUS | Status: AC
Start: 2023-10-22 — End: ?
  Filled 2023-10-22: qty 10

## 2023-10-22 MED ORDER — DEXAMETHASONE SODIUM PHOSPHATE 10 MG/ML IJ SOLN
INTRAMUSCULAR | Status: DC | PRN
  Administered 2023-10-22: 5 mg via INTRAVENOUS

## 2023-10-22 MED ORDER — ROCURONIUM BROMIDE 10 MG/ML (PF) SYRINGE
PREFILLED_SYRINGE | INTRAVENOUS | Status: DC | PRN
  Administered 2023-10-22: 30 mg via INTRAVENOUS

## 2023-10-22 MED ORDER — SUCCINYLCHOLINE CHLORIDE 200 MG/10ML IV SOSY
PREFILLED_SYRINGE | INTRAVENOUS | Status: DC | PRN
  Administered 2023-10-22: 100 mg via INTRAVENOUS

## 2023-10-22 MED ORDER — METHOCARBAMOL 1000 MG/10ML IJ SOLN: 500.0000 mg | Freq: Four times a day (QID) | INTRAMUSCULAR | Status: DC | PRN

## 2023-10-22 MED ORDER — SODIUM CHLORIDE 0.9 % IV SOLN: INTRAVENOUS | Status: DC | PRN

## 2023-10-22 MED ORDER — KETOROLAC TROMETHAMINE 30 MG/ML IJ SOLN
INTRAMUSCULAR | Status: DC | PRN
  Administered 2023-10-22: 15 mg via INTRAVENOUS

## 2023-10-22 MED ORDER — DOCUSATE SODIUM 100 MG PO CAPS
100.0000 mg | ORAL_CAPSULE | Freq: Two times a day (BID) | ORAL | Status: DC
  Administered 2023-10-23: 100 mg via ORAL
  Filled 2023-10-22: qty 1

## 2023-10-22 MED ORDER — ONDANSETRON HCL 4 MG/2ML IJ SOLN
INTRAMUSCULAR | Status: AC
Start: 2023-10-22 — End: ?
  Filled 2023-10-22: qty 2

## 2023-10-22 MED ORDER — ONDANSETRON HCL 4 MG/2ML IJ SOLN
INTRAMUSCULAR | Status: DC | PRN
  Administered 2023-10-22: 4 mg via INTRAVENOUS

## 2023-10-22 SURGICAL SUPPLY — 53 items
ADH SKN CLS APL DERMABOND .7 (GAUZE/BANDAGES/DRESSINGS) ×1
APL PRP STRL LF DISP 70% ISPRP (MISCELLANEOUS) ×1
APPLIER CLIP 5 13 M/L LIGAMAX5 (MISCELLANEOUS)
APR CLP MED LRG 5 ANG JAW (MISCELLANEOUS)
BAG COUNTER SPONGE SURGICOUNT (BAG) ×1 IMPLANT
BAG SPNG CNTER NS LX DISP (BAG)
BLADE CLIPPER SURG (BLADE) IMPLANT
CANISTER SUCT 3000ML PPV (MISCELLANEOUS) ×1 IMPLANT
CHLORAPREP W/TINT 26 (MISCELLANEOUS) ×1 IMPLANT
CLIP APPLIE 5 13 M/L LIGAMAX5 (MISCELLANEOUS) IMPLANT
COVER SURGICAL LIGHT HANDLE (MISCELLANEOUS) ×1 IMPLANT
DERMABOND ADVANCED .7 DNX12 (GAUZE/BANDAGES/DRESSINGS) ×1 IMPLANT
ELECT REM PT RETURN 9FT ADLT (ELECTROSURGICAL)
ELECTRODE REM PT RTRN 9FT ADLT (ELECTROSURGICAL) ×1 IMPLANT
GLOVE BIO SURGEON STRL SZ7.5 (GLOVE) ×1 IMPLANT
GLOVE BIOGEL PI IND STRL 6.5 (GLOVE) IMPLANT
GLOVE BIOGEL PI IND STRL 8 (GLOVE) ×1 IMPLANT
GOWN STRL REUS W/ TWL LRG LVL3 (GOWN DISPOSABLE) ×2 IMPLANT
GOWN STRL REUS W/ TWL XL LVL3 (GOWN DISPOSABLE) ×1 IMPLANT
GOWN STRL REUS W/TWL LRG LVL3 (GOWN DISPOSABLE)
GOWN STRL REUS W/TWL XL LVL3 (GOWN DISPOSABLE) ×1
GOWN STRL SURGICAL XL XLNG (GOWN DISPOSABLE) IMPLANT
GRASPER SUT TROCAR 14GX15 (MISCELLANEOUS) ×1 IMPLANT
IRRIG SUCT STRYKERFLOW 2 WTIP (MISCELLANEOUS) ×1
IRRIGATION SUCT STRKRFLW 2 WTP (MISCELLANEOUS) ×1 IMPLANT
KIT BASIN OR (CUSTOM PROCEDURE TRAY) ×1 IMPLANT
KIT TURNOVER KIT B (KITS) ×1 IMPLANT
NDL 22X1.5 STRL (OR ONLY) (MISCELLANEOUS) ×1 IMPLANT
NDL INSUFFLATION 14GA 120MM (NEEDLE) ×1 IMPLANT
NEEDLE 22X1.5 STRL (OR ONLY) (MISCELLANEOUS) IMPLANT
NEEDLE INSUFFLATION 14GA 120MM (NEEDLE) ×1 IMPLANT
NS IRRIG 1000ML POUR BTL (IV SOLUTION) ×1 IMPLANT
PAD ARMBOARD 7.5X6 YLW CONV (MISCELLANEOUS) ×2 IMPLANT
RELOAD STAPLE 60 2.6 WHT THN (STAPLE) ×1 IMPLANT
RELOAD STAPLER WHITE 60MM (STAPLE) ×1 IMPLANT
SCISSORS LAP 5X35 DISP (ENDOMECHANICALS) IMPLANT
SET TUBE SMOKE EVAC HIGH FLOW (TUBING) ×1 IMPLANT
SHEARS HARMONIC ACE PLUS 36CM (ENDOMECHANICALS) ×1 IMPLANT
SLEEVE Z-THREAD 5X100MM (TROCAR) ×1 IMPLANT
SPECIMEN JAR SMALL (MISCELLANEOUS) ×1 IMPLANT
STAPLER POWER ECHELON 60 WIDE (STAPLE) ×1 IMPLANT
STAPLER RELOAD WHITE 60MM (STAPLE) ×1
SUT MNCRL AB 4-0 PS2 18 (SUTURE) ×1 IMPLANT
SYS BAG RETRIEVAL 10MM (BASKET) ×1
SYSTEM BAG RETRIEVAL 10MM (BASKET) ×1 IMPLANT
TOWEL GREEN STERILE FF (TOWEL DISPOSABLE) ×1 IMPLANT
TRAY FOLEY W/BAG SLVR 16FR (SET/KITS/TRAYS/PACK)
TRAY FOLEY W/BAG SLVR 16FR ST (SET/KITS/TRAYS/PACK) IMPLANT
TRAY LAPAROSCOPIC MC (CUSTOM PROCEDURE TRAY) ×1 IMPLANT
TROCAR Z THREAD OPTICAL 12X100 (TROCAR) ×1 IMPLANT
TROCAR Z-THREAD OPTICAL 5X100M (TROCAR) ×1 IMPLANT
WARMER LAPAROSCOPE (MISCELLANEOUS) ×1 IMPLANT
WATER STERILE IRR 1000ML POUR (IV SOLUTION) ×1 IMPLANT

## 2023-10-22 NOTE — Plan of Care (Signed)
  Problem: Education: Goal: Knowledge of General Education information will improve Description: Including pain rating scale, medication(s)/side effects and non-pharmacologic comfort measures Outcome: Progressing   Problem: Clinical Measurements: Goal: Will remain free from infection Outcome: Progressing   Problem: Activity: Goal: Risk for activity intolerance will decrease Outcome: Progressing   Problem: Nutrition: Goal: Adequate nutrition will be maintained Outcome: Progressing   Problem: Coping: Goal: Level of anxiety will decrease Outcome: Progressing   Problem: Elimination: Goal: Will not experience complications related to bowel motility Outcome: Progressing Goal: Will not experience complications related to urinary retention Outcome: Progressing   Problem: Pain Management: Goal: General experience of comfort will improve Outcome: Progressing   Problem: Safety: Goal: Ability to remain free from injury will improve Outcome: Progressing   Problem: Skin Integrity: Goal: Risk for impaired skin integrity will decrease Outcome: Progressing

## 2023-10-22 NOTE — ED Provider Notes (Signed)
Kim [name] EMERGENCY DEPARTMENT AT [facility] Provider Note   CSN: [id number] Arrival date & time: 10/22/23  1207     History  Chief Complaint  Patient presents with   Abdominal Pain   Emesis    LOCHLYN [name] is a 33 y.o. female.   Abdominal Pain Associated symptoms: vomiting   Emesis Associated symptoms: abdominal pain   Patient with abdominal pain.  Mid abdomen.  No fevers.  Some nausea.  Some vomiting.  No dysuria.  Denies vaginal bleeding or discharge.  States she used to have abdominal pain more frequently but stopped with her cholecystectomy.    Past Medical History:  Diagnosis Date   Anemia    Anxiety    Asthma    Chest pain    Depression    Fibromyalgia    Gestational diabetes 2010, 2012   Heartburn during pregnancy    Rectal bleeding    Seizures ([facility]) 2015   x 1- unknown reason- passed out   Ulcer     Home Medications Prior to Admission medications   Medication Sig Start Date End Date Taking? Authorizing Provider  albuterol (VENTOLIN HFA) 108 (90 Base) MCG/ACT inhaler Inhale 2 puffs into the lungs every 6 (six) hours as needed for wheezing or shortness of breath. 01/25/23   Ellsworth Lennox, PA-C  hydrOXYzine (ATARAX) 25 MG tablet Take 1 tablet (25 mg total) by mouth every 6 (six) hours as needed for anxiety. 12/13/21   Georganna Skeans, MD  ondansetron (ZOFRAN-ODT) 4 MG disintegrating tablet Take 1 tablet (4 mg total) by mouth every 8 (eight) hours as needed for nausea or vomiting. 10/10/22   Rema Fendt, NP  Spacer/Aero-Holding Deretha Emory DEVI 1 Units by Does not apply route in the morning, at noon, and at bedtime. 01/25/23   Ellsworth Lennox, PA-C      Allergies    Patient has no known allergies.    Review of Systems   Review of Systems  Gastrointestinal:  Positive for abdominal pain and vomiting.    Physical Exam Updated Vital Signs BP (!) 111/59 (BP Location: Left Arm)   Pulse (!) 53   Temp 98.1 F (36.7 C)   Resp 17   LMP 10/15/2023  (Exact Date)   SpO2 96%  Physical Exam Vitals and nursing note reviewed.  Cardiovascular:     Rate and Rhythm: Normal rate.  Pulmonary:     Breath sounds: Normal breath sounds.  Abdominal:     Tenderness: There is abdominal tenderness.     Hernia: No hernia is present.     Comments:  suprapubic tenderness.  No rebound or guarding.  No hernia palpated.  Skin:    General: Skin is warm.     Capillary Refill: Capillary refill takes less than 2 seconds.  Neurological:     Mental Status: She is alert.     ED Results / Procedures / Treatments   Labs (all labs ordered are listed, but only abnormal results are displayed) Labs Reviewed  COMPREHENSIVE METABOLIC PANEL - Abnormal; Notable for the following components:      Result Value   Potassium 3.3 (*)    Glucose, Bld 123 (*)    Calcium 8.6 (*)    All other components within normal limits  CBC - Abnormal; Notable for the following components:   RBC 3.82 (*)    Hemoglobin 10.7 (*)    HCT 33.4 (*)    All other components within normal limits  URINALYSIS, ROUTINE W REFLEX  MICROSCOPIC - Abnormal; Notable for the following components:   APPearance HAZY (*)    Hgb urine dipstick MODERATE (*)    Nitrite POSITIVE (*)    Bacteria, UA MANY (*)    All other components within normal limits  LIPASE, BLOOD  HCG, SERUM, QUALITATIVE  HIV ANTIBODY (ROUTINE TESTING W REFLEX)  CBC  CREATININE, SERUM  BASIC METABOLIC PANEL  CBC  SURGICAL PATHOLOGY    EKG None  Radiology CT ABDOMEN PELVIS W CONTRAST  Result Date: 10/22/2023 CLINICAL DATA:  Right lower quadrant abdominal pain. EXAM: CT ABDOMEN AND PELVIS WITH CONTRAST TECHNIQUE: Multidetector CT imaging of the abdomen and pelvis was performed using the standard protocol following bolus administration of intravenous contrast. RADIATION DOSE REDUCTION: This exam was performed according to the departmental dose-optimization program which includes automated exposure control, adjustment of the  mA and/or kV according to patient size and/or use of iterative reconstruction technique. CONTRAST:  75mL OMNIPAQUE IOHEXOL 350 MG/ML SOLN COMPARISON:  CT 10/04/2022 FINDINGS: Lower chest: Clear lung bases. Hepatobiliary: Small cyst in the left lobe of the liver. No suspicious liver lesion. Clips in the gallbladder fossa postcholecystectomy. No biliary dilatation. Pancreas: No ductal dilatation or inflammation. Spleen: Normal in size without focal abnormality. Adrenals/Urinary Tract: No adrenal nodule. Early excretion of IV contrast in the renal collecting systems. No hydronephrosis or renal inflammation. Urinary bladder is unremarkable. Stomach/Bowel: The midportion of the appendix is mildly dilated at 7 mm, series 6, image 47 and series 3, image 58. There may be mild appendiceal wall thickening and trace fluid in the mid appendix. Findings may represent early appendicitis. Occasional fluid-filled loops of small bowel in the pelvis. No obstruction. Unremarkable appearance of the stomach. Small volume of stool in the colon. Vascular/Lymphatic: No acute vascular findings. The abdominal aorta is normal in caliber. The portal vein is patent. There is prominent periuterine and adnexal vascularity with dilatation of the ovarian veins, 7 mm on the left and 6 mm on the right. No abdominopelvic adenopathy. Reproductive: Prominent periuterine and adnexal vascularity. Anteverted uterus. No obvious adnexal mass. Other: No free air or ascites. No abdominopelvic collection. No abdominal wall hernia. Musculoskeletal: Mild scoliosis. There are no acute or suspicious osseous abnormalities. IMPRESSION: 1. Mild dilatation of the mid appendix with possible mild wall thickening and trace intraluminal fluid. Findings may represent early appendicitis. 2. Prominent periuterine and adnexal vascularity with dilatation of the ovarian veins. This can be seen with pelvic congestion syndrome in the appropriate clinical setting. Electronically  Signed   By: Narda Rutherford M.D.   On: 10/22/2023 19:15    Procedures Procedures    Medications Ordered in ED Medications  enoxaparin (LOVENOX) injection 40 mg ( Subcutaneous Automatically Held 10/30/23 2015)  cefTRIAXone (ROCEPHIN) 2 g in sodium chloride 0.9 % 100 mL IVPB ( Intravenous Automatically Held 10/30/23 2015)    And  metroNIDAZOLE (FLAGYL) IVPB 500 mg ( Intravenous Automatically Held 10/29/23 0815)  acetaminophen (TYLENOL) tablet 650 mg ( Oral Automatically Held 10/30/23 2015)  ketorolac (TORADOL) 15 MG/ML injection 15 mg ( Intravenous Automatically Held 10/27/23 1400)  gabapentin (NEURONTIN) capsule 300 mg ( Oral Automatically Held 10/30/23 2200)  oxyCODONE (Oxy IR/ROXICODONE) immediate release tablet 5 mg ( Oral MAR Hold 10/22/23 2132)  oxyCODONE (Oxy IR/ROXICODONE) immediate release tablet 10 mg ( Oral MAR Hold 10/22/23 2132)  HYDROmorphone (DILAUDID) injection 0.5 mg ( Intravenous MAR Hold 10/22/23 2132)  methocarbamol (ROBAXIN) injection 500 mg ( Intravenous MAR Hold 10/22/23 2132)  ondansetron (ZOFRAN) injection 4 mg (  Intravenous MAR Hold 10/22/23 2132)  prochlorperazine (COMPAZINE) injection 10 mg ( Intravenous MAR Hold 10/22/23 2132)  simethicone (MYLICON) chewable tablet 80 mg ( Oral MAR Hold 10/22/23 2132)  docusate sodium (COLACE) capsule 100 mg ( Oral Automatically Held 10/30/23 2200)  ondansetron (ZOFRAN-ODT) disintegrating tablet 4 mg (4 mg Oral Given 10/22/23 1924)  iohexol (OMNIPAQUE) 350 MG/ML injection 75 mL (75 mLs Intravenous Contrast Given 10/22/23 1619)    ED Course/ Medical Decision Making/ A&P                                 Medical Decision Making Amount and/or Complexity of Data Reviewed Labs: ordered.  Risk Prescription drug management. Decision regarding hospitalization.   Patient with lower abdominal pain.  Start did periumbilical.  Denies dysuria.  Urinalysis does however have nitrites and many bacteria.  Differential diagnosis does  include UTI but also includes causes such as diverticulitis.  Denies vaginal bleeding or discharge.    White count reassuring.  Has had previous cholecystectomy.  CT scan done and showed likely appendicitis. I discussed with Dr. Dossie Der, who saw patient and will take 2 OR.        Final Clinical Impression(s) / ED Diagnoses Final diagnoses:  Acute appendicitis, unspecified acute appendicitis type    Rx / DC Orders ED Discharge Orders     None         Benjiman Core, MD 10/22/23 2311

## 2023-10-22 NOTE — ED Notes (Signed)
Patient is being discharged from the Urgent Care and sent to the Emergency Department via POV . Per RM, patient is in need of higher level of care due to abd pain. Patient is aware and verbalizes understanding of plan of care.  Vitals:   10/22/23 1123 10/22/23 1124  BP:  107/73  Pulse:  83  Temp: 98.3 F (36.8 C) 98.3 F (36.8 C)  SpO2:  97%

## 2023-10-22 NOTE — ED Triage Notes (Signed)
Patient presents with abdomen pain that started last night. Treated with Pepto. Vomited once and nausea.

## 2023-10-22 NOTE — H&P (Addendum)
Admitting Physician: Hyman Hopes Maddelyn Rocca  Service: General Surgery  CC: Abdominal pain  Subjective   HPI: Kim [name] is an 33 y.o. female who is here for abdominal pain.  The pain started around her umbilicus last night at 5 PM.  It now is located in the right lower quadrant.  It is associated with nausea and 1 episode of vomiting.  She is never had pain like this before.  She has no dysuria or urinary symptoms.  Past Medical History:  Diagnosis Date   Anemia    Anxiety    Asthma    Chest pain    Depression    Fibromyalgia    Gestational diabetes 2010, 2012   Heartburn during pregnancy    Rectal bleeding    Seizures (HCC) 2015   x 1- unknown reason- passed out   Ulcer     Past Surgical History:  Procedure Laterality Date   CESAREAN SECTION     x 2   CESAREAN SECTION WITH BILATERAL TUBAL LIGATION Bilateral 07/03/2015   Procedure: REPEAT CESAREAN SECTION ;  Surgeon: Tereso Newcomer, MD;  Location: [facility];  Service: Obstetrics;  Laterality: Bilateral;   CESAREAN SECTION WITH BILATERAL TUBAL LIGATION Bilateral 09/05/2016   Procedure: REPEAT CESAREAN SECTION WITH BILATERAL TUBAL LIGATION;  Surgeon: Cade Bing, MD;  Location: [facility];  Service: Obstetrics;  Laterality: Bilateral;   CHOLECYSTECTOMY     COLONOSCOPY  04/16/2008   WISDOM TOOTH EXTRACTION      Family History  Problem Relation Age of Onset   Depression Mother    Fibromyalgia Mother    Cancer Mother        breast   Ovarian cancer Mother    Diabetes Maternal Grandmother    Hypertension Maternal Grandmother    Arthritis Maternal Grandmother    Thyroid disease Maternal Grandmother    Colon polyps Maternal Grandmother    Irritable bowel syndrome Maternal Grandmother     Social:  reports that she has been smoking cigarettes. She has been exposed to tobacco smoke. She quit smokeless tobacco use about 9 years ago. She reports current drug use. Drug: Marijuana. She reports that she does not  drink alcohol.  Allergies: No Known Allergies  Medications: Current Outpatient Medications  Medication Instructions   albuterol (VENTOLIN HFA) 108 (90 Base) MCG/ACT inhaler 2 puffs, Inhalation, Every 6 hours PRN   hydrOXYzine (ATARAX) 25 mg, Oral, Every 6 hours PRN   ondansetron (ZOFRAN-ODT) 4 mg, Oral, Every 8 hours PRN   Spacer/Aero-Holding Chambers DEVI 1 Units, Does not apply, 3 times daily    ROS - all of the below systems have been reviewed with the patient and positives are indicated with bold text General: chills, fever or night sweats Eyes: blurry vision or double vision ENT: epistaxis or sore throat Allergy/Immunology: itchy/watery eyes or nasal congestion Hematologic/Lymphatic: bleeding problems, blood clots or swollen lymph nodes Endocrine: temperature intolerance or unexpected weight changes Breast: new or changing breast lumps or nipple discharge Resp: cough, shortness of breath, or wheezing CV: chest pain or dyspnea on exertion GI: as per HPI GU: dysuria, trouble voiding, or hematuria MSK: joint pain or joint stiffness Neuro: TIA or stroke symptoms Derm: pruritus and skin lesion changes Psych: anxiety and depression  Objective   PE Blood pressure 107/68, pulse (!) 58, temperature 98.5 F (36.9 C), temperature source Oral, resp. rate 17, last menstrual period 10/15/2023, SpO2 100%. Constitutional: NAD; conversant; no deformities Eyes: Moist conjunctiva; no lid lag; anicteric; PERRL  Neck: Trachea midline; no thyromegaly Lungs: Normal respiratory effort; no tactile fremitus CV: RRR; no palpable thrills; no pitting edema GI: Abd soft, tender over McBurney's point; no palpable hepatosplenomegaly MSK: Normal range of motion of extremities; no clubbing/cyanosis Psychiatric: Appropriate affect; alert and oriented x3 Lymphatic: No palpable cervical or axillary lymphadenopathy  Results for orders placed or performed during the hospital encounter of 10/22/23 (from the  past 24 hour(s))  Urinalysis, Routine w reflex microscopic -Urine, Clean Catch     Status: Abnormal   Collection Time: 10/22/23 12:12 PM  Result Value Ref Range   Color, Urine YELLOW YELLOW   APPearance HAZY (A) CLEAR   Specific Gravity, Urine 1.016 1.005 - 1.030   pH 6.0 5.0 - 8.0   Glucose, UA NEGATIVE NEGATIVE mg/dL   Hgb urine dipstick MODERATE (A) NEGATIVE   Bilirubin Urine NEGATIVE NEGATIVE   Ketones, ur NEGATIVE NEGATIVE mg/dL   Protein, ur NEGATIVE NEGATIVE mg/dL   Nitrite POSITIVE (A) NEGATIVE   Leukocytes,Ua NEGATIVE NEGATIVE   RBC / HPF 0-5 0 - 5 RBC/hpf   WBC, UA 0-5 0 - 5 WBC/hpf   Bacteria, UA MANY (A) NONE SEEN   Squamous Epithelial / HPF 6-10 0 - 5 /HPF   Mucus PRESENT   Lipase, blood     Status: None   Collection Time: 10/22/23 12:17 PM  Result Value Ref Range   Lipase 28 11 - 51 U/L  Comprehensive metabolic panel     Status: Abnormal   Collection Time: 10/22/23 12:17 PM  Result Value Ref Range   Sodium 137 135 - 145 mmol/L   Potassium 3.3 (L) 3.5 - 5.1 mmol/L   Chloride 106 98 - 111 mmol/L   CO2 25 22 - 32 mmol/L   Glucose, Bld 123 (H) 70 - 99 mg/dL   BUN 9 6 - 20 mg/dL   Creatinine, Ser 4.40 0.44 - 1.00 mg/dL   Calcium 8.6 (L) 8.9 - 10.3 mg/dL   Total Protein 6.5 6.5 - 8.1 g/dL   Albumin 3.9 3.5 - 5.0 g/dL   AST 16 15 - 41 U/L   ALT 11 0 - 44 U/L   Alkaline Phosphatase 59 38 - 126 U/L   Total Bilirubin 0.6 0.3 - 1.2 mg/dL   GFR, Estimated >34 >74 mL/min   Anion gap 6 5 - 15  CBC     Status: Abnormal   Collection Time: 10/22/23 12:17 PM  Result Value Ref Range   WBC 10.0 4.0 - 10.5 K/uL   RBC 3.82 (L) 3.87 - 5.11 MIL/uL   Hemoglobin 10.7 (L) 12.0 - 15.0 g/dL   HCT 25.9 (L) 56.3 - 87.5 %   MCV 87.4 80.0 - 100.0 fL   MCH 28.0 26.0 - 34.0 pg   MCHC 32.0 30.0 - 36.0 g/dL   RDW 64.3 32.9 - 51.8 %   Platelets 237 150 - 400 K/uL   nRBC 0.0 0.0 - 0.2 %  hCG, serum, qualitative     Status: None   Collection Time: 10/22/23 12:17 PM  Result Value Ref  Range   Preg, Serum NEGATIVE NEGATIVE     Imaging Orders         CT ABDOMEN PELVIS W CONTRAST      Assessment and Plan   Kim [name] is an 33 y.o. female with abdominal pain.  Her history and physical exam are quite consistent with acute appendicitis.  I reviewed her CT scan.  Her imaging suggests appendicitis but is not  convincing.  I reviewed her lab work and urinalysis.  There is also some bacteria and nitrates in her urine concerning for urinary tract infection however she has no symptoms of urinary tract infection.  I think her CT is limited by her body habitus.  I offered the patient the option for antibiotics or surgery for the treatment appendicitis.  I feel she is a candidate for antibiotics as she has no fecalith on imaging and there is no signs of perforation.  This also would help with treating any other possible infectious cause of her abdominal pain.  I also feel it is reasonable to proceed with surgery if she would opt for surgery.  Her symptoms are very consistent with appendicitis and her exam is also consistent.  We discussed laparoscopic appendectomy.  We discussed the procedure itself as well as its risk, benefits, and alternatives.  After full discussion all questions answered the patient would like to proceed with laparoscopic appendectomy.  We will proceed urgently to the operating room.    ICD-10-CM   1. Acute appendicitis, unspecified acute appendicitis type  K35.80        Quentin Ore, MD  [facility] Use [email or web address] to contact on call provider  New Patient Billing: [id number] - High MDM

## 2023-10-22 NOTE — Op Note (Signed)
Patient: Kim [name] (14-Jun-1990, [id number])  Date of Surgery: 10/22/2023  Preoperative Diagnosis: Appendicitis   Postoperative Diagnosis: Appendicitis  Surgical Procedure: APPENDECTOMY LAPAROSCOPIC:    Operative Team Members:  Surgeons and Role:    * Otto Felkins, Hyman Hopes, MD - Primary   Anesthesiologist: Achille Rich, MD CRNA: Laruth Bouchard., CRNA   Anesthesia: General   Fluids:  Total I/O In: 600 [I.V.:400; IV Piggyback:200] Out: 10 [Blood:10]  Complications: * No complications entered in OR log *  Drains:  none   Specimen:  ID Type Source Tests Collected by Time Destination  A :  GI Appendix  Kim [name], Hyman Hopes, MD 10/22/2023 2206      Disposition:  PACU - hemodynamically stable.  Plan of Care: Admit for overnight observation    Indications for Procedure: Kim [name] is a 33 y.o. female who presented with abdominal pain.  History, physical and imaging was concerning for appendicitis, so laparoscopic appendectomy was recommended for the patient.  The procedure itself, as well as the risks, benefits and alternatives were discussed with the patient.  Risks discussed included but were not limited to the risk of bleeding, infection, damage to nearby structures, need to convert to open procedure, incisional hernia, and the need for additional procedures or surgeries.  With this discussion complete and all questions answered the patient granted consent to proceed.  Findings: Inflamed appendix, omental adhesion to the lower midline abdominal wall  Infection status: Patient: Kim [name] Emergency General Surgery Service Patient Case: Urgent Infection Present At Time Of Surgery (PATOS):  Inflamed appendix   Description of Procedure:   On the date stated above, the patient was taken to the operating room suite and placed in supine positioning with the left arm tucked.  Sequential compression devices were placed on the lower extremities to prevent blood  clots.  General endotracheal anesthesia was induced.  The patient urinated just prior to surgery so a foley catheter was not placed.  Preoperative antibiotics were given.  The patient's abdomen was prepped and draped in the usual sterile fashion.  A time-out was completed verifying the correct patient, procedure, positioning and equipment needed for the case.  We began by anesthetizing the skin with local anesthetic and then making a 5 mm incision just below the umbilicus.  We dissected through the subcutaneous tissues to the fascia.  The fascia was grasped and elevated using a Kocher clamp.  A Veress needle was inserted into the abdomen and the abdomen was insufflated to 15 mmHg.  A 5 mm trocar was inserted in this position under optical guidance and then the abdomen was inspected.  There was no trauma to the underlying viscera with initial trocar placement.  Any abnormal findings, other than inflammation in the right lower quadrant, are listed above in the findings section.  Two additional trocars were placed, one 5 mm trocar suprapubically and one 12 mm trocar in the left lower quadrant.  These were placed under direct vision without any trauma to the underlying viscera.    The patient was then placed in head down, left side down positioning.  The appendix was identified and dissected free from its attachments to the abdominal wall, small intestine and cecum.  A window was created in the mesoappendix using blunt dissection.  We used one 60 mm white load of the endoscopic linear stapler to divide the base of the appendix from the cecum.  Then the harmonic scalpel was used to divide the mesoappendix.  The appendix was  placed in an endocatch bag and removed through the 12 mm port site in the left lower quadrant.  The operative field was dry on final inspection. The staple line was well formed.  There was good hemostasis at the end of the case.  At this point we directed our attention to closure.  The patient was  moved back to a level position.  The 12 mm trocar site was closed at the fascial level using an 0-vicryl on a fascial suture passer.  The abdomen was desufflated.  The skin was closed using 4-0 Monocryl and dermabond.  All sponge and needle counts were correct at the end of the case.    Ivar Drape, MD General, Bariatric, & Minimally Invasive Surgery [facility], [place]

## 2023-10-22 NOTE — ED Triage Notes (Signed)
Patient with constant central abdominal pain since 1700 last night. Endorses nausea with one episode of emesis.

## 2023-10-22 NOTE — ED Provider Notes (Signed)
Patient here today for evaluation of periumbilical pain that started last night.  She reports she has had nausea and has vomited once.  She states that area is very tender to touch.  Recommended further evaluation in the emergency room to rule out appendicitis.  Patient is agreeable to same and will be transported via POV after leaving urgent care.   Tomi Bamberger, PA-C 10/22/23 1137

## 2023-10-22 NOTE — ED Provider Triage Note (Signed)
Emergency Medicine Provider Triage Evaluation Note  Kim [name] , a 33 y.o. female  was evaluated in triage.  Pt complains of periumbilical abd pain onset yesterday, worse with driving over speed bumps or with coughing. Nausea, vomiting x 1 today. Pain is constant. Last bowel movement yesterday Prior abd surg includes chole Review of Systems  Positive: N/v, loss of appetite Negative: fever  Physical Exam  BP 133/87 (BP Location: Right Arm)   Pulse 80   Temp 98.1 F (36.7 C)   Resp 16   LMP 10/15/2023 (Exact Date)   SpO2 100%  Gen:   Awake, no distress   Resp:  Normal effort  MSK:   Moves extremities without difficulty  Other:    Medical Decision Making  Medically screening exam initiated at 12:38 PM.  Appropriate orders placed.  Kim [name] was informed that the remainder of the evaluation will be completed by another provider, this initial triage assessment does not replace that evaluation, and the importance of remaining in the ED until their evaluation is complete.     Jeannie Fend, PA-C 10/22/23 1239

## 2023-10-22 NOTE — Transfer of Care (Signed)
Immediate Anesthesia Transfer of Care Note  Patient: Kim [name]  Procedure(s) Performed: APPENDECTOMY LAPAROSCOPIC  Patient Location: PACU  Anesthesia Type:General  Level of Consciousness: awake, alert , and oriented  Airway & Oxygen Therapy: Patient Spontanous Breathing  Post-op Assessment: Report given to RN and Post -op Vital signs reviewed and stable  Post vital signs: Reviewed and stable  Last Vitals:  Vitals Value Taken Time  BP 117/66 10/22/23 2230  Temp 36.7 C 10/22/23 2220  Pulse 53 10/22/23 2243  Resp 18 10/22/23 2243  SpO2 96 % 10/22/23 2243  Vitals shown include unfiled device data.  Last Pain:  Vitals:   10/22/23 2220  TempSrc:   PainSc: 0-No pain         Complications: No notable events documented.

## 2023-10-22 NOTE — Anesthesia Procedure Notes (Signed)
Procedure Name: Intubation Date/Time: 10/22/2023 9:19 PM  Performed by: Laruth Bouchard., CRNAPre-anesthesia Checklist: Patient identified, Emergency Drugs available, Patient being monitored, Suction available and Timeout performed Patient Re-evaluated:Patient Re-evaluated prior to induction Oxygen Delivery Method: Circle system utilized Preoxygenation: Pre-oxygenation with 100% oxygen Induction Type: IV induction and Rapid sequence Laryngoscope Size: Miller and 2 Grade View: Grade I Tube type: Oral Tube size: 7.0 mm Number of attempts: 1 Airway Equipment and Method: Stylet Placement Confirmation: ETT inserted through vocal cords under direct vision, positive ETCO2 and breath sounds checked- equal and bilateral Secured at: 22 cm Tube secured with: Tape Dental Injury: Teeth and Oropharynx as per pre-operative assessment

## 2023-10-22 NOTE — Anesthesia Preprocedure Evaluation (Signed)
Anesthesia Evaluation  Patient identified by MRN, date of birth, ID band Patient awake    Reviewed: Allergy & Precautions, H&P , NPO status , Patient's Chart, lab work & pertinent test results  Airway Mallampati: II   Neck ROM: full    Dental   Pulmonary asthma , Current Smoker   breath sounds clear to auscultation       Cardiovascular negative cardio ROS  Rhythm:regular Rate:Normal     Neuro/Psych Seizures -,  PSYCHIATRIC DISORDERS Anxiety Depression Bipolar Disorder    Neuromuscular disease    GI/Hepatic appendicitis   Endo/Other    Renal/GU      Musculoskeletal  (+)  Fibromyalgia -  Abdominal   Peds  Hematology   Anesthesia Other Findings   Reproductive/Obstetrics                             Anesthesia Physical Anesthesia Plan  ASA: 2 and emergent  Anesthesia Plan: General   Post-op Pain Management:    Induction: Intravenous  PONV Risk Score and Plan: 2 and Ondansetron, Dexamethasone, Midazolam and Treatment may vary due to age or medical condition  Airway Management Planned: Oral ETT  Additional Equipment:   Intra-op Plan:   Post-operative Plan: Extubation in OR  Informed Consent: I have reviewed the patients History and Physical, chart, labs and discussed the procedure including the risks, benefits and alternatives for the proposed anesthesia with the patient or authorized representative who has indicated his/her understanding and acceptance.     Dental advisory given  Plan Discussed with: CRNA, Anesthesiologist and Surgeon  Anesthesia Plan Comments:        Anesthesia Quick Evaluation

## 2023-10-22 NOTE — ED Notes (Signed)
Consent e-form signed by patient and witnessed by ER nurse. Waiting for the doctor and anesthesiologist signature.

## 2023-10-22 NOTE — ED Notes (Signed)
ED TO INPATIENT HANDOFF REPORT  ED Nurse Name and Phone #: Francene Castle [phone]  S Name/Age/Gender Kim [name] 33 y.o. female Room/Bed: 023C/023C  Code Status   Code Status: Full Code  Home/SNF/Other Home Patient oriented to: self, place, time, and situation Is this baseline? Yes   Triage Complete: Triage complete  Chief Complaint Acute appendicitis [K35.80]  Triage Note Patient with constant central abdominal pain since 1700 last night. Endorses nausea with one episode of emesis.     Allergies No Known Allergies  Level of Care/Admitting Diagnosis ED Disposition     ED Disposition  Admit   Condition  --   Comment  Hospital Area: [facility] [[id number]]  Level of Care: Med-Surg [16]  May admit patient to [facility] or [facility] if equivalent level of care is available:: No  Covid Evaluation: Asymptomatic - no recent exposure (last 10 days) testing not required  Diagnosis: Acute appendicitis [454098]  Admitting Physician: CCS, MD [[id number]]  Attending Physician: CCS, MD [[id number]]  Certification:: I certify this patient will need inpatient services for at least 2 midnights  Expected Medical Readiness: 10/24/2023          B Medical/Surgery History Past Medical History:  Diagnosis Date   Anemia    Anxiety    Asthma    Chest pain    Depression    Fibromyalgia    Gestational diabetes 2010, 2012   Heartburn during pregnancy    Rectal bleeding    Seizures (HCC) 2015   x 1- unknown reason- passed out   Ulcer    Past Surgical History:  Procedure Laterality Date   CESAREAN SECTION     x 2   CESAREAN SECTION WITH BILATERAL TUBAL LIGATION Bilateral 07/03/2015   Procedure: REPEAT CESAREAN SECTION ;  Surgeon: Tereso Newcomer, MD;  Location: [facility];  Service: Obstetrics;  Laterality: Bilateral;   CESAREAN SECTION WITH BILATERAL TUBAL LIGATION Bilateral 09/05/2016   Procedure: REPEAT CESAREAN SECTION WITH BILATERAL TUBAL LIGATION;  Surgeon:  [place] Bing, MD;  Location: [facility];  Service: Obstetrics;  Laterality: Bilateral;   CHOLECYSTECTOMY     COLONOSCOPY  04/16/2008   WISDOM TOOTH EXTRACTION       A IV Location/Drains/Wounds Patient Lines/Drains/Airways Status     Active Line/Drains/Airways     Name Placement date Placement time Site Days   Peripheral IV 10/22/23 20 G Right Antecubital 10/22/23  1252  Antecubital  less than 1            Intake/Output Last 24 hours No intake or output data in the 24 hours ending 10/22/23 2023  Labs/Imaging Results for orders placed or performed during the hospital encounter of 10/22/23 (from the past 48 hour(s))  Urinalysis, Routine w reflex microscopic -Urine, Clean Catch     Status: Abnormal   Collection Time: 10/22/23 12:12 PM  Result Value Ref Range   Color, Urine YELLOW YELLOW   APPearance HAZY (A) CLEAR   Specific Gravity, Urine 1.016 1.005 - 1.030   pH 6.0 5.0 - 8.0   Glucose, UA NEGATIVE NEGATIVE mg/dL   Hgb urine dipstick MODERATE (A) NEGATIVE   Bilirubin Urine NEGATIVE NEGATIVE   Ketones, ur NEGATIVE NEGATIVE mg/dL   Protein, ur NEGATIVE NEGATIVE mg/dL   Nitrite POSITIVE (A) NEGATIVE   Leukocytes,Ua NEGATIVE NEGATIVE   RBC / HPF 0-5 0 - 5 RBC/hpf   WBC, UA 0-5 0 - 5 WBC/hpf   Bacteria, UA MANY (A) NONE SEEN  Squamous Epithelial / HPF 6-10 0 - 5 /HPF   Mucus PRESENT     Comment: Performed at [facility], [address] [place], [place] [address]  Lipase, blood     Status: None   Collection Time: 10/22/23 12:17 PM  Result Value Ref Range   Lipase 28 11 - 51 U/L    Comment: Performed at [facility], [address] [place], [place] [address]  Comprehensive metabolic panel     Status: Abnormal   Collection Time: 10/22/23 12:17 PM  Result Value Ref Range   Sodium 137 135 - 145 mmol/L   Potassium 3.3 (L) 3.5 - 5.1 mmol/L   Chloride 106 98 - 111 mmol/L   CO2 25 22 - 32 mmol/L   Glucose, Bld 123 (H) 70 - 99 mg/dL    Comment:  Glucose reference range applies only to samples taken after fasting for at least 8 hours.   BUN 9 6 - 20 mg/dL   Creatinine, Ser 1.30 0.44 - 1.00 mg/dL   Calcium 8.6 (L) 8.9 - 10.3 mg/dL   Total Protein 6.5 6.5 - 8.1 g/dL   Albumin 3.9 3.5 - 5.0 g/dL   AST 16 15 - 41 U/L   ALT 11 0 - 44 U/L   Alkaline Phosphatase 59 38 - 126 U/L   Total Bilirubin 0.6 0.3 - 1.2 mg/dL   GFR, Estimated >86 >57 mL/min    Comment: (NOTE) Calculated using the CKD-EPI Creatinine Equation (2021)    Anion gap 6 5 - 15    Comment: Performed at [facility], [address], [place], [place] [address]  CBC     Status: Abnormal   Collection Time: 10/22/23 12:17 PM  Result Value Ref Range   WBC 10.0 4.0 - 10.5 K/uL   RBC 3.82 (L) 3.87 - 5.11 MIL/uL   Hemoglobin 10.7 (L) 12.0 - 15.0 g/dL   HCT 29.5 (L) 28.4 - 13.2 %   MCV 87.4 80.0 - 100.0 fL   MCH 28.0 26.0 - 34.0 pg   MCHC 32.0 30.0 - 36.0 g/dL   RDW 44.0 10.2 - 72.5 %   Platelets 237 150 - 400 K/uL   nRBC 0.0 0.0 - 0.2 %    Comment: Performed at [facility], [address] [place], [place] [address]  hCG, serum, qualitative     Status: None   Collection Time: 10/22/23 12:17 PM  Result Value Ref Range   Preg, Serum NEGATIVE NEGATIVE    Comment:        THE SENSITIVITY OF THIS METHODOLOGY IS >10 mIU/mL. Performed at [facility], [address], [place], [place] [address]    CT ABDOMEN PELVIS W CONTRAST  Result Date: 10/22/2023 CLINICAL DATA:  Right lower quadrant abdominal pain. EXAM: CT ABDOMEN AND PELVIS WITH CONTRAST TECHNIQUE: Multidetector CT imaging of the abdomen and pelvis was performed using the standard protocol following bolus administration of intravenous contrast. RADIATION DOSE REDUCTION: This exam was performed according to the departmental dose-optimization program which includes automated exposure control, adjustment of the mA and/or kV according to patient size and/or use of iterative reconstruction technique.  CONTRAST:  75mL OMNIPAQUE IOHEXOL 350 MG/ML SOLN COMPARISON:  CT 10/04/2022 FINDINGS: Lower chest: Clear lung bases. Hepatobiliary: Small cyst in the left lobe of the liver. No suspicious liver lesion. Clips in the gallbladder fossa postcholecystectomy. No biliary dilatation. Pancreas: No ductal dilatation or inflammation. Spleen: Normal in size without focal  abnormality. Adrenals/Urinary Tract: No adrenal nodule. Early excretion of IV contrast in the renal collecting systems. No hydronephrosis or renal inflammation. Urinary bladder is unremarkable. Stomach/Bowel: The midportion of the appendix is mildly dilated at 7 mm, series 6, image 47 and series 3, image 58. There may be mild appendiceal wall thickening and trace fluid in the mid appendix. Findings may represent early appendicitis. Occasional fluid-filled loops of small bowel in the pelvis. No obstruction. Unremarkable appearance of the stomach. Small volume of stool in the colon. Vascular/Lymphatic: No acute vascular findings. The abdominal aorta is normal in caliber. The portal vein is patent. There is prominent periuterine and adnexal vascularity with dilatation of the ovarian veins, 7 mm on the left and 6 mm on the right. No abdominopelvic adenopathy. Reproductive: Prominent periuterine and adnexal vascularity. Anteverted uterus. No obvious adnexal mass. Other: No free air or ascites. No abdominopelvic collection. No abdominal wall hernia. Musculoskeletal: Mild scoliosis. There are no acute or suspicious osseous abnormalities. IMPRESSION: 1. Mild dilatation of the mid appendix with possible mild wall thickening and trace intraluminal fluid. Findings may represent early appendicitis. 2. Prominent periuterine and adnexal vascularity with dilatation of the ovarian veins. This can be seen with pelvic congestion syndrome in the appropriate clinical setting. Electronically Signed   By: Narda Rutherford M.D.   On: 10/22/2023 19:15    Pending Labs Unresulted  Labs (From admission, onward)     Start     Ordered   10/29/23 0500  Creatinine, serum  (enoxaparin (LOVENOX)    CrCl >/= 30 ml/min)  Weekly,   R     Comments: while on enoxaparin therapy    10/22/23 2005   10/23/23 0500  Basic metabolic panel  Daily,   R      10/22/23 2005   10/23/23 0500  CBC  Daily,   R      10/22/23 2005   10/22/23 2003  HIV Antibody (routine testing w rflx)  (HIV Antibody (Routine testing w reflex) panel)  Once,   R        10/22/23 2005   10/22/23 2003  CBC  (enoxaparin (LOVENOX)    CrCl >/= 30 ml/min)  Once,   R       Comments: Baseline for enoxaparin therapy IF NOT ALREADY DRAWN.  Notify MD if PLT < 100 K.    10/22/23 2005   10/22/23 2003  Creatinine, serum  (enoxaparin (LOVENOX)    CrCl >/= 30 ml/min)  Once,   R       Comments: Baseline for enoxaparin therapy IF NOT ALREADY DRAWN.    10/22/23 2005            Vitals/Pain Today's Vitals   10/22/23 1800 10/22/23 1815 10/22/23 1915 10/22/23 1916  BP: 99/66 101/62  107/68  Pulse: (!) 59 (!) 52 (!) 37 (!) 58  Resp:    17  Temp:    98.5 F (36.9 C)  TempSrc:    Oral  SpO2: 99% 100% 100% 100%  PainSc:        Isolation Precautions No active isolations  Medications Medications  enoxaparin (LOVENOX) injection 40 mg (has no administration in time range)  lactated ringers infusion (has no administration in time range)  cefTRIAXone (ROCEPHIN) 2 g in sodium chloride 0.9 % 100 mL IVPB (has no administration in time range)    And  metroNIDAZOLE (FLAGYL) IVPB 500 mg (has no administration in time range)  acetaminophen (TYLENOL) tablet 650 mg (has no administration in time  range)  ketorolac (TORADOL) 15 MG/ML injection 15 mg (has no administration in time range)  gabapentin (NEURONTIN) tablet 300 mg (has no administration in time range)  oxyCODONE (Oxy IR/ROXICODONE) immediate release tablet 5 mg (has no administration in time range)  oxyCODONE (Oxy IR/ROXICODONE) immediate release tablet 10 mg (has no  administration in time range)  HYDROmorphone (DILAUDID) injection 0.5 mg (has no administration in time range)  methocarbamol (ROBAXIN) injection 500 mg (has no administration in time range)  ondansetron (ZOFRAN) injection 4 mg (has no administration in time range)  prochlorperazine (COMPAZINE) injection 10 mg (has no administration in time range)  simethicone (MYLICON) chewable tablet 80 mg (has no administration in time range)  docusate sodium (COLACE) capsule 100 mg (has no administration in time range)  ondansetron (ZOFRAN-ODT) disintegrating tablet 4 mg (4 mg Oral Given 10/22/23 1924)  iohexol (OMNIPAQUE) 350 MG/ML injection 75 mL (75 mLs Intravenous Contrast Given 10/22/23 1619)    Mobility walks     Focused Assessments    R Recommendations: See Admitting Provider Note  Report given to: Oliver Barre  Additional Notes:

## 2023-10-23 ENCOUNTER — Encounter (HOSPITAL_COMMUNITY): Payer: Self-pay | Admitting: Surgery

## 2023-10-23 LAB — BASIC METABOLIC PANEL
Anion gap: 8 (ref 5–15)
BUN: 9 mg/dL (ref 6–20)
CO2: 25 mmol/L (ref 22–32)
Calcium: 8.8 mg/dL — ABNORMAL LOW (ref 8.9–10.3)
Chloride: 103 mmol/L (ref 98–111)
Creatinine, Ser: 0.83 mg/dL (ref 0.44–1.00)
GFR, Estimated: >60 mL/min (ref >60)
Glucose, Bld: 136 mg/dL — ABNORMAL HIGH (ref 70–99)
Potassium: 3.8 mmol/L (ref 3.5–5.1)
Sodium: 136 mmol/L (ref 135–145)

## 2023-10-23 LAB — HIV ANTIBODY (ROUTINE TESTING W REFLEX): HIV Screen 4th Generation wRfx: NONREACTIVE

## 2023-10-23 LAB — CBC
HCT: 31.2 % — ABNORMAL LOW (ref 36.0–46.0)
HCT: 32.3 % — ABNORMAL LOW (ref 36.0–46.0)
Hemoglobin: 10.3 g/dL — ABNORMAL LOW (ref 12.0–15.0)
Hemoglobin: 10.6 g/dL — ABNORMAL LOW (ref 12.0–15.0)
MCH: 28.6 pg (ref 26.0–34.0)
MCH: 28.7 pg (ref 26.0–34.0)
MCHC: 32.8 g/dL (ref 30.0–36.0)
MCHC: 33 g/dL (ref 30.0–36.0)
MCV: 86.9 fL (ref 80.0–100.0)
MCV: 87.3 fL (ref 80.0–100.0)
Platelets: 211 10*3/uL (ref 150–400)
Platelets: 230 10*3/uL (ref 150–400)
RBC: 3.59 MIL/uL — ABNORMAL LOW (ref 3.87–5.11)
RBC: 3.7 MIL/uL — ABNORMAL LOW (ref 3.87–5.11)
RDW: 14.6 % (ref 11.5–15.5)
RDW: 14.6 % (ref 11.5–15.5)
WBC: 11 10*3/uL — ABNORMAL HIGH (ref 4.0–10.5)
WBC: 12.4 10*3/uL — ABNORMAL HIGH (ref 4.0–10.5)
nRBC: 0 % (ref 0.0–0.2)
nRBC: 0 % (ref 0.0–0.2)

## 2023-10-23 LAB — CREATININE, SERUM
Creatinine, Ser: 0.86 mg/dL (ref 0.44–1.00)
GFR, Estimated: >60 mL/min (ref >60)

## 2023-10-23 MED ORDER — DOCUSATE SODIUM 100 MG PO CAPS: 100.0000 mg | ORAL_CAPSULE | Freq: Two times a day (BID) | ORAL | 0 refills | Status: DC

## 2023-10-23 MED ORDER — IBUPROFEN 200 MG PO TABS
600.0000 mg | ORAL_TABLET | Freq: Three times a day (TID) | ORAL | Status: AC | PRN
Start: 2023-10-23 — End: 2023-10-30

## 2023-10-23 MED ORDER — IBUPROFEN 800 MG PO TABS
800.0000 mg | ORAL_TABLET | Freq: Once | ORAL | Status: AC
  Administered 2023-10-23: 800 mg via ORAL
  Filled 2023-10-23: qty 1

## 2023-10-23 MED ORDER — TRAMADOL HCL 50 MG PO TABS: 50.0000 mg | ORAL_TABLET | Freq: Four times a day (QID) | ORAL | 0 refills | Status: DC | PRN

## 2023-10-23 MED ORDER — ACETAMINOPHEN 325 MG PO TABS: 650.0000 mg | ORAL_TABLET | Freq: Four times a day (QID) | ORAL | Status: DC | PRN

## 2023-10-23 NOTE — Discharge Summary (Signed)
[facility] Discharge Summary   Patient ID: Kim [name] MRN: [id number] DOB/AGE: 35/01/1990 35 y.o.  Admit date: 10/22/2023 Discharge date: 10/23/2023  Admitting Diagnosis: Appendicitis   Discharge Diagnosis Patient Active Problem List   Diagnosis Date Noted   Acute appendicitis 10/22/2023   Vitamin D deficiency 03/25/2023   Biliary colic 11/05/2022   Dyspareunia in female 04/24/2022   Abnormal uterine bleeding 04/24/2022   Anxiety and depression 04/24/2022   Psychophysiological insomnia 04/24/2022   MDD (major depressive disorder), recurrent severe, without psychosis (HCC) 07/08/2018   Suicide attempt (HCC) 07/08/2018   Asthma, mild 07/07/2018   Cannabis abuse 07/07/2018   Ileus, postoperative (HCC) 09/10/2016   Status post tubal ligation 09/10/2016   Status post repeat low transverse cesarean section 09/05/2016   Anemia of mother in pregnancy, antepartum 06/29/2016   GBS bacteriuria 04/06/2016   Asthma affecting pregnancy, antepartum 02/08/2016   Short interval between pregnancies affecting pregnancy, antepartum 02/08/2016   Previous cesarean section complicating pregnancy, antepartum condition or complication 12/14/2014   H/O pre-eclampsia in prior pregnancy, currently pregnant 12/14/2014   Asthma, moderate persistent, well-controlled 12/14/2014   Tobacco smoking affecting pregnancy, antepartum 12/14/2014   Bipolar depression (HCC) 12/14/2014   Supervision of normal pregnancy 11/16/2014   History of diabetes as a child and gestational diabetes 11/16/2014   Underweight 11/16/2014    Consultants None   Imaging: CT ABDOMEN PELVIS W CONTRAST  Result Date: 10/22/2023 CLINICAL DATA:  Right lower quadrant abdominal pain. EXAM: CT ABDOMEN AND PELVIS WITH CONTRAST TECHNIQUE: Multidetector CT imaging of the abdomen and pelvis was performed using the standard protocol following bolus administration of intravenous contrast. RADIATION DOSE REDUCTION: This  exam was performed according to the departmental dose-optimization program which includes automated exposure control, adjustment of the mA and/or kV according to patient size and/or use of iterative reconstruction technique. CONTRAST:  75mL OMNIPAQUE IOHEXOL 350 MG/ML SOLN COMPARISON:  CT 10/04/2022 FINDINGS: Lower chest: Clear lung bases. Hepatobiliary: Small cyst in the left lobe of the liver. No suspicious liver lesion. Clips in the gallbladder fossa postcholecystectomy. No biliary dilatation. Pancreas: No ductal dilatation or inflammation. Spleen: Normal in size without focal abnormality. Adrenals/Urinary Tract: No adrenal nodule. Early excretion of IV contrast in the renal collecting systems. No hydronephrosis or renal inflammation. Urinary bladder is unremarkable. Stomach/Bowel: The midportion of the appendix is mildly dilated at 7 mm, series 6, image 47 and series 3, image 58. There may be mild appendiceal wall thickening and trace fluid in the mid appendix. Findings may represent early appendicitis. Occasional fluid-filled loops of small bowel in the pelvis. No obstruction. Unremarkable appearance of the stomach. Small volume of stool in the colon. Vascular/Lymphatic: No acute vascular findings. The abdominal aorta is normal in caliber. The portal vein is patent. There is prominent periuterine and adnexal vascularity with dilatation of the ovarian veins, 7 mm on the left and 6 mm on the right. No abdominopelvic adenopathy. Reproductive: Prominent periuterine and adnexal vascularity. Anteverted uterus. No obvious adnexal mass. Other: No free air or ascites. No abdominopelvic collection. No abdominal wall hernia. Musculoskeletal: Mild scoliosis. There are no acute or suspicious osseous abnormalities. IMPRESSION: 1. Mild dilatation of the mid appendix with possible mild wall thickening and trace intraluminal fluid. Findings may represent early appendicitis. 2. Prominent periuterine and adnexal vascularity with  dilatation of the ovarian veins. This can be seen with pelvic congestion syndrome in the appropriate clinical setting. Electronically Signed   By: Narda Rutherford M.D.   On: 10/22/2023 19:15  Procedures Dr. Renae Fickle Stechschulte (10/23/23) - Laparoscopic Appendectomy  Hospital Course:  33 y/o F who [facility] to [facility] with RLQ pain and vomiting.  Workup showed appendicitis.  Patient was admitted and underwent procedure listed above.  Tolerated procedure well and was transferred to the floor.  Diet was advanced as tolerated.  On POD#1, the patient was voiding well, tolerating diet, ambulating well, pain well controlled, vital signs stable, incisions c/d/i and felt stable for discharge home.  Patient will follow up in our office ias below and knows to call with questions or concerns.   Of note the patient did have asymptomatic bradycardia on POD#1 (HR 44 bpm).  EKG showed sinus bradycardia. Reviewed with MD who felt patient was stable for discharge.   I have personally reviewed the patients medication history on the [place] controlled substance database.   Physical Exam: General:  Alert, NAD, pleasant, comfortable Abd:  Soft, ND, mild tenderness, incisions C/D/I  Allergies as of 10/23/2023   No Known Allergies      Medication List     TAKE these medications    acetaminophen 325 MG tablet Commonly known as: TYLENOL Take 2-3 tablets (650-975 mg total) by mouth every 6 (six) hours as needed.   albuterol 108 (90 Base) MCG/ACT inhaler Commonly known as: VENTOLIN HFA Inhale 2 puffs into the lungs every 6 (six) hours as needed for wheezing or shortness of breath.   docusate sodium 100 MG capsule Commonly known as: COLACE Take 1 capsule (100 mg total) by mouth 2 (two) times daily.   ibuprofen 200 MG tablet Commonly known as: ADVIL Take 3 tablets (600 mg total) by mouth every 8 (eight) hours as needed for up to 7 days.   Spacer/Aero-Holding [employer] 1 Units by Does not apply route in  the morning, at noon, and at bedtime.   traMADol 50 MG tablet Commonly known as: Ultram Take 1-2 tablets (50-100 mg total) by mouth every 6 (six) hours as needed for severe pain (pain score 7-10) (not releived by tylenol or advil).          Follow-up Information     Stechschulte, Hyman Hopes, MD Follow up.   Specialty: Surgery Why: our office is scheduling you for post-operative follow up, call to confirm appointment date/time. Contact information: [address] [place] [address] [phone]                 Signed: Hosie Spangle, [facility] 10/23/2023, 12:02 PM

## 2023-10-23 NOTE — Progress Notes (Signed)
Pt given discharge instructions with no concerns voiced. Pt voiced understanding all instructions

## 2023-10-23 NOTE — Plan of Care (Signed)

## 2023-10-23 NOTE — Discharge Instructions (Signed)

## 2023-10-24 LAB — SURGICAL PATHOLOGY

## 2023-10-24 NOTE — Anesthesia Postprocedure Evaluation (Signed)
Anesthesia Post Note  Patient: Kim [name]  Procedure(s) Performed: APPENDECTOMY LAPAROSCOPIC (Abdomen)     Patient location during evaluation: PACU Anesthesia Type: General Level of consciousness: awake and alert Pain management: pain level controlled Vital Signs Assessment: post-procedure vital signs reviewed and stable Respiratory status: spontaneous breathing, nonlabored ventilation, respiratory function stable and patient connected to nasal cannula oxygen Cardiovascular status: blood pressure returned to baseline and stable Postop Assessment: no apparent nausea or vomiting Anesthetic complications: no   No notable events documented.  Last Vitals:  Vitals:   10/23/23 0838 10/23/23 1045  BP: 94/61 (!) 105/53  Pulse: (!) 44 (!) 43  Resp: 16 16  Temp: 36.8 C 36.7 C  SpO2: 99% 100%    Last Pain:  Vitals:   10/23/23 1112  TempSrc:   PainSc: 6                  Erinne Gillentine S

## 2024-01-29 ENCOUNTER — Ambulatory Visit: Payer: Medicaid Other | Admitting: Family Medicine

## 2024-01-29 ENCOUNTER — Other Ambulatory Visit (HOSPITAL_COMMUNITY)
Admission: RE | Admit: 2024-01-29 | Discharge: 2024-01-29 | Disposition: A | Discharge status: Home / Self Care | Payer: Medicaid Other | Source: Ambulatory Visit | Attending: Family Medicine | Admitting: Family Medicine

## 2024-01-29 ENCOUNTER — Encounter: Payer: Self-pay | Admitting: Family Medicine

## 2024-01-29 VITALS — BP 105/68 | HR 87 | Temp 98.1°F | Resp 16 | Ht 68.0 in | Wt 107.6 lb

## 2024-01-29 DIAGNOSIS — Z13220 Encounter for screening for lipoid disorders: Secondary | ICD-10-CM

## 2024-01-29 DIAGNOSIS — Z0000 Encounter for general adult medical examination without abnormal findings: Secondary | ICD-10-CM

## 2024-01-29 DIAGNOSIS — Z113 Encounter for screening for infections with a predominantly sexual mode of transmission: Secondary | ICD-10-CM | POA: Diagnosis present

## 2024-01-29 DIAGNOSIS — F17210 Nicotine dependence, cigarettes, uncomplicated: Secondary | ICD-10-CM | POA: Diagnosis not present

## 2024-01-29 DIAGNOSIS — Z124 Encounter for screening for malignant neoplasm of cervix: Secondary | ICD-10-CM | POA: Diagnosis present

## 2024-01-29 DIAGNOSIS — Z130 Encounter for screening for diseases of the blood and blood-forming organs and certain disorders involving the immune mechanism: Secondary | ICD-10-CM

## 2024-01-29 DIAGNOSIS — Z01419 Encounter for gynecological examination (general) (routine) without abnormal findings: Secondary | ICD-10-CM

## 2024-01-30 ENCOUNTER — Encounter: Payer: Self-pay | Admitting: Family Medicine

## 2024-01-30 LAB — LIPID PANEL
Chol/HDL Ratio: 2 {ratio} (ref 0.0–4.4)
Cholesterol, Total: 110 mg/dL (ref 100–199)
HDL: 55 mg/dL (ref >39)
LDL Chol Calc (NIH): 44 mg/dL (ref 0–99)
Triglycerides: 40 mg/dL (ref 0–149)
VLDL Cholesterol Cal: 11 mg/dL (ref 5–40)

## 2024-01-30 LAB — CMP14+EGFR
ALT: 11 [IU]/L (ref 0–32)
AST: 19 [IU]/L (ref 0–40)
Albumin: 4.4 g/dL (ref 3.9–4.9)
Alkaline Phosphatase: 70 [IU]/L (ref 44–121)
BUN/Creatinine Ratio: 10 (ref 9–23)
BUN: 7 mg/dL (ref 6–20)
Bilirubin Total: 0.2 mg/dL (ref 0.0–1.2)
CO2: 24 mmol/L (ref 20–29)
Calcium: 9 mg/dL (ref 8.7–10.2)
Chloride: 104 mmol/L (ref 96–106)
Creatinine, Ser: 0.72 mg/dL (ref 0.57–1.00)
Globulin, Total: 2 g/dL (ref 1.5–4.5)
Glucose: 78 mg/dL (ref 70–99)
Potassium: 3.8 mmol/L (ref 3.5–5.2)
Sodium: 142 mmol/L (ref 134–144)
Total Protein: 6.4 g/dL (ref 6.0–8.5)
eGFR: 112 mL/min/{1.73_m2} (ref >59)

## 2024-01-30 LAB — CBC WITH DIFFERENTIAL/PLATELET
Basophils Absolute: 0.1 10*3/uL (ref 0.0–0.2)
Basos: 1 %
EOS (ABSOLUTE): 0.2 10*3/uL (ref 0.0–0.4)
Eos: 5 %
Hematocrit: 35.1 % (ref 34.0–46.6)
Hemoglobin: 11.3 g/dL (ref 11.1–15.9)
Immature Grans (Abs): 0 10*3/uL (ref 0.0–0.1)
Immature Granulocytes: 0 %
Lymphocytes Absolute: 1.6 10*3/uL (ref 0.7–3.1)
Lymphs: 36 %
MCH: 28.5 pg (ref 26.6–33.0)
MCHC: 32.2 g/dL (ref 31.5–35.7)
MCV: 88 fL (ref 79–97)
Monocytes Absolute: 0.4 10*3/uL (ref 0.1–0.9)
Monocytes: 10 %
Neutrophils Absolute: 2.1 10*3/uL (ref 1.4–7.0)
Neutrophils: 48 %
Platelets: 227 10*3/uL (ref 150–450)
RBC: 3.97 x10E6/uL (ref 3.77–5.28)
RDW: 13.1 % (ref 11.7–15.4)
WBC: 4.4 10*3/uL (ref 3.4–10.8)

## 2024-01-30 LAB — CERVICOVAGINAL ANCILLARY ONLY
Bacterial Vaginitis (gardnerella): NEGATIVE
Candida Glabrata: POSITIVE — AB
Candida Vaginitis: NEGATIVE
Chlamydia: NEGATIVE
Comment: NEGATIVE
Comment: NEGATIVE
Comment: NEGATIVE
Comment: NEGATIVE
Comment: NEGATIVE
Comment: NORMAL
Neisseria Gonorrhea: NEGATIVE
Trichomonas: NEGATIVE

## 2024-01-30 NOTE — Progress Notes (Signed)
Established Patient Office Visit  Subjective    Patient ID: Kim [name], female    DOB: January 20, 1990  Age: 34 y.o. MRN: [id number]  CC:  Chief Complaint  Patient presents with   Annual Exam    Pap smear    HPI Kim [name] presents for routine annual exam. Patient denies known chronic med issues or taking any meds on a regula basis.   Outpatient Encounter Medications as of 01/29/2024  Medication Sig   albuterol (VENTOLIN HFA) 108 (90 Base) MCG/ACT inhaler Inhale 2 puffs into the lungs every 6 (six) hours as needed for wheezing or shortness of breath.   Spacer/Aero-Holding Chambers DEVI 1 Units by Does not apply route in the morning, at noon, and at bedtime.   acetaminophen (TYLENOL) 325 MG tablet Take 2-3 tablets (650-975 mg total) by mouth every 6 (six) hours as needed.   docusate sodium (COLACE) 100 MG capsule Take 1 capsule (100 mg total) by mouth 2 (two) times daily.   traMADol (ULTRAM) 50 MG tablet Take 1-2 tablets (50-100 mg total) by mouth every 6 (six) hours as needed for severe pain (pain score 7-10) (not releived by tylenol or advil).   No facility-administered encounter medications on file as of 01/29/2024.    Past Medical History:  Diagnosis Date   Anemia    Anxiety    Asthma    Chest pain    Depression    Fibromyalgia    Gestational diabetes 2010, 2012   Heartburn during pregnancy    Rectal bleeding    Seizures (HCC) 2015   x 1- unknown reason- passed out   Ulcer     Past Surgical History:  Procedure Laterality Date   CESAREAN SECTION     x 2   CESAREAN SECTION WITH BILATERAL TUBAL LIGATION Bilateral 07/03/2015   Procedure: REPEAT CESAREAN SECTION ;  Surgeon: Tereso Newcomer, MD;  Location: [facility];  Service: Obstetrics;  Laterality: Bilateral;   CESAREAN SECTION WITH BILATERAL TUBAL LIGATION Bilateral 09/05/2016   Procedure: REPEAT CESAREAN SECTION WITH BILATERAL TUBAL LIGATION;  Surgeon: Erie Bing, MD;  [facility];   Service: Obstetrics;  Laterality: Bilateral;   CHOLECYSTECTOMY     COLONOSCOPY  04/16/2008   LAPAROSCOPIC APPENDECTOMY N/A 10/22/2023   Procedure: APPENDECTOMY LAPAROSCOPIC;  Surgeon: Stechschulte, Hyman Hopes, MD;  Location: [facility];  Service: General;  Laterality: N/A;   WISDOM TOOTH EXTRACTION      Family History  Problem Relation Age of Onset   Depression Mother    Fibromyalgia Mother    Cancer Mother        breast   Ovarian cancer Mother    Diabetes Maternal Grandmother    Hypertension Maternal Grandmother    Arthritis Maternal Grandmother    Thyroid disease Maternal Grandmother    Colon polyps Maternal Grandmother    Irritable bowel syndrome Maternal Grandmother     Social History   Socioeconomic History   Marital status: Single    Spouse name: Not on file   Number of children: Not on file   Years of education: Not on file   Highest education level: Some college, no degree  Occupational History   Not on file  Tobacco Use   Smoking status: Every Day    Current packs/day: 0.50    Types: Cigarettes    Passive exposure: Current   Smokeless tobacco: Former    Quit date: 08/24/2014  Vaping Use   Vaping status: Never Used  Substance and  Sexual Activity   Alcohol use: No   Drug use: Yes    Types: Marijuana   Sexual activity: Yes    Birth control/protection: None  Other Topics Concern   Not on file  Social History Narrative   Not on file   Social Drivers of Health   Financial Resource Strain: Low Risk  (01/28/2024)   Overall Financial Resource Strain (CARDIA)    Difficulty of Paying Living Expenses: Not very hard  Food Insecurity: No Food Insecurity (01/28/2024)   Hunger Vital Sign    Worried About Running Out of Food in the Last Year: Never true    Ran Out of Food in the Last Year: Never true  Transportation Needs: No Transportation Needs (01/28/2024)   PRAPARE - Administrator, Civil Service (Medical): No    Lack of Transportation (Non-Medical): No   Physical Activity: Sufficiently Active (01/28/2024)   Exercise Vital Sign    Days of Exercise per Week: 4 days    Minutes of Exercise per Session: 100 min  Stress: Stress Concern Present (01/28/2024)   [employer] of Occupational Health - Occupational Stress Questionnaire    Feeling of Stress : To some extent  Social Connections: Moderately Isolated (01/28/2024)   Social Connection and Isolation Panel [NHANES]    Frequency of Communication with Friends and Family: Three times a week    Frequency of Social Gatherings with Friends and Family: Once a week    Attends Religious Services: Never    Database administrator or Organizations: No    Attends Engineer, structural: Not on file    Marital Status: Living with partner  Intimate Partner Violence: Not At Risk (10/22/2023)   Humiliation, Afraid, Rape, and Kick questionnaire    Fear of Current or Ex-Partner: No    Emotionally Abused: No    Physically Abused: No    Sexually Abused: No    Review of Systems  All other systems reviewed and are negative.       Objective    BP 105/68 (BP Location: Right Arm, Patient Position: Sitting, Cuff Size: Small)   Pulse 87   Temp 98.1 F (36.7 C) (Oral)   Resp 16   Ht 5\' 8"  (1.727 m)   Wt 107 lb 9.6 oz (48.8 kg)   SpO2 97%   BMI 16.36 kg/m   Physical Exam Vitals and nursing note reviewed.  Constitutional:      General: She is not in acute distress. HENT:     Head: Normocephalic and atraumatic.     Right Ear: Tympanic membrane, ear canal and external ear normal.     Left Ear: Tympanic membrane, ear canal and external ear normal.     Nose: Nose normal.     Mouth/Throat:     Mouth: Mucous membranes are moist.     Pharynx: Oropharynx is clear.  Eyes:     Conjunctiva/sclera: Conjunctivae normal.     Pupils: Pupils are equal, round, and reactive to light.  Neck:     Thyroid: No thyromegaly.  Cardiovascular:     Rate and Rhythm: Normal rate and regular rhythm.      Heart sounds: Normal heart sounds. No murmur heard. Pulmonary:     Effort: Pulmonary effort is normal. No respiratory distress.     Breath sounds: Normal breath sounds.  Abdominal:     General: There is no distension.     Palpations: Abdomen is soft. There is no mass.  Tenderness: There is no abdominal tenderness.     Hernia: There is no hernia in the left inguinal area or right inguinal area.  Genitourinary:    General: Normal vulva.     Exam position: Supine.     Labia:        Right: No lesion.        Left: No lesion.      Vagina: Normal.     Cervix: Normal.     Uterus: Normal.      Adnexa: Right adnexa normal.     Rectum: Normal.  Musculoskeletal:        General: Normal range of motion.     Cervical back: Normal range of motion and neck supple.  Skin:    General: Skin is warm and dry.  Neurological:     General: No focal deficit present.     Mental Status: She is alert and oriented to person, place, and time.  Psychiatric:        Mood and Affect: Mood normal.        Behavior: Behavior normal.         Assessment & Plan:   Annual physical exam -     CMP14+EGFR  Screen for STD (sexually transmitted disease) -     Cervicovaginal ancillary only  Pap smear, as part of routine gynecological examination  Screening for deficiency anemia -     CBC with Differential/Platelet  Screening for lipid disorders -     Lipid panel  Pap smear for cervical cancer screening -     Cytology - PAP     No follow-ups on file.   Tommie Raymond, MD

## 2024-01-31 ENCOUNTER — Other Ambulatory Visit: Payer: Self-pay | Admitting: Family Medicine

## 2024-01-31 MED ORDER — FLUCONAZOLE 150 MG PO TABS: 150.0000 mg | ORAL_TABLET | Freq: Once | ORAL | 0 refills | Status: AC

## 2024-01-31 NOTE — Progress Notes (Signed)
I called patient and made her aware that MD Andrey Campanile stated she was Positive for candida - diflucan prescribed.  Patient stated she already never and came up her and got prescription.

## 2024-02-03 LAB — CYTOLOGY - PAP
Diagnosis: NEGATIVE
Diagnosis: REACTIVE

## 2024-02-06 ENCOUNTER — Other Ambulatory Visit: Payer: Self-pay | Admitting: Family Medicine

## 2024-02-06 NOTE — Telephone Encounter (Signed)
 Copied from CRM [id number]. Topic: Clinical - Medication Refill >> Feb 06, 2024  8:37 AM Alfonso ORN wrote: Most Recent Primary Care Visit:  Provider: TANDA BLEACHER  Department: [facility]  Visit Type: PHYSICAL  Date: 01/29/2024  Medication: albuterol  (VENTOLIN  HFA) 108 (90 Base) MCG/ACT inhaler  Has the patient contacted their pharmacy? Yes ,  (Agent: If no, request that the patient contact the pharmacy for the refill. If patient does not wish to contact the pharmacy document the reason why and proceed with request.) (Agent: If yes, when and what did the pharmacy advise?)  Is this the correct pharmacy for this prescription? Yes If no, delete pharmacy and type the correct one.  This is the patient's preferred pharmacy:  [facility] - [place], [place] - [address] [place] [address] Phone: [phone] Fax: [phone]   Has the prescription been filled recently? Yes  Is the patient out of the medication? Yes  Has the patient been seen for an appointment in the last year OR does the patient have an upcoming appointment? Yes  Can we respond through MyChart? Yes  Agent: Please be advised that Rx refills may take up to 3 business days. We ask that you follow-up with your pharmacy.

## 2024-02-07 MED ORDER — ALBUTEROL SULFATE HFA 108 (90 BASE) MCG/ACT IN AERS: 2.0000 | INHALATION_SPRAY | Freq: Four times a day (QID) | RESPIRATORY_TRACT | 0 refills | Status: AC | PRN

## 2024-03-23 ENCOUNTER — Encounter: Payer: Self-pay | Admitting: Obstetrics and Gynecology

## 2024-03-23 ENCOUNTER — Ambulatory Visit: Payer: Medicaid Other | Admitting: Obstetrics and Gynecology

## 2024-03-23 VITALS — BP 107/70 | HR 85 | Ht 68.0 in | Wt 107.0 lb

## 2024-03-23 DIAGNOSIS — N939 Abnormal uterine and vaginal bleeding, unspecified: Secondary | ICD-10-CM

## 2024-03-23 DIAGNOSIS — R935 Abnormal findings on diagnostic imaging of other abdominal regions, including retroperitoneum: Secondary | ICD-10-CM | POA: Insufficient documentation

## 2024-03-23 DIAGNOSIS — Z9229 Personal history of other drug therapy: Secondary | ICD-10-CM | POA: Diagnosis not present

## 2024-03-23 NOTE — Progress Notes (Unsigned)
 Obstetrics and Gynecology New Patient Evaluation  Appointment Date: 03/23/2024  OBGYN Clinic: [facility]  Primary Care Provider: Georganna Skeans  Referring Provider: Georganna Skeans, MD  Chief Complaint: AUB, October 2024 abnormal CT  History of Present Illness: Kim [name] is a 34 y.o. [id number] (Patient's last menstrual period was 03/13/2024 (approximate).), seen for the above chief complaint. Her past medical history is significant for h/o c-section x 4 with BTL, h/o opiate addiction, tobacco abuse, asthma.  Patient seen late October 2024 for abdominal pain and diagnosed with appendicitis and underwent a l/s appy. CT showed possible pelvic congestion based on prominent vasculature; October 2023 CT wnl. Patient here for follow up for this.  She also states she's had AUB for the past six months along with increased cramping. Prior to this, her periods were qmonth and regular. She'll have two periods a month that can be 3 days to a week.   Review of Systems: Pertinent items are noted in HPI.   Patient Active Problem List   Diagnosis Date Noted   Abnormal CT scan, pelvis 03/23/2024   Vitamin D deficiency 03/25/2023   Biliary colic 11/05/2022   Dyspareunia in female 04/24/2022   Abnormal uterine bleeding 04/24/2022   Anxiety and depression 04/24/2022   Psychophysiological insomnia 04/24/2022   MDD (major depressive disorder), recurrent severe, without psychosis (HCC) 07/08/2018   Suicide attempt (HCC) 07/08/2018   Asthma, mild 07/07/2018   Cannabis abuse 07/07/2018   Asthma, moderate persistent, well-controlled 12/14/2014   Tobacco smoking affecting pregnancy, antepartum 12/14/2014   Bipolar depression (HCC) 12/14/2014   History of diabetes as a child and gestational diabetes 11/16/2014   Underweight 11/16/2014   Past Medical History:  Past Medical History:  Diagnosis Date   Acute appendicitis 10/22/2023   Anemia    Anxiety    Asthma     Chest pain    Depression    Fibromyalgia    Gestational diabetes 2010, 2012   H/O pre-eclampsia in prior pregnancy, currently pregnant 12/14/2014   Baby ASA--baseline labs--     Heartburn during pregnancy    Ileus, postoperative (HCC) 09/10/2016   Rectal bleeding    Seizures (HCC) 2015   x 1- unknown reason- passed out   Ulcer     Past Surgical History:  Past Surgical History:  Procedure Laterality Date   CESAREAN SECTION     x 2   CESAREAN SECTION WITH BILATERAL TUBAL LIGATION Bilateral 07/03/2015   Procedure: REPEAT CESAREAN SECTION ;  Surgeon: Tereso Newcomer, MD;  Location: [facility];  Service: Obstetrics;  Laterality: Bilateral;   CESAREAN SECTION WITH BILATERAL TUBAL LIGATION Bilateral 09/05/2016   Procedure: REPEAT CESAREAN SECTION WITH BILATERAL TUBAL LIGATION;  Surgeon: [place] Bing, MD;  [facility]: [facility];  Service: Obstetrics;  Laterality: Bilateral;   CHOLECYSTECTOMY     COLONOSCOPY  04/16/2008   LAPAROSCOPIC APPENDECTOMY N/A 10/22/2023   Procedure: APPENDECTOMY LAPAROSCOPIC;  Surgeon: Stechschulte, Hyman Hopes, MD;  Location: [facility];  Service: General;  Laterality: N/A;   WISDOM TOOTH EXTRACTION      Past Obstetrical History:  OB History  Gravida Para Term Preterm AB Living  5 4 4  1 4   SAB IAB Ectopic Multiple Live Births  1   0 4    # Outcome Date GA Lbr Len/2nd Weight Sex Type Anes PTL Lv  5 Term 09/05/16 [redacted]w[redacted]d  6 lb 14.2 oz (3.125 kg) F CS-LTranv Spinal  LIV  4 Term 07/03/15 [redacted]w[redacted]d  6  lb 14 oz (3.118 kg) M CS-LTranv Spinal  LIV     Birth Comments: Hgb, Normal, FA Newborn Screen Barcode: [id number] Date Collected: 07/04/2015  3 Term 02/04/11    F CS-LTranv   LIV     Complications: Pre-eclampsia  2 Term 01/12/09    M CS-LTranv EPI N LIV     Complications: Fetal Intolerance  1 SAB            Past Gynecological History: As per HPI. History of Pap Smear(s): Yes.   Last pap 01/2024, which was cytology negative  Social History:  Social History    Socioeconomic History   Marital status: Single    Spouse name: Not on file   Number of children: Not on file   Years of education: Not on file   Highest education level: Some college, no degree  Occupational History   Not on file  Tobacco Use   Smoking status: Every Day    Current packs/day: 0.50    Types: Cigarettes    Passive exposure: Current   Smokeless tobacco: Former    Quit date: 08/24/2014  Vaping Use   Vaping status: Never Used  Substance and Sexual Activity   Alcohol use: No   Drug use: Yes    Types: Marijuana   Sexual activity: Yes    Birth control/protection: None  Other Topics Concern   Not on file  Social History Narrative   Not on file   Social Drivers of Health   Financial Resource Strain: Low Risk  (01/28/2024)   Overall Financial Resource Strain (CARDIA)    Difficulty of Paying Living Expenses: Not very hard  Food Insecurity: No Food Insecurity (01/28/2024)   Hunger Vital Sign    Worried About Running Out of Food in the Last Year: Never true    Ran Out of Food in the Last Year: Never true  Transportation Needs: No Transportation Needs (01/28/2024)   PRAPARE - Administrator, Civil Service (Medical): No    Lack of Transportation (Non-Medical): No  Physical Activity: Sufficiently Active (01/28/2024)   Exercise Vital Sign    Days of Exercise per Week: 4 days    Minutes of Exercise per Session: 100 min  Stress: Stress Concern Present (01/28/2024)   [employer] of Occupational Health - Occupational Stress Questionnaire    Feeling of Stress : To some extent  Social Connections: Moderately Isolated (01/28/2024)   Social Connection and Isolation Panel [NHANES]    Frequency of Communication with Friends and Family: Three times a week    Frequency of Social Gatherings with Friends and Family: Once a week    Attends Religious Services: Never    Database administrator or Organizations: No    Attends Engineer, structural: Not on file     Marital Status: Living with partner  Intimate Partner Violence: Not At Risk (10/22/2023)   Humiliation, Afraid, Rape, and Kick questionnaire    [name] of Current or Ex-Partner: No    Emotionally Abused: No    Physically Abused: No    Sexually Abused: No    Family History:  Family History  Problem Relation Age of Onset   Depression Mother    Fibromyalgia Mother    Cancer Mother        breast   Ovarian cancer Mother    Diabetes Maternal Grandmother    Hypertension Maternal Grandmother    Arthritis Maternal Grandmother    Thyroid disease Maternal Grandmother    Colon  polyps Maternal Grandmother    Irritable bowel syndrome Maternal Grandmother     Medications [facility] "[facility]" had no medications administered during this visit. Current Outpatient Medications  Medication Sig Dispense Refill   albuterol (VENTOLIN HFA) 108 (90 Base) MCG/ACT inhaler Inhale 2 puffs into the lungs every 6 (six) hours as needed for wheezing or shortness of breath. 8 g 0   Spacer/Aero-Holding Chambers DEVI 1 Units by Does not apply route in the morning, at noon, and at bedtime. 1 Units 0   No current facility-administered medications for this visit.   Patient states she's on a medication for her h/o addiciton that starts with H but is unsure of what it's called; pt to send mychart picture of it  Allergies Patient has no known allergies.  Physical Exam:  BP 107/70   Pulse 85   Ht 5\' 8"  (1.727 m)   Wt 107 lb (48.5 kg)   LMP 03/13/2024 (Approximate)   BMI 16.27 kg/m  Body mass index is 16.27 kg/m. General appearance: Well nourished, well developed female in no acute distress.  Neck: normal appearance Respiratory Normal respiratory effort Neuro/Psych:  Normal mood and affect.   Laboratory: 1/29 lipid panel, cmp, cbc, pap cytology, GC/CT negative.   Radiology: as per HPI Narrative & Impression  CLINICAL DATA:  Right lower quadrant abdominal pain.   EXAM: CT ABDOMEN AND PELVIS WITH  CONTRAST   TECHNIQUE: Multidetector CT imaging of the abdomen and pelvis was performed using the standard protocol following bolus administration of intravenous contrast.   RADIATION DOSE REDUCTION: This exam was performed according to the departmental dose-optimization program which includes automated exposure control, adjustment of the mA and/or kV according to patient size and/or use of iterative reconstruction technique.   CONTRAST:  75mL OMNIPAQUE IOHEXOL 350 MG/ML SOLN   COMPARISON:  CT 10/04/2022   FINDINGS: Lower chest: Clear lung bases.   Hepatobiliary: Small cyst in the left lobe of the liver. No suspicious liver lesion. Clips in the gallbladder fossa postcholecystectomy. No biliary dilatation.   Pancreas: No ductal dilatation or inflammation.   Spleen: Normal in size without focal abnormality.   Adrenals/Urinary Tract: No adrenal nodule. Early excretion of IV contrast in the renal collecting systems. No hydronephrosis or renal inflammation. Urinary bladder is unremarkable.   Stomach/Bowel: The midportion of the appendix is mildly dilated at 7 mm, series 6, image 47 and series 3, image 58. There may be mild appendiceal wall thickening and trace fluid in the mid appendix. Findings may represent early appendicitis. Occasional fluid-filled loops of small bowel in the pelvis. No obstruction. Unremarkable appearance of the stomach. Small volume of stool in the colon.   Vascular/Lymphatic: No acute vascular findings. The abdominal aorta is normal in caliber. The portal vein is patent. There is prominent periuterine and adnexal vascularity with dilatation of the ovarian veins, 7 mm on the left and 6 mm on the right. No abdominopelvic adenopathy.   Reproductive: Prominent periuterine and adnexal vascularity. Anteverted uterus. No obvious adnexal mass.   Other: No free air or ascites. No abdominopelvic collection. No abdominal wall hernia.   Musculoskeletal: Mild  scoliosis. There are no acute or suspicious osseous abnormalities.   IMPRESSION: 1. Mild dilatation of the mid appendix with possible mild wall thickening and trace intraluminal fluid. Findings may represent early appendicitis. 2. Prominent periuterine and adnexal vascularity with dilatation of the ovarian veins. This can be seen with pelvic congestion syndrome in the appropriate clinical setting.     Electronically  Signed   By: Narda Rutherford M.D.   On: 10/22/2023 19:15   Assessment: patient stable.   Plan:  1. Abnormal uterine bleeding (Primary) Follow up basic lab and early cycle u/s work up. D/w her re: need for exam and likely endometrial biopsy next visit.  - US PELVIC COMPLETE WITH TRANSVAGINAL; Future - TSH Rfx on Abnormal to Free T4 - Prolactin  2. Abnormal CT scan, pelvis As stated above, CT one year prior to appendicitis episode was wnl so findings could be due to acute infection. Follow up u/s.   Orders Placed This Encounter  Procedures   US PELVIC COMPLETE WITH TRANSVAGINAL   TSH Rfx on Abnormal to Free T4   Prolactin    Future Appointments  Date Time Provider Department Center  04/13/2024  3:30 PM [facility]  04/20/2024  1:50 PM [place] Bing, MD [facility]  01/28/2025 10:20 AM Georganna Skeans, MD [facility] None    Cornelia Copa MD Attending [facility])

## 2024-03-23 NOTE — Progress Notes (Unsigned)
 New GYN   Last pap: 01/29/24  CC: Heavy periods/irregular will have 2 cycles in a month. Notes Stomach pains below the belly button/ had MRI pt was told by a Dr. that she has a (bubble on her cervix) pt sometimes she will have sharp pain in her vaginal area that is sudden/brief will take breath away.

## 2024-03-24 ENCOUNTER — Encounter: Payer: Self-pay | Admitting: Obstetrics and Gynecology

## 2024-03-24 LAB — TSH RFX ON ABNORMAL TO FREE T4: TSH: 1.51 u[IU]/mL (ref 0.450–4.500)

## 2024-03-24 LAB — PROLACTIN: Prolactin: 4.8 ng/mL (ref 4.8–33.4)

## 2024-04-05 ENCOUNTER — Encounter: Payer: Self-pay | Admitting: Obstetrics and Gynecology

## 2024-04-07 ENCOUNTER — Ambulatory Visit
Admission: RE | Admit: 2024-04-07 | Discharge: 2024-04-07 | Disposition: A | Discharge status: Home / Self Care | Source: Ambulatory Visit | Attending: Obstetrics and Gynecology | Admitting: Obstetrics and Gynecology

## 2024-04-07 DIAGNOSIS — N939 Abnormal uterine and vaginal bleeding, unspecified: Secondary | ICD-10-CM | POA: Insufficient documentation

## 2024-04-13 ENCOUNTER — Ambulatory Visit

## 2024-04-14 ENCOUNTER — Encounter: Payer: Self-pay | Admitting: Obstetrics and Gynecology

## 2024-04-20 ENCOUNTER — Ambulatory Visit: Admitting: Obstetrics and Gynecology

## 2024-04-20 VITALS — BP 115/71 | HR 91 | Wt 106.0 lb

## 2024-04-20 DIAGNOSIS — K089 Disorder of teeth and supporting structures, unspecified: Secondary | ICD-10-CM | POA: Diagnosis not present

## 2024-04-20 DIAGNOSIS — N939 Abnormal uterine and vaginal bleeding, unspecified: Secondary | ICD-10-CM

## 2024-04-20 DIAGNOSIS — R55 Syncope and collapse: Secondary | ICD-10-CM | POA: Diagnosis not present

## 2024-04-20 DIAGNOSIS — N882 Stricture and stenosis of cervix uteri: Secondary | ICD-10-CM

## 2024-04-20 NOTE — Progress Notes (Signed)
 RGYN here for F/U on U/S results.

## 2024-04-20 NOTE — Progress Notes (Signed)
 Obstetrics and Gynecology Established Patient Evaluation  Appointment Date: 04/20/2024  OBGYN Clinic: Center for [facility]  Primary Care Provider: Abraham Abo  Referring Provider: Abraham Abo, MD  Chief Complaint: follow up u/s  History of Present Illness: Kim [name] is a 34 y.o. [id number] (Patient's last menstrual period was 03/13/2024 (approximate).), seen for the above chief complaint. Her past medical history is significant for h/o c-section x 4 with BTL, h/o opiate addiction, tobacco abuse, asthma.  Patient initially seen on 3/24 after referral for AUB and abnormal imaging.   Patient seen late October 2024 for abdominal pain and diagnosed with appendicitis and underwent a l/s appy. CT showed possible pelvic congestion based on prominent vasculature; October 2023 CT wnl. Patient here for follow up for this.  She also states she's had AUB for the past six months along with increased cramping. Prior to this, her periods were qmonth and regular. She'll have two periods a month that can be 3 days to a week. Pap and STI swab testing negative earlier this year.   Plan at that was u/s and follow up. U/s on 4/11 showed no e/o congestion syndrome with a 6x2x24mm endometrial cyst.   Interval History: since her last visit, patient notes intermittent low pelvic cramping and no more AUB and she has had a period that was 4 days earlier this month.   Review of Systems: Pertinent items are noted in HPI.   Patient Active Problem List   Diagnosis Date Noted   Abnormal CT scan, pelvis 03/23/2024   History of opiate therapy 03/23/2024   Vitamin D  deficiency 03/25/2023   Biliary colic 11/05/2022   Dyspareunia in female 04/24/2022   Abnormal uterine bleeding 04/24/2022   Anxiety and depression 04/24/2022   Psychophysiological insomnia 04/24/2022   MDD (major depressive disorder), recurrent severe, without psychosis (HCC) 07/08/2018   Suicide attempt (HCC) 07/08/2018    Asthma, mild 07/07/2018   Cannabis abuse 07/07/2018   Asthma, moderate persistent, well-controlled 12/14/2014   Tobacco smoking affecting pregnancy, antepartum 12/14/2014   Bipolar depression (HCC) 12/14/2014   History of diabetes as a child and gestational diabetes 11/16/2014   Underweight 11/16/2014   Past Medical History:  Past Medical History:  Diagnosis Date   Acute appendicitis 10/22/2023   Anemia    Anxiety    Asthma    Chest pain    Depression    Fibromyalgia    Gestational diabetes 2010, 2012   H/O pre-eclampsia in prior pregnancy, currently pregnant 12/14/2014   Baby ASA--baseline labs--     Heartburn during pregnancy    Ileus, postoperative (HCC) 09/10/2016   Rectal bleeding    Seizures (HCC) 2015   x 1- unknown reason- passed out   Ulcer     Past Surgical History:  Past Surgical History:  Procedure Laterality Date   CESAREAN SECTION     x 2   CESAREAN SECTION WITH BILATERAL TUBAL LIGATION Bilateral 07/03/2015   Procedure: REPEAT CESAREAN SECTION ;  Surgeon: Julianne Octave, MD;  Location: [facility];  Service: Obstetrics;  Laterality: Bilateral;   CESAREAN SECTION WITH BILATERAL TUBAL LIGATION Bilateral 09/05/2016   Procedure: REPEAT CESAREAN SECTION WITH BILATERAL TUBAL LIGATION;  Surgeon: Raynell Caller, MD;  Location: [facility];  Service: Obstetrics;  Laterality: Bilateral;   CHOLECYSTECTOMY     COLONOSCOPY  04/16/2008   LAPAROSCOPIC APPENDECTOMY N/A 10/22/2023   Procedure: APPENDECTOMY LAPAROSCOPIC;  Surgeon: Junie Olds, MD;  Location: [facility];  Service: General;  Laterality: N/A;  WISDOM TOOTH EXTRACTION      Past Obstetrical History:  OB History  Gravida Para Term Preterm AB Living  5 4 4  1 4   SAB IAB Ectopic Multiple Live Births  1   0 4    # Outcome Date GA Lbr Len/2nd Weight Sex Type Anes PTL Lv  5 Term 09/05/16 [redacted]w[redacted]d  6 lb 14.2 oz (3.125 kg) F CS-LTranv Spinal  LIV  4 Term 07/03/15 [redacted]w[redacted]d  6 lb 14 oz (3.118 kg) M CS-LTranv  Spinal  LIV     Birth Comments: Hgb, Normal, FA Newborn Screen Barcode: [id number] Date Collected: 07/04/2015  3 Term 02/04/11    F CS-LTranv   LIV     Complications: Pre-eclampsia  2 Term 01/12/09    M CS-LTranv EPI N LIV     Complications: Fetal Intolerance  1 SAB            Past Gynecological History: As per HPI. History of Pap Smear(s): Yes.   Last pap 01/2024, which was cytology negative  Social History:  Social History   Socioeconomic History   Marital status: Single    Spouse name: Not on file   Number of children: Not on file   Years of education: Not on file   Highest education level: Some college, no degree  Occupational History   Not on file  Tobacco Use   Smoking status: Every Day    Current packs/day: 0.50    Types: Cigarettes    Passive exposure: Current   Smokeless tobacco: Former    Quit date: 08/24/2014  Vaping Use   Vaping status: Never Used  Substance and Sexual Activity   Alcohol use: No   Drug use: Yes    Types: Marijuana   Sexual activity: Yes    Birth control/protection: None  Other Topics Concern   Not on file  Social History Narrative   Not on file   Social Drivers of Health   Financial Resource Strain: Low Risk  (01/28/2024)   Overall Financial Resource Strain (CARDIA)    Difficulty of Paying Living Expenses: Not very hard  Food Insecurity: No Food Insecurity (01/28/2024)   Hunger Vital Sign    Worried About Running Out of Food in the Last Year: Never true    Ran Out of Food in the Last Year: Never true  Transportation Needs: No Transportation Needs (01/28/2024)   PRAPARE - Administrator, Civil Service (Medical): No    Lack of Transportation (Non-Medical): No  Physical Activity: Sufficiently Active (01/28/2024)   Exercise Vital Sign    Days of Exercise per Week: 4 days    Minutes of Exercise per Session: 100 min  Stress: Stress Concern Present (01/28/2024)   [employer] of Occupational Health - Occupational Stress  Questionnaire    Feeling of Stress : To some extent  Social Connections: Moderately Isolated (01/28/2024)   Social Connection and Isolation Panel [NHANES]    Frequency of Communication with Friends and Family: Three times a week    Frequency of Social Gatherings with Friends and Family: Once a week    Attends Religious Services: Never    Database administrator or Organizations: No    Attends Engineer, structural: Not on file    Marital Status: Living with partner  Intimate Partner Violence: Not At Risk (10/22/2023)   Humiliation, Afraid, Rape, and Kick questionnaire    Fear of Current or Ex-Partner: No    Emotionally Abused: No  Physically Abused: No    Sexually Abused: No    Family History:  Family History  Problem Relation Age of Onset   Depression Mother    Fibromyalgia Mother    Cancer Mother        breast   Ovarian cancer Mother    Diabetes Maternal Grandmother    Hypertension Maternal Grandmother    Arthritis Maternal Grandmother    Thyroid disease Maternal Grandmother    Colon polyps Maternal Grandmother    Irritable bowel syndrome Maternal Grandmother     Medications Mekhia R. Battle "Mansfield Seip" had no medications administered during this visit. Current Outpatient Medications  Medication Sig Dispense Refill   albuterol  (VENTOLIN  HFA) 108 (90 Base) MCG/ACT inhaler Inhale 2 puffs into the lungs every 6 (six) hours as needed for wheezing or shortness of breath. 8 g 0   Spacer/Aero-Holding Chambers DEVI 1 Units by Does not apply route in the morning, at noon, and at bedtime. 1 Units 0   No current facility-administered medications for this visit.   Patient states she's on a medication for her h/o addiciton that starts with H but is unsure of what it's called; pt to send mychart picture of it  Allergies Patient has no known allergies.  Physical Exam:  BP 115/71   Pulse 91   Wt 106 lb (48.1 kg)   LMP 03/13/2024 (Approximate)   BMI 16.12 kg/m  Body mass  index is 16.12 kg/m. General appearance: Well nourished, well developed female in no acute distress.  Neck: normal appearance Respiratory Normal respiratory effort Neuro/Psych:  Normal mood and affect.   EMBx attempt but patient had tight cervical stenosis at the internal os (see procedure note for details) and patient had ?vagal response. Procedure aborted, no sampling done  Laboratory: 1/29 lipid panel, cmp, cbc, pap cytology, GC/CT negative.   Radiology: as per HPI Narrative & Impression  CLINICAL DATA:  Right lower quadrant abdominal pain.   EXAM: CT ABDOMEN AND PELVIS WITH CONTRAST   TECHNIQUE: Multidetector CT imaging of the abdomen and pelvis was performed using the standard protocol following bolus administration of intravenous contrast.   RADIATION DOSE REDUCTION: This exam was performed according to the departmental dose-optimization program which includes automated exposure control, adjustment of the mA and/or kV according to patient size and/or use of iterative reconstruction technique.   CONTRAST:  75mL OMNIPAQUE  IOHEXOL  350 MG/ML SOLN   COMPARISON:  CT 10/04/2022   FINDINGS: Lower chest: Clear lung bases.   Hepatobiliary: Small cyst in the left lobe of the liver. No suspicious liver lesion. Clips in the gallbladder fossa postcholecystectomy. No biliary dilatation.   Pancreas: No ductal dilatation or inflammation.   Spleen: Normal in size without focal abnormality.   Adrenals/Urinary Tract: No adrenal nodule. Early excretion of IV contrast in the renal collecting systems. No hydronephrosis or renal inflammation. Urinary bladder is unremarkable.   Stomach/Bowel: The midportion of the appendix is mildly dilated at 7 mm, series 6, image 47 and series 3, image 58. There may be mild appendiceal wall thickening and trace fluid in the mid appendix. Findings may represent early appendicitis. Occasional fluid-filled loops of small bowel in the pelvis. No  obstruction. Unremarkable appearance of the stomach. Small volume of stool in the colon.   Vascular/Lymphatic: No acute vascular findings. The abdominal aorta is normal in caliber. The portal vein is patent. There is prominent periuterine and adnexal vascularity with dilatation of the ovarian veins, 7 mm on the left and 6 mm on the  right. No abdominopelvic adenopathy.   Reproductive: Prominent periuterine and adnexal vascularity. Anteverted uterus. No obvious adnexal mass.   Other: No free air or ascites. No abdominopelvic collection. No abdominal wall hernia.   Musculoskeletal: Mild scoliosis. There are no acute or suspicious osseous abnormalities.   IMPRESSION: 1. Mild dilatation of the mid appendix with possible mild wall thickening and trace intraluminal fluid. Findings may represent early appendicitis. 2. Prominent periuterine and adnexal vascularity with dilatation of the ovarian veins. This can be seen with pelvic congestion syndrome in the appropriate clinical setting.     Electronically Signed   By: Chadwick Colonel M.D.   On: 10/22/2023 19:15   Assessment: patient stable.   Plan:  1. Abnormal uterine bleeding (Primary) Repeat appt made with patient to d/w her re: next steps.   2. Abnormal CT scan, pelvis 03/2024 w/o overt pelvic congestion syndrome but there are cystic vascular areas that I see on the scan. See above.   Future Appointments  Date Time Provider Department Center  04/20/2024  1:50 PM Raynell Caller, MD [facility]  01/28/2025 10:20 AM Abraham Abo, MD [facility] None    Tyler Gallant MD Attending Center for [facility])

## 2024-04-27 DIAGNOSIS — N882 Stricture and stenosis of cervix uteri: Secondary | ICD-10-CM

## 2024-04-27 DIAGNOSIS — K089 Disorder of teeth and supporting structures, unspecified: Secondary | ICD-10-CM | POA: Insufficient documentation

## 2024-04-27 HISTORY — DX: Stricture and stenosis of cervix uteri: N88.2

## 2024-04-27 NOTE — Procedures (Signed)
 Endometrial Biopsy Procedure (Attempt) Note  Pre-operative Diagnosis: AUB  Post-operative Diagnosis: same. Cervical stenosis at the internal os.   Procedure Details  Cervical exam performed in the presence of a chaperone Urine pregnancy test was not done.  The risks (including infection, bleeding, pain, and uterine perforation) and benefits of the procedure were explained to the patient and Written informed consent was obtained.  The patient was placed in the dorsal lithotomy position.  Bimanual exam showed the uterus to be in the neutral position.  A Graves' speculum inserted in the vagina, and the cervix was visualized. The cervix was then prepped with povidone iodine, and a sharp tenaculum was applied to the anterior lip of the cervix for stabilization.  A pipelle was inserted into the cervical canal to a depth of 3.5cm. I tried to dilate with yellow os finders but was unsuccessful and patient started to feel lightheaded so the procedure was stopped. She did very well with my attempt. No sample collected  Condition: Stable  Complications: None  Plan: Follow up appointment made to go over next steps  Tyler Gallant MD Attending Center for [facility])

## 2024-05-11 ENCOUNTER — Telehealth: Admitting: Obstetrics and Gynecology

## 2024-05-11 DIAGNOSIS — R102 Pelvic and perineal pain: Secondary | ICD-10-CM | POA: Diagnosis not present

## 2024-05-11 DIAGNOSIS — Z8742 Personal history of other diseases of the female genital tract: Secondary | ICD-10-CM

## 2024-05-11 DIAGNOSIS — R9389 Abnormal findings on diagnostic imaging of other specified body structures: Secondary | ICD-10-CM

## 2024-05-11 NOTE — Progress Notes (Signed)
 TELEHEALTH GYNECOLOGY VISIT ENCOUNTER NOTE Established Patient Evaluation  Appointment Date: 05/11/2024  OBGYN Clinic: Center for [facility]  Patient location: Home  Primary Care Provider: Abraham Abo  Referring Provider: Abraham Abo, MD  I connected with [name] on 05/11/24 at  3:50 PM EDT by telephone and verified that I am speaking with the correct person using two identifiers. Patient was unable to do MyChart audiovisual encounter due to technical difficulties, she tried several times.    I discussed the limitations, risks, security and privacy concerns of performing an evaluation and management service by telephone and the availability of in person appointments. I also discussed with the patient that there may be a patient responsible charge related to this service. The patient expressed understanding and agreed to proceed.  Chief Complaint: follow up re: cramping and h/o AUB  History of Present Illness: Kim [name] is a 34 y.o.  [id number] (early May 2025), seen for the above chief complaint. Her past medical history is significant for h/o c-section x 4 with BTL, cervical stenosis, h/o opiate addiction, tobacco abuse, asthma.   Patient initially seen on 3/24 after referral for AUB and abnormal imaging.    Patient seen late October 2024 for abdominal pain and diagnosed with appendicitis and underwent a l/s appy. CT showed possible pelvic congestion based on prominent vasculature; October 2023 CT wnl. Patient here for follow up for this.   She also stated  she's had AUB for the past six months along with increased cramping. Prior to this, her periods were qmonth and regular. She'll have two periods a month that can be 3 days to a week. Pap and STI swab testing negative earlier this year.    Plan at that time was u/s and follow up. U/s on 4/11 showed no e/o congestion syndrome with a 6x2x58mm endometrial cyst at the LUS/area of the c/s incision with no  overt vascularity noted. At her 4/21 f/u, EMBx attempt done with severe cervical stenosis noted and unable to enter into the uterine cavity and patient got vagal.    Interval History: Patient visit today for follow up for next steps. Patient still notes pelvic cramps that last for about 15-42m and occur at least 4x/week, no GI s/s and no AUB. She had a short period from 5/1 to 5/2.   Review of Systems: Pertinent items are noted in HPI.   Patient Active Problem List   Diagnosis Date Noted   Cervical stenosis (uterine cervix) 04/27/2024   Poor dentition 04/27/2024   Abnormal CT scan, pelvis 03/23/2024   History of opiate therapy 03/23/2024   Vitamin D  deficiency 03/25/2023   Biliary colic 11/05/2022   Dyspareunia in female 04/24/2022   Abnormal uterine bleeding 04/24/2022   Anxiety and depression 04/24/2022   Psychophysiological insomnia 04/24/2022   MDD (major depressive disorder), recurrent severe, without psychosis (HCC) 07/08/2018   Suicide attempt (HCC) 07/08/2018   Asthma, mild 07/07/2018   Cannabis abuse 07/07/2018   Asthma, moderate persistent, well-controlled 12/14/2014   Tobacco smoking affecting pregnancy, antepartum 12/14/2014   Bipolar depression (HCC) 12/14/2014   History of diabetes as a child and gestational diabetes 11/16/2014   Underweight 11/16/2014    Past Medical History:  Past Medical History:  Diagnosis Date   Acute appendicitis 10/22/2023   Anemia    Anxiety    Asthma    Chest pain    Depression    Fibromyalgia    Gestational diabetes 2010, 2012   H/O pre-eclampsia in  prior pregnancy, currently pregnant 12/14/2014   Baby ASA--baseline labs--     Heartburn during pregnancy    Ileus, postoperative (HCC) 09/10/2016   Rectal bleeding    Seizures (HCC) 2015   x 1- unknown reason- passed out   Ulcer     Past Surgical History:  Past Surgical History:  Procedure Laterality Date   CESAREAN SECTION     x 2   CESAREAN SECTION WITH BILATERAL TUBAL  LIGATION Bilateral 07/03/2015   Procedure: REPEAT CESAREAN SECTION ;  Surgeon: Julianne Octave, MD;  Location: [facility];  Service: Obstetrics;  Laterality: Bilateral;   CESAREAN SECTION WITH BILATERAL TUBAL LIGATION Bilateral 09/05/2016   Procedure: REPEAT CESAREAN SECTION WITH BILATERAL TUBAL LIGATION;  Surgeon: Raynell Caller, MD;  [facility];  Service: Obstetrics;  Laterality: Bilateral;   CHOLECYSTECTOMY     COLONOSCOPY  04/16/2008   LAPAROSCOPIC APPENDECTOMY N/A 10/22/2023   Procedure: APPENDECTOMY LAPAROSCOPIC;  Surgeon: Stechschulte, Avon Boers, MD;  Location: [facility];  Service: General;  Laterality: N/A;   WISDOM TOOTH EXTRACTION      Past Obstetrical History:  OB History  Gravida Para Term Preterm AB Living  5 4 4  1 4   SAB IAB Ectopic Multiple Live Births  1   0 4    # Outcome Date GA Lbr Len/2nd Weight Sex Type Anes PTL Lv  5 Term 09/05/16 [redacted]w[redacted]d  6 lb 14.2 oz (3.125 kg) F CS-LTranv Spinal  LIV  4 Term 07/03/15 [redacted]w[redacted]d  6 lb 14 oz (3.118 kg) M CS-LTranv Spinal  LIV     Birth Comments: Hgb, Normal, FA Newborn Screen Barcode: [id number] Date Collected: 07/04/2015  3 Term 02/04/11    F CS-LTranv   LIV     Complications: Pre-eclampsia  2 Term 01/12/09    M CS-LTranv EPI N LIV     Complications: Fetal Intolerance  1 SAB            Past Gynecological History: As per HPI. History of Pap Smear(s): Yes.   Last pap 01/2024, which was negative cytology, no hpv done  Social History:  Social History   Socioeconomic History   Marital status: Single    Spouse name: Not on file   Number of children: Not on file   Years of education: Not on file   Highest education level: Some college, no degree  Occupational History   Not on file  Tobacco Use   Smoking status: Every Day    Current packs/day: 0.50    Types: Cigarettes    Passive exposure: Current   Smokeless tobacco: Former    Quit date: 08/24/2014  Vaping Use   Vaping status: Never Used  Substance and Sexual  Activity   Alcohol use: No   Drug use: Yes    Types: Marijuana   Sexual activity: Yes    Birth control/protection: None  Other Topics Concern   Not on file  Social History Narrative   Not on file   Social Drivers of Health   Financial Resource Strain: Low Risk  (01/28/2024)   Overall Financial Resource Strain (CARDIA)    Difficulty of Paying Living Expenses: Not very hard  Food Insecurity: No Food Insecurity (01/28/2024)   Hunger Vital Sign    Worried About Running Out of Food in the Last Year: Never true    Ran Out of Food in the Last Year: Never true  Transportation Needs: No Transportation Needs (01/28/2024)   PRAPARE - Transportation    Lack  of Transportation (Medical): No    Lack of Transportation (Non-Medical): No  Physical Activity: Sufficiently Active (01/28/2024)   Exercise Vital Sign    Days of Exercise per Week: 4 days    Minutes of Exercise per Session: 100 min  Stress: Stress Concern Present (01/28/2024)   [employer] of Occupational Health - Occupational Stress Questionnaire    Feeling of Stress : To some extent  Social Connections: Moderately Isolated (01/28/2024)   Social Connection and Isolation Panel [NHANES]    Frequency of Communication with Friends and Family: Three times a week    Frequency of Social Gatherings with Friends and Family: Once a week    Attends Religious Services: Never    Database administrator or Organizations: No    Attends Engineer, structural: Not on file    Marital Status: Living with partner  Intimate Partner Violence: Not At Risk (10/22/2023)   Humiliation, Afraid, Rape, and Kick questionnaire    Fear of Current or Ex-Partner: No    Emotionally Abused: No    Physically Abused: No    Sexually Abused: No    Family History:  Family History  Problem Relation Age of Onset   Depression Mother    Fibromyalgia Mother    Cancer Mother        breast   Ovarian cancer Mother    Diabetes Maternal Grandmother     Hypertension Maternal Grandmother    Arthritis Maternal Grandmother    Thyroid disease Maternal Grandmother    Colon polyps Maternal Grandmother    Irritable bowel syndrome Maternal Grandmother    Medications [employer] "[place] Seip" had no medications administered during this visit. Current Outpatient Medications  Medication Sig Dispense Refill   albuterol  (VENTOLIN  HFA) 108 (90 Base) MCG/ACT inhaler Inhale 2 puffs into the lungs every 6 (six) hours as needed for wheezing or shortness of breath. 8 g 0   Spacer/Aero-Holding Chambers DEVI 1 Units by Does not apply route in the morning, at noon, and at bedtime. 1 Units 0   No current facility-administered medications for this visit.    Allergies Patient has no known allergies.   Physical Exam:  General:  Alert, oriented and cooperative.   Mental Status: Normal mood and affect perceived. Normal judgment and thought content.  Physical exam deferred due to nature of the encounter  Laboratory: as per HPI  Radiology: as per HPI  Assessment: patient stable  Plan:  I told her I recommend evaluation with hysteroscopy of the lower uterine segment intrauterine cavity abnormality, and given the stenosis, this would need to be done in the OR. I told her the abnormalitiy could be causing her AUB and/or the stenosis; at the time of the u/s there was dark, homogenous fluid at the fundus and if this is blood, it may be having a hard time coming up due to the stenosis.   I discussed the assessment and treatment plan with the patient. The patient was provided an opportunity to ask questions and all were answered. The patient agreed with the plan and demonstrated an understanding of the instructions.   The patient was advised to call back or seek an in-person evaluation/go to the ED if the symptoms worsen or if the condition fails to improve as anticipated.  I provided 20 minutes of non-face-to-face time during this encounter.  Tyler Gallant MD Attending Center for [employer])

## 2024-05-12 ENCOUNTER — Other Ambulatory Visit

## 2024-05-13 ENCOUNTER — Telehealth: Payer: Self-pay

## 2024-05-13 NOTE — Telephone Encounter (Signed)
 I called patient to schedule surgery with Dr. Aquilla Knapp on 07/28/24 at [facility] @1  pm. Patient confirmed she's available and would like to be scheduled. I provided pre-op instructions and surgery details by phone. Written details will be sent to patient's Mychart acct.

## 2024-05-18 ENCOUNTER — Other Ambulatory Visit

## 2024-05-18 DIAGNOSIS — R300 Dysuria: Secondary | ICD-10-CM | POA: Diagnosis not present

## 2024-05-18 LAB — POCT URINALYSIS DIPSTICK

## 2024-05-18 NOTE — Progress Notes (Unsigned)
 No Note needed. Pt only needed dip.

## 2024-07-21 ENCOUNTER — Other Ambulatory Visit: Payer: Self-pay | Admitting: Obstetrics and Gynecology

## 2024-07-21 DIAGNOSIS — Z01818 Encounter for other preprocedural examination: Secondary | ICD-10-CM

## 2024-07-27 ENCOUNTER — Other Ambulatory Visit: Payer: Self-pay

## 2024-07-27 ENCOUNTER — Encounter (HOSPITAL_COMMUNITY): Payer: Self-pay | Admitting: Obstetrics and Gynecology

## 2024-07-27 NOTE — Progress Notes (Signed)
 SDW CALL  Patient was given pre-op instructions over the phone. The opportunity was given for the patient to ask questions. No further questions asked. Patient verbalized understanding of instructions given.   PCP - Tanda Bleacher, MD  Cardiologist -   PPM/ICD - denies Device Orders -  Rep Notified -   Chest x-ray - 01-25-23 EKG -  Stress Test - 07-07-2010 ECHO - 07-07-2010 Cardiac Cath -   Sleep Study - denies CPAP - denies  Dm- denies  Blood Thinner Instructions:denies Aspirin  Instructions:n/a  ERAS Protcol -clear liquids until NooN PRE-SURGERY Ensure or G2-   COVID TEST- n/a   Anesthesia review: no  Patient denies shortness of breath, fever, cough and chest pain over the phone call   All instructions explained to the patient, with a verbal understanding of the material. Patient agrees to go over the instructions while at home for a better understanding.

## 2024-07-28 ENCOUNTER — Encounter (HOSPITAL_COMMUNITY): Payer: Self-pay | Admitting: Obstetrics and Gynecology

## 2024-07-28 ENCOUNTER — Ambulatory Visit (HOSPITAL_COMMUNITY)
Admission: RE | Admit: 2024-07-28 | Discharge: 2024-07-28 | Disposition: A | Discharge status: Home / Self Care | Attending: Obstetrics and Gynecology | Admitting: Obstetrics and Gynecology

## 2024-07-28 ENCOUNTER — Ambulatory Visit (HOSPITAL_COMMUNITY): Admitting: Registered Nurse

## 2024-07-28 ENCOUNTER — Other Ambulatory Visit: Payer: Self-pay

## 2024-07-28 ENCOUNTER — Encounter (HOSPITAL_COMMUNITY)
Admission: RE | Disposition: A | Discharge status: Home / Self Care | Payer: Self-pay | Source: Home / Self Care | Attending: Obstetrics and Gynecology

## 2024-07-28 DIAGNOSIS — Y768 Miscellaneous obstetric and gynecological devices associated with adverse incidents, not elsewhere classified: Secondary | ICD-10-CM | POA: Insufficient documentation

## 2024-07-28 DIAGNOSIS — J45909 Unspecified asthma, uncomplicated: Secondary | ICD-10-CM | POA: Insufficient documentation

## 2024-07-28 DIAGNOSIS — Y658 Other specified misadventures during surgical and medical care: Secondary | ICD-10-CM | POA: Insufficient documentation

## 2024-07-28 DIAGNOSIS — M797 Fibromyalgia: Secondary | ICD-10-CM | POA: Diagnosis not present

## 2024-07-28 DIAGNOSIS — R9389 Abnormal findings on diagnostic imaging of other specified body structures: Secondary | ICD-10-CM | POA: Diagnosis present

## 2024-07-28 DIAGNOSIS — F1290 Cannabis use, unspecified, uncomplicated: Secondary | ICD-10-CM | POA: Diagnosis not present

## 2024-07-28 DIAGNOSIS — N938 Other specified abnormal uterine and vaginal bleeding: Secondary | ICD-10-CM | POA: Diagnosis present

## 2024-07-28 DIAGNOSIS — N882 Stricture and stenosis of cervix uteri: Secondary | ICD-10-CM | POA: Diagnosis not present

## 2024-07-28 DIAGNOSIS — N888 Other specified noninflammatory disorders of cervix uteri: Secondary | ICD-10-CM | POA: Diagnosis not present

## 2024-07-28 DIAGNOSIS — Z01818 Encounter for other preprocedural examination: Secondary | ICD-10-CM

## 2024-07-28 DIAGNOSIS — N939 Abnormal uterine and vaginal bleeding, unspecified: Secondary | ICD-10-CM | POA: Diagnosis not present

## 2024-07-28 DIAGNOSIS — Y92238 Other place in hospital as the place of occurrence of the external cause: Secondary | ICD-10-CM | POA: Insufficient documentation

## 2024-07-28 DIAGNOSIS — N9971 Accidental puncture and laceration of a genitourinary system organ or structure during a genitourinary system procedure: Secondary | ICD-10-CM | POA: Insufficient documentation

## 2024-07-28 DIAGNOSIS — F17200 Nicotine dependence, unspecified, uncomplicated: Secondary | ICD-10-CM | POA: Diagnosis not present

## 2024-07-28 DIAGNOSIS — R102 Pelvic and perineal pain: Secondary | ICD-10-CM | POA: Diagnosis present

## 2024-07-28 HISTORY — PX: HYSTEROSCOPY WITH D & C: SHX1775

## 2024-07-28 LAB — CBC
HCT: 32.7 % — ABNORMAL LOW (ref 36.0–46.0)
Hemoglobin: 10.6 g/dL — ABNORMAL LOW (ref 12.0–15.0)
MCH: 29.2 pg (ref 26.0–34.0)
MCHC: 32.4 g/dL (ref 30.0–36.0)
MCV: 90.1 fL (ref 80.0–100.0)
Platelets: 164 K/uL (ref 150–400)
RBC: 3.63 MIL/uL — ABNORMAL LOW (ref 3.87–5.11)
RDW: 14.2 % (ref 11.5–15.5)
WBC: 6.1 K/uL (ref 4.0–10.5)
nRBC: 0 % (ref 0.0–0.2)

## 2024-07-28 LAB — BASIC METABOLIC PANEL WITH GFR
Anion gap: 10 (ref 5–15)
BUN: 7 mg/dL (ref 6–20)
CO2: 21 mmol/L — ABNORMAL LOW (ref 22–32)
Calcium: 8.5 mg/dL — ABNORMAL LOW (ref 8.9–10.3)
Chloride: 105 mmol/L (ref 98–111)
Creatinine, Ser: 0.8 mg/dL (ref 0.44–1.00)
GFR, Estimated: >60 mL/min (ref >60)
Glucose, Bld: 96 mg/dL (ref 70–99)
Potassium: 3.4 mmol/L — ABNORMAL LOW (ref 3.5–5.1)
Sodium: 136 mmol/L (ref 135–145)

## 2024-07-28 LAB — POCT PREGNANCY, URINE: Preg Test, Ur: NEGATIVE

## 2024-07-28 SURGERY — DILATATION AND CURETTAGE /HYSTEROSCOPY
Anesthesia: General

## 2024-07-28 MED ORDER — PROPOFOL 10 MG/ML IV BOLUS
INTRAVENOUS | Status: AC
  Filled 2024-07-28: qty 20

## 2024-07-28 MED ORDER — EPHEDRINE 5 MG/ML INJ
INTRAVENOUS | Status: AC
  Filled 2024-07-28: qty 5

## 2024-07-28 MED ORDER — MIDAZOLAM HCL 2 MG/2ML IJ SOLN
INTRAMUSCULAR | Status: DC | PRN
  Administered 2024-07-28: 2 mg via INTRAVENOUS

## 2024-07-28 MED ORDER — OXYCODONE HCL 5 MG/5ML PO SOLN: 5.0000 mg | Freq: Once | ORAL | Status: DC | PRN

## 2024-07-28 MED ORDER — OXYCODONE HCL 5 MG PO TABS: 5.0000 mg | ORAL_TABLET | Freq: Once | ORAL | Status: DC | PRN

## 2024-07-28 MED ORDER — FENTANYL CITRATE (PF) 250 MCG/5ML IJ SOLN
INTRAMUSCULAR | Status: DC | PRN
  Administered 2024-07-28: 50 ug via INTRAVENOUS

## 2024-07-28 MED ORDER — FENTANYL CITRATE (PF) 250 MCG/5ML IJ SOLN
INTRAMUSCULAR | Status: AC
  Filled 2024-07-28: qty 5

## 2024-07-28 MED ORDER — LIDOCAINE HCL (PF) 1 % IJ SOLN
INTRAMUSCULAR | Status: AC
  Filled 2024-07-28: qty 30

## 2024-07-28 MED ORDER — EPHEDRINE SULFATE-NACL 50-0.9 MG/10ML-% IV SOSY
PREFILLED_SYRINGE | INTRAVENOUS | Status: DC | PRN
  Administered 2024-07-28: 10 mg via INTRAVENOUS

## 2024-07-28 MED ORDER — ORAL CARE MOUTH RINSE: 15.0000 mL | Freq: Once | OROMUCOSAL | Status: AC

## 2024-07-28 MED ORDER — LEVONORGESTREL 20 MCG/DAY IU IUD
1.0000 | INTRAUTERINE_SYSTEM | INTRAUTERINE | Status: DC
  Filled 2024-07-28: qty 1

## 2024-07-28 MED ORDER — DROPERIDOL 2.5 MG/ML IJ SOLN: 0.6250 mg | Freq: Once | INTRAMUSCULAR | Status: DC | PRN

## 2024-07-28 MED ORDER — MIDAZOLAM HCL 2 MG/2ML IJ SOLN
INTRAMUSCULAR | Status: AC
  Filled 2024-07-28: qty 2

## 2024-07-28 MED ORDER — ONDANSETRON HCL 4 MG/2ML IJ SOLN
INTRAMUSCULAR | Status: DC | PRN
  Administered 2024-07-28: 4 mg via INTRAVENOUS

## 2024-07-28 MED ORDER — GLYCOPYRROLATE PF 0.2 MG/ML IJ SOSY
PREFILLED_SYRINGE | INTRAMUSCULAR | Status: DC | PRN
Start: 2024-07-28 — End: 2024-07-28
  Administered 2024-07-28: .2 mg via INTRAVENOUS

## 2024-07-28 MED ORDER — DEXAMETHASONE SODIUM PHOSPHATE 10 MG/ML IJ SOLN
INTRAMUSCULAR | Status: DC | PRN
  Administered 2024-07-28: 10 mg via INTRAVENOUS

## 2024-07-28 MED ORDER — PROPOFOL 10 MG/ML IV BOLUS
INTRAVENOUS | Status: DC | PRN
  Administered 2024-07-28: 100 mg via INTRAVENOUS
  Administered 2024-07-28 (×2): 50 mg via INTRAVENOUS

## 2024-07-28 MED ORDER — LIDOCAINE 2% (20 MG/ML) 5 ML SYRINGE
INTRAMUSCULAR | Status: DC | PRN
  Administered 2024-07-28: 40 mg via INTRAVENOUS

## 2024-07-28 MED ORDER — CHLORHEXIDINE GLUCONATE 0.12 % MT SOLN
15.0000 mL | Freq: Once | OROMUCOSAL | Status: AC
  Administered 2024-07-28: 15 mL via OROMUCOSAL
  Filled 2024-07-28: qty 15

## 2024-07-28 MED ORDER — SODIUM CHLORIDE 0.9 % IR SOLN
Status: DC | PRN
  Administered 2024-07-28: 3000 mL

## 2024-07-28 MED ORDER — LACTATED RINGERS IV SOLN: INTRAVENOUS | Status: DC

## 2024-07-28 MED ORDER — GLYCOPYRROLATE PF 0.2 MG/ML IJ SOSY
PREFILLED_SYRINGE | INTRAMUSCULAR | Status: AC
  Filled 2024-07-28: qty 2

## 2024-07-28 MED ORDER — LIDOCAINE HCL 1 % IJ SOLN
INTRAMUSCULAR | Status: DC | PRN
Start: 2024-07-28 — End: 2024-07-28
  Administered 2024-07-28: 20 mL

## 2024-07-28 MED ORDER — SILVER NITRATE-POT NITRATE 75-25 % EX MISC
CUTANEOUS | Status: DC | PRN
  Administered 2024-07-28: 3

## 2024-07-28 MED ORDER — LIDOCAINE 2% (20 MG/ML) 5 ML SYRINGE
INTRAMUSCULAR | Status: AC
  Filled 2024-07-28: qty 5

## 2024-07-28 MED ORDER — FENTANYL CITRATE (PF) 100 MCG/2ML IJ SOLN: 25.0000 ug | INTRAMUSCULAR | Status: DC | PRN

## 2024-07-28 SURGICAL SUPPLY — 21 items
CATH ROBINSON RED A/P 16FR (CATHETERS) ×1 IMPLANT
COVER MAYO STAND STRL (DRAPES) ×1 IMPLANT
DEVICE MYOSURE LITE (MISCELLANEOUS) IMPLANT
DEVICE MYOSURE REACH (MISCELLANEOUS) IMPLANT
DILATOR CANAL MILEX (MISCELLANEOUS) IMPLANT
GLOVE BIOGEL PI IND STRL 7.0 (GLOVE) ×1 IMPLANT
GLOVE BIOGEL PI IND STRL 7.5 (GLOVE) ×1 IMPLANT
GLOVE SURG SS PI 7.0 STRL IVOR (GLOVE) ×1 IMPLANT
GLOVE SURG UNDER POLY LF SZ7 (GLOVE) ×1 IMPLANT
GOWN STRL REUS W/ TWL LRG LVL3 (GOWN DISPOSABLE) ×1 IMPLANT
GOWN STRL REUS W/ TWL XL LVL3 (GOWN DISPOSABLE) ×1 IMPLANT
HIBICLENS CHG 4% 4OZ BTL (MISCELLANEOUS) ×1 IMPLANT
KIT PROCEDURE FLUENT (KITS) ×1 IMPLANT
KIT TURNOVER KIT B (KITS) ×1 IMPLANT
NS IRRIG 1000ML POUR BTL (IV SOLUTION) ×1 IMPLANT
PACK VAGINAL MINOR WOMEN LF (CUSTOM PROCEDURE TRAY) ×1 IMPLANT
PAD OB MATERNITY 11 LF (PERSONAL CARE ITEMS) ×1 IMPLANT
SEAL ROD LENS SCOPE MYOSURE (ABLATOR) IMPLANT
SUT VIC AB 3-0 SH 27X BRD (SUTURE) IMPLANT
TOWEL GREEN STERILE FF (TOWEL DISPOSABLE) ×2 IMPLANT
UNDERPAD 30X36 HEAVY ABSORB (UNDERPADS AND DIAPERS) ×1 IMPLANT

## 2024-07-28 NOTE — Discharge Instructions (Signed)
     Care After These instructions give you information on caring for yourself after your procedure. Your doctor may also give you more specific instructions. Call your doctor if you have any problems or questions after your procedure. HOME CARE Do not drive for 24 hours. Wait 1 week before doing any activities that wear you out. Do not stand for a long time. Limit stair climbing to once or twice a day. Rest often. Continue with your usual diet. Drink enough fluids to keep your pee (urine) clear or pale yellow. If you have a hard time pooping (constipation), you may: Take a medicine to help you go poop (laxative) as told by your doctor. Eat more fruit and bran. Drink more fluids. Take showers, not baths, for as long as told by your doctor. Do not swim or use a hot tub until your doctor says it is okay. Have someone with you for 1day after the procedure. Do not douche, use tampons, or have sex (intercourse) until seen by your doctor Only take medicines as told by your doctor. Do not take aspirin . It can cause bleeding. Keep all doctor visits. GET HELP IF: You have cramps or pain not helped by medicine. You have new pain in the belly (abdomen). You have a bad smelling fluid coming from your vagina. You have a rash. You have problems with any medicine. GET HELP RIGHT AWAY IF:  You start to bleed more than a regular period. You have a fever. You have chest pain. You have trouble breathing. You feel dizzy or feel like passing out (fainting). You pass out. You have pain in the tops of your shoulders. You have vaginal bleeding with or without clumps of blood (blood clots). MAKE SURE YOU: Understand these instructions. Will watch your condition. Will get help right away if you are not doing well or get worse. Document Released: 09/25/2008 Document Revised: 12/22/2013 Document Reviewed: 07/16/2013 [facility] Patient Information 2015 [place], [place]. This information is not intended to  replace advice given to you by your health care provider. Make sure you discuss any questions you have with your health care provider.

## 2024-07-28 NOTE — H&P (Signed)
 Obstetrics & Gynecology Surgical H&P   Date of Surgery: 07/28/2024   Primary OBGYN: [facility]  Reason for Admission: scheduled surgery of hysteroscopy, d&c, Mirena  IUD placement for AUB, cramping, cervical stenosis.    History of Present Illness: Ms. Kim [name] is a 34 y.o. [id number] (Patient's last menstrual period was 07/03/2024 (approximate).), with the above chief complaint. Past medical history is significant for h/o c-section x 4 with BTL and h/o appendectomy, cervical stenosis, h/o opiate addiction, tobacco abuse, asthma.   Patient last seen 05/11/2024. At that time, she was still noting intermittent cramping throughout the week but no AUB and she had a short period for a day earlier that month. D/w her and she was amenable to hysteroscopy, d&c, as well as mirena  placement due to her h/o amenorrhea with it.   Patient initially seen on 3/24 after referral for AUB and abnormal imaging.    Patient seen late October 2024 for abdominal pain and diagnosed with appendicitis and underwent a l/s appy. CT showed possible pelvic congestion based on prominent vasculature; October 2023 CT wnl.    She also stated  she's had AUB for the past six months along with increased cramping. Prior to this, her periods were qmonth and regular. She'll have two periods a month that can be 3 days to a week. Pap and STI swab testing negative earlier this year.    Plan at that time was u/s and follow up. U/s on 4/11 showed no e/o congestion syndrome with a 6x2x84mm endometrial cyst at the LUS/area of the c/s incision with no overt vascularity noted. At her 4/21 f/u, EMBx attempt done with severe cervical stenosis noted and unable to enter into the uterine cavity and patient got vagal.   ROS: A 12-point review of systems was performed and negative, except as stated in the above HPI.  OBGYN History: As per HPI. OB History  Gravida Para Term Preterm AB Living  5 4 4  1 4   SAB IAB Ectopic  Multiple Live Births  1   0 4    # Outcome Date GA Lbr Len/2nd Weight Sex Type Anes PTL Lv  5 Term 09/05/16 [redacted]w[redacted]d  3125 g F CS-LTranv Spinal  LIV  4 Term 07/03/15 [redacted]w[redacted]d  3118 g M CS-LTranv Spinal  LIV     Birth Comments: Hgb, Normal, FA Newborn Screen Barcode: [id number] Date Collected: 07/04/2015  3 Term 02/04/11    F CS-LTranv   LIV     Complications: Pre-eclampsia  2 Term 01/12/09    M CS-LTranv EPI N LIV     Complications: Fetal Intolerance  1 SAB            History of pap smears: Yes. Last pap smear 01/2024 negative and HPV negative   Past Medical History: Past Medical History:  Diagnosis Date   Acute appendicitis 10/22/2023   Anemia    Anxiety    Asthma    Chest pain    Depression    Fibromyalgia    Gestational diabetes 2010, 2012   H/O pre-eclampsia in prior pregnancy, currently pregnant 12/14/2014   Baby ASA--baseline labs--     Heartburn during pregnancy    Ileus, postoperative (HCC) 09/10/2016   Rectal bleeding    Seizures (HCC) 2015   x 1- unknown reason- passed out   Ulcer     Past Surgical History: Past Surgical History:  Procedure Laterality Date   CESAREAN SECTION     x 2   CESAREAN SECTION  WITH BILATERAL TUBAL LIGATION Bilateral 07/03/2015   Procedure: REPEAT CESAREAN SECTION ;  Surgeon: Gloris DELENA Hugger, MD;  Location: [facility];  Service: Obstetrics;  Laterality: Bilateral;   CESAREAN SECTION WITH BILATERAL TUBAL LIGATION Bilateral 09/05/2016   Procedure: REPEAT CESAREAN SECTION WITH BILATERAL TUBAL LIGATION;  Surgeon: Bebe Furry, MD;  Location: [facility];  Service: Obstetrics;  Laterality: Bilateral;   CHOLECYSTECTOMY     COLONOSCOPY  04/16/2008   LAPAROSCOPIC APPENDECTOMY N/A 10/22/2023   Procedure: APPENDECTOMY LAPAROSCOPIC;  Surgeon: Stechschulte, Deward PARAS, MD;  Location: [facility] OR;  Service: General;  Laterality: N/A;   WISDOM TOOTH EXTRACTION      Family History:  Family History  Problem Relation Age of Onset   Depression Mother     Fibromyalgia Mother    Cancer Mother        breast   Ovarian cancer Mother    Diabetes Maternal Grandmother    Hypertension Maternal Grandmother    Arthritis Maternal Grandmother    Thyroid disease Maternal Grandmother    Colon polyps Maternal Grandmother    Irritable bowel syndrome Maternal Grandmother    Social History:  Social History   Socioeconomic History   Marital status: Single    Spouse name: Not on file   Number of children: Not on file   Years of education: Not on file   Highest education level: Some college, no degree  Occupational History   Not on file  Tobacco Use   Smoking status: Every Day    Current packs/day: 0.50    Types: Cigarettes    Passive exposure: Current   Smokeless tobacco: Former    Quit date: 08/24/2014  Vaping Use   Vaping status: Never Used  Substance and Sexual Activity   Alcohol use: No   Drug use: Yes    Types: Marijuana    Comment: weekly   Sexual activity: Yes    Birth control/protection: None  Other Topics Concern   Not on file  Social History Narrative   Not on file   Social Drivers of Health   Financial Resource Strain: Low Risk  (01/28/2024)   Overall Financial Resource Strain (CARDIA)    Difficulty of Paying Living Expenses: Not very hard  Food Insecurity: No Food Insecurity (01/28/2024)   Hunger Vital Sign    Worried About Running Out of Food in the Last Year: Never true    Ran Out of Food in the Last Year: Never true  Transportation Needs: No Transportation Needs (01/28/2024)   PRAPARE - Administrator, Civil Service (Medical): No    Lack of Transportation (Non-Medical): No  Physical Activity: Sufficiently Active (01/28/2024)   Exercise Vital Sign    Days of Exercise per Week: 4 days    Minutes of Exercise per Session: 100 min  Stress: Stress Concern Present (01/28/2024)   [employer] of Occupational Health - Occupational Stress Questionnaire    Feeling of Stress : To some extent  Social Connections:  Moderately Isolated (01/28/2024)   Social Connection and Isolation Panel    Frequency of Communication with Friends and Family: Three times a week    Frequency of Social Gatherings with Friends and Family: Once a week    Attends Religious Services: Never    Database administrator or Organizations: No    Attends Banker Meetings: Not on file    Marital Status: Living with partner  Intimate Partner Violence: Not At Risk (10/22/2023)   Humiliation, Afraid, Rape, and  Kick questionnaire    Fear of Current or Ex-Partner: No    Emotionally Abused: No    Physically Abused: No    Sexually Abused: No    Allergy: No Known Allergies  Current Outpatient Medications: Current Facility-Administered Medications  Medication Dose Route Frequency Provider Last Rate Last Admin   chlorhexidine  (PERIDEX ) 0.12 % solution 15 mL  15 mL Mouth/Throat Once Erma Thom SAUNDERS, MD       Or   Oral care mouth rinse  15 mL Mouth Rinse Once Erma Thom SAUNDERS, MD       lactated ringers  infusion   Intravenous Continuous Izell Harari, MD       lactated ringers  infusion   Intravenous Continuous Erma Thom SAUNDERS, MD       levonorgestrel  (MIRENA ) 20 MCG/DAY IUD 1 each  1 each Intrauterine To OR Izell Harari, MD         Physical Exam:  Current Vital Signs 24h Vital Sign Ranges  T 98.9 F (37.2 C) Temp  Avg: 98.9 F (37.2 C)  Min: 98.9 F (37.2 C)  Max: 98.9 F (37.2 C)  BP 103/68 BP  Min: 103/68  Max: 103/68  HR 63 Pulse  Avg: 63  Min: 63  Max: 63  RR 17 Resp  Avg: 17  Min: 17  Max: 17  SaO2 99 % Room Air SpO2  Avg: 99 %  Min: 99 %  Max: 99 %       24 Hour I/O Current Shift I/O  Time Ins Outs No intake/output data recorded. No intake/output data recorded.   Body mass index is 15.66 kg/m. General appearance: Well nourished, well developed female in no acute distress.  Cardiovascular: S1, S2 normal, no murmur, rub or gallop, regular rate and rhythm Respiratory:  Clear to auscultation bilateral. Normal  respiratory effort Abdomen: positive bowel sounds and no masses, hernias; diffusely non tender to palpation, non distended Neuro/Psych:  Normal mood and affect.  Skin:  Warm and dry.  Extremities: no clubbing, cyanosis, or edema.   From 04/20/2024 EMBx attempt but patient had tight cervical stenosis at the internal os (see procedure note for details) and patient had ?vagal response. Procedure aborted, no sampling done    Laboratory: none  Imaging:  Narrative & Impression  CLINICAL DATA:  Abnormal uterine bleeding.   EXAM: TRANSABDOMINAL AND TRANSVAGINAL ULTRASOUND OF PELVIS   TECHNIQUE: Both transabdominal and transvaginal ultrasound examinations of the pelvis were performed. Transabdominal technique was performed for global imaging of the pelvis including uterus, ovaries, adnexal regions, and pelvic cul-de-sac. It was necessary to proceed with endovaginal exam following the transabdominal exam to visualize the endometrium, uterus and ovaries.   COMPARISON:  CT abdomen pelvis October 22, 2023   FINDINGS: Uterus   Measurements: 8.8 x 3.9 x 5.5 cm = volume: 99.0 mL. No fibroids or other mass visualized.   Endometrium   Thickness: 4.1 mm. There is a 6 x 2 x 8 mm cyst in the endometrium.   Right ovary   Measurements: 3.2 x 1.5 x 2.6 cm = volume: 6.2 mL. Normal appearance/no adnexal mass.   Left ovary   Measurements: 2.8 x 1.1 x 2 cm = volume: 3.2 mL. Normal appearance/no adnexal mass.   Other findings   No abnormal free fluid. Nabothian cysts are identified within the cervix.   IMPRESSION: 1. No acute abnormality identified. 2. 6 x 2 x 8 mm cyst in the endometrium. This is nonspecific but can be seen in the setting of  endometrial hyperplasia.     Electronically Signed   By: Craig Farr M.D.   On: 04/10/2024 08:56   Assessment: Ms. Kim [name] is a 34 y.o. [id number] (Patient's last menstrual period was 07/03/2024 (approximate).) here for scheduled surgery; pt  stable  Plan: D/w pt and still amenable to hysteroscopy, d&c, mirena  placement, all indicated procedures.   Bebe Izell Overcast MD Attending [facility])

## 2024-07-28 NOTE — Anesthesia Procedure Notes (Signed)
 Procedure Name: LMA Insertion Date/Time: 07/28/2024 2:21 PM  Performed by: Christopher Comings, CRNAPre-anesthesia Checklist: Patient identified, Emergency Drugs available, Suction available and Patient being monitored Patient Re-evaluated:Patient Re-evaluated prior to induction Oxygen Delivery Method: Circle system utilized Preoxygenation: Pre-oxygenation with 100% oxygen Induction Type: IV induction Ventilation: Mask ventilation without difficulty LMA: LMA inserted LMA Size: 3.0 Number of attempts: 1 Placement Confirmation: positive ETCO2 and breath sounds checked- equal and bilateral Tube secured with: Tape Dental Injury: Teeth and Oropharynx as per pre-operative assessment

## 2024-07-28 NOTE — Anesthesia Postprocedure Evaluation (Signed)
 Anesthesia Post Note  Patient: Kim [name]  Procedure(s) Performed: Exam under Anesthesa cervical dilitation     Patient location during evaluation: PACU Anesthesia Type: General Level of consciousness: awake and alert Pain management: pain level controlled Vital Signs Assessment: post-procedure vital signs reviewed and stable Respiratory status: spontaneous breathing, nonlabored ventilation, respiratory function stable and patient connected to nasal cannula oxygen Cardiovascular status: blood pressure returned to baseline and stable Postop Assessment: no apparent nausea or vomiting Anesthetic complications: no   There were no known notable events for this encounter.  Last Vitals:  Vitals:   07/28/24 1530 07/28/24 1545  BP: 112/71 105/73  Pulse: 98 83  Resp: (!) 26 20  Temp:  36.6 C  SpO2: 99% 100%    Last Pain:  Vitals:   07/28/24 1530  TempSrc:   PainSc: 0-No pain                 Thom JONELLE Peoples

## 2024-07-28 NOTE — Brief Op Note (Signed)
 07/28/2024  2:50 PM  PATIENT:  Kim [name]  34 y.o. female  PRE-OPERATIVE DIAGNOSIS:  AUB. Abdominal pain. Cervical stenosis.   POST-OPERATIVE DIAGNOSIS:  Same. Cervical perforation.   PROCEDURE: EUA. Cervical dilation. Repair of external os laceration.   SURGEON:  Surgeons and Role:    * Izell Harari, MD - Primary  ASSISTANTS: none   ANESTHESIA:   general and paracervical block  EBL:  10 mL   UOP: I/O cath done pre op  IVF fluids: per OR note  LOCAL MEDICATIONS USED:  LIDOCAINE    SPECIMEN:  none  DISPOSITION OF SPECIMEN:  N/A  COUNTS:  YES  DICTATION: .Note written in EPIC  PLAN OF CARE: Discharge to home after PACU  PATIENT DISPOSITION:  PACU - hemodynamically stable.   Delay start of Pharmacological VTE agent (>24hrs) due to surgical blood loss or risk of bleeding: not applicable  Harari Izell Raddle MD Attending [facility] (Faculty Practice) 07/28/2024 Time: [phone]

## 2024-07-28 NOTE — Transfer of Care (Signed)
 Immediate Anesthesia Transfer of Care Note  Patient: Kim [name]  Procedure(s) Performed: Exam under Anesthesa cervical dilitation  Patient Location: PACU  Anesthesia Type:General  Level of Consciousness: awake, alert , and oriented  Airway & Oxygen Therapy: Patient Spontanous Breathing  Post-op Assessment: Report given to RN, Post -op Vital signs reviewed and stable, and Patient moving all extremities X 4  Post vital signs: Reviewed and stable  Last Vitals:  Vitals Value Taken Time  BP 120/74 07/28/24 15:04  Temp    Pulse 115 07/28/24 15:04  Resp 20 07/28/24 15:04  SpO2 99 % 07/28/24 15:04  Vitals shown include unfiled device data.  Last Pain:  Vitals:   07/28/24 1153  TempSrc:   PainSc: 0-No pain         Complications: There were no known notable events for this encounter.

## 2024-07-28 NOTE — Op Note (Signed)
 Operative Note   07/28/2024  PRE-OP DIAGNOSIS: AUB. Pelvic pain. Cervical stenosis   POST-OP DIAGNOSIS: Same. Cervical perforation. Nabothian cyst  SURGEON: Surgeons and Role:    DEWAINE Izell Harari, MD - Primary  ASSISTANT: None  PROCEDURE: Exam under Anesthesia, cervical dilation, repair of external os laceration from tenaculum.   ANESTHESIA: General and paracervical block   ESTIMATED BLOOD LOSS: 10mL   DRAINS: I/O cath done, per OR note   TOTAL IV FLUIDS: per OR note  SPECIMENS: None  VTE PROPHYLAXIS: SCDs to the bilateral lower extremities  ANTIBIOTICS: Not indicated  FLUID DEFICIT:  COMPLICATIONS: posterior cervical or lower uterine segment perforation  DISPOSITION: PACU - hemodynamically stable.  CONDITION: stable  FINDINGS: Exam under anesthesia revealed normal EGBUS, vagina and mobile cervix. (1cm nabothian cyst at 7 o'clock), and somewhat mobile uterus with no masses and bilateral adnexa without masses or fullness.   Extremely stenotic cervix canal. Epiploica and bowel on hysteroscopy consistent with posterior perforation at the cervix. No evidence I ever got in the uterus and no evidence of intra-abdominal injury.   PROCEDURE IN DETAIL:  After informed consent was obtained, the patient was taken to the operating room where anesthesia was obtained without difficulty. The patient was positioned in the dorsal lithotomy position in Morris stirrups.  The patient's bladder was catheterized with an in and out foley catheter.  The patient was examined under anesthesia, with the above noted findings.  The bi-valved speculum was placed inside the patient's vagina, and the the anterior lip of the cervix was seen and grasped with the tenaculum.  A paracervical block was achieved with 20mL 1% lidocaine .  I was able to sound a few cm and then blocked. I used yellow os finders and then 11 to 13 french pratt dilator and uterine sound to try and find the tract. The sound then did  slide so I introduced the hysteroscope with the above noted findings.  The hystersocope was removed and excellent hemostasis was noted, and all instruments were removed, with excellent hemostasis noted throughout; I did place a 2-0 vicryl figure stitch at the tenaculum site for the slight laceration. She was then taken out of dorsal lithotomy.  The patient tolerated the procedure well.  Sponge, lap and instrument counts were correct x2.  The patient was taken to recovery room in excellent condition.  Harari Izell Raddle MD Attending [facility])

## 2024-07-28 NOTE — Anesthesia Preprocedure Evaluation (Addendum)
 Anesthesia Evaluation  Patient identified by MRN, date of birth, ID band Patient awake    Reviewed: Allergy & Precautions, H&P , NPO status , Patient's Chart, lab work & pertinent test results  History of Anesthesia Complications Negative for: history of anesthetic complications  Airway Mallampati: II   Neck ROM: full    Dental  (+) Edentulous Upper, Edentulous Lower   Pulmonary asthma , Current Smoker and Patient abstained from smoking.   breath sounds clear to auscultation       Cardiovascular negative cardio ROS  Rhythm:regular Rate:Normal     Neuro/Psych Seizures -,  PSYCHIATRIC DISORDERS Anxiety Depression Bipolar Disorder    Neuromuscular disease    GI/Hepatic   Endo/Other  diabetes, Type 2    Renal/GU      Musculoskeletal  (+)  Fibromyalgia -  Abdominal   Peds  Hematology  (+) Blood dyscrasia, anemia   Anesthesia Other Findings  Abnormal uterine bleeding      Pelvic Pain    Reproductive/Obstetrics                              Anesthesia Physical Anesthesia Plan  ASA: 2  Anesthesia Plan: General   Post-op Pain Management: Tylenol  PO (pre-op)*   Induction: Intravenous  PONV Risk Score and Plan: 2 and Ondansetron , Dexamethasone , Midazolam  and Treatment may vary due to age or medical condition  Airway Management Planned: LMA  Additional Equipment:   Intra-op Plan:   Post-operative Plan: Extubation in OR  Informed Consent: I have reviewed the patients History and Physical, chart, labs and discussed the procedure including the risks, benefits and alternatives for the proposed anesthesia with the patient or authorized representative who has indicated his/her understanding and acceptance.     Dental advisory given  Plan Discussed with: CRNA, Anesthesiologist and Surgeon  Anesthesia Plan Comments:         Anesthesia Quick Evaluation

## 2024-07-29 ENCOUNTER — Encounter (HOSPITAL_COMMUNITY): Payer: Self-pay | Admitting: Obstetrics and Gynecology

## 2024-07-30 ENCOUNTER — Telehealth: Payer: Self-pay | Admitting: Obstetrics and Gynecology

## 2024-07-30 NOTE — Telephone Encounter (Signed)
 GYN Telephone Note Patient called at [phone] and doing well from surgery two days ago.  D/w her re: surgical findings. I told her s/s from cervical stenosis and having periods. D/w her can try OCPs, depo provera or nexplanon with 50% chance of causing amenorrhea. I also told her that could try u/s guidance next time but I don't feel that would be help as there was no tract that to follow during her surgery. I also d/w her re: TLH/BS. Last c/s in 2017 and no adhesions. I d/w her re: l/s hyst and potential for open procedure.  The risks of surgery were discussed in detail with the patient including but not limited to: bleeding which may require transfusion or reoperation; infection which may require prolonged hospitalization or re-hospitalization and antibiotic therapy; injury to bowel, bladder, ureters and major vessels or other surrounding organs which may lead to other procedures; formation of adhesions; need for additional procedures including laparotomy or subsequent procedures secondary to intraoperative injury or abnormal pathology; thromboembolic phenomenon; incisional problems and other postoperative or anesthesia complications.  Patient was told that the likelihood that her condition and symptoms will be treated effectively with this surgical management was very high; the postoperative expectations were also discussed in detail. The patient also understands the alternative treatment options which were discussed in full. All questions were answered.  After discussion, she'd like to proceed with TLH/BS/cysto  She was told that she will be contacted by our surgical scheduler regarding the time and date of her surgery   Request sent to office  Bebe Izell Raddle MD Attending [facility] (Faculty Practice) 07/30/2024 Time: 1330

## 2024-08-09 ENCOUNTER — Encounter: Payer: Self-pay | Admitting: Obstetrics and Gynecology

## 2024-08-10 ENCOUNTER — Telehealth: Payer: Self-pay

## 2024-08-10 NOTE — Telephone Encounter (Signed)
 I called patient to see if she's available for surgery w/ Dr. Izell on 10/19/24 at 10:30 am. Patient agreed to surgery date, and provided pre-op instructions and surgery details by phone.

## 2024-08-17 ENCOUNTER — Telehealth: Payer: Self-pay | Admitting: *Deleted

## 2024-08-17 NOTE — Telephone Encounter (Signed)
 Called pt to let her know we needed her to come in and sign a form prior to her hysterectomy. Pt will come in today or tomorrow

## 2024-08-19 ENCOUNTER — Encounter: Payer: Self-pay | Admitting: *Deleted

## 2024-10-12 ENCOUNTER — Other Ambulatory Visit: Payer: Self-pay | Admitting: Obstetrics and Gynecology

## 2024-10-12 DIAGNOSIS — N939 Abnormal uterine and vaginal bleeding, unspecified: Secondary | ICD-10-CM

## 2024-10-12 NOTE — Progress Notes (Signed)
 Surgical Instructions   Your procedure is scheduled on Monday, October 20th. Report to [facility] Main Entrance A at 10:15 A.M., then check in with the Admitting office. Any questions or running late day of surgery: call [phone]  Questions prior to your surgery date: call [phone], Monday-Friday, 8am-4pm. If you experience any cold or flu symptoms such as cough, fever, chills, shortness of breath, etc. between now and your scheduled surgery, please notify us  at the above number.     Remember:  Do not eat after midnight the night before your surgery   You may drink clear liquids until 9:15 the morning of your surgery.   Clear liquids allowed are: Water , Non-Citrus Juices (without pulp), Clear Carbonated Beverages, Clear Tea (no milk, honey, etc.), Black Coffee Only (NO MILK, CREAM OR POWDERED CREAMER of any kind), and Gatorade.    Take these medicines the morning of surgery with A SIP OF WATER : NONE   May take these medicines IF NEEDED: acetaminophen  (TYLENOL )  albuterol  (VENTOLIN  HFA) inhaler - bring with you on day of surgery    One week prior to surgery, STOP taking any Aspirin  (unless otherwise instructed by your surgeon) Aleve, Naproxen, Ibuprofen , Motrin , Advil , Goody's, BC's, all herbal medications, fish oil, and non-prescription vitamins.                      Do NOT Smoke (Tobacco/Vaping) for 24 hours prior to your procedure.  If you use a CPAP at night, you may bring your mask/headgear for your overnight stay.   You will be asked to remove any contacts, glasses, piercing's, hearing aid's, dentures/partials prior to surgery. Please bring cases for these items if needed.    Patients discharged the day of surgery will not be allowed to drive home, and someone needs to stay with them for 24 hours.  SURGICAL WAITING ROOM VISITATION Patients may have no more than 2 support people in the waiting area - these visitors may rotate.   Pre-op nurse will coordinate an  appropriate time for 1 ADULT support person, who may not rotate, to accompany patient in pre-op.  Children under the age of 56 must have an adult with them who is not the patient and must remain in the main waiting area with an adult.  If the patient needs to stay at the hospital during part of their recovery, the visitor guidelines for inpatient rooms apply.  Please refer to the [facility] website for the visitor guidelines for any additional information.   If you received a COVID test during your pre-op visit  it is requested that you wear a mask when out in public, stay away from anyone that may not be feeling well and notify your surgeon if you develop symptoms. If you have been in contact with anyone that has tested positive in the last 10 days please notify you surgeon.      Pre-operative CHG Bathing Instructions   You can play a key role in reducing the risk of infection after surgery. Your skin needs to be as free of germs as possible. You can reduce the number of germs on your skin by washing with CHG (chlorhexidine  gluconate) soap before surgery. CHG is an antiseptic soap that kills germs and continues to kill germs even after washing.   DO NOT use if you have an allergy to chlorhexidine /CHG or antibacterial soaps. If your skin becomes reddened or irritated, stop using the CHG and notify one of our RNs at [phone].  TAKE A SHOWER THE NIGHT BEFORE SURGERY   Please keep in mind the following:  DO NOT shave, including legs and underarms, 48 hours prior to surgery.   You may shave your face before/day of surgery.  Place clean sheets on your bed the night before surgery Use a clean washcloth (not used since being washed) for shower. DO NOT sleep with pet's night before surgery.  CHG Shower Instructions:  Wash your face and private area with normal soap. If you choose to wash your hair, wash first with your normal shampoo.  After you use shampoo/soap, rinse your  hair and body thoroughly to remove shampoo/soap residue.  Turn the water  OFF and apply half the bottle of CHG soap to a CLEAN washcloth.  Apply CHG soap ONLY FROM YOUR NECK DOWN TO YOUR TOES (washing for 3-5 minutes)  DO NOT use CHG soap on face, private areas, open wounds, or sores.  Pay special attention to the area where your surgery is being performed.  If you are having back surgery, having someone wash your back for you may be helpful. Wait 2 minutes after CHG soap is applied, then you may rinse off the CHG soap.  Pat dry with a clean towel  Put on clean pajamas    Additional instructions for the day of surgery: If you choose, you may shower the morning of surgery with an antibacterial soap.  DO NOT APPLY any lotions, deodorants, cologne, or perfumes.   Do not wear jewelry or makeup Do not wear nail polish, gel polish, artificial nails, or any other type of covering on natural nails (fingers and toes) Do not bring valuables to the hospital. [facility] is not responsible for valuables/personal belongings. Put on clean/comfortable clothes.  Please brush your teeth.  Ask your nurse before applying any prescription medications to the skin.

## 2024-10-13 ENCOUNTER — Encounter (HOSPITAL_COMMUNITY): Payer: Self-pay

## 2024-10-13 ENCOUNTER — Other Ambulatory Visit: Payer: Self-pay

## 2024-10-13 ENCOUNTER — Encounter (HOSPITAL_COMMUNITY)
Admission: RE | Admit: 2024-10-13 | Discharge: 2024-10-13 | Disposition: A | Discharge status: Home / Self Care | Source: Ambulatory Visit | Attending: Obstetrics and Gynecology | Admitting: Obstetrics and Gynecology

## 2024-10-13 VITALS — BP 110/52 | HR 66 | Temp 98.6°F | Resp 16 | Ht 68.0 in | Wt 104.0 lb

## 2024-10-13 DIAGNOSIS — N939 Abnormal uterine and vaginal bleeding, unspecified: Secondary | ICD-10-CM | POA: Insufficient documentation

## 2024-10-13 DIAGNOSIS — R001 Bradycardia, unspecified: Secondary | ICD-10-CM | POA: Insufficient documentation

## 2024-10-13 DIAGNOSIS — Z01818 Encounter for other preprocedural examination: Secondary | ICD-10-CM | POA: Diagnosis present

## 2024-10-13 LAB — CBC
HCT: 34.1 % — ABNORMAL LOW (ref 36.0–46.0)
Hemoglobin: 11.2 g/dL — ABNORMAL LOW (ref 12.0–15.0)
MCH: 29.7 pg (ref 26.0–34.0)
MCHC: 32.8 g/dL (ref 30.0–36.0)
MCV: 90.5 fL (ref 80.0–100.0)
Platelets: 187 K/uL (ref 150–400)
RBC: 3.77 MIL/uL — ABNORMAL LOW (ref 3.87–5.11)
RDW: 13.5 % (ref 11.5–15.5)
WBC: 5.4 K/uL (ref 4.0–10.5)
nRBC: 0 % (ref 0.0–0.2)

## 2024-10-13 LAB — TYPE AND SCREEN
ABO/RH(D): O POS
Antibody Screen: NEGATIVE

## 2024-10-13 LAB — COMPREHENSIVE METABOLIC PANEL WITH GFR
ALT: 13 U/L (ref 0–44)
AST: 18 U/L (ref 15–41)
Albumin: 3.7 g/dL (ref 3.5–5.0)
Alkaline Phosphatase: 69 U/L (ref 38–126)
Anion gap: 8 (ref 5–15)
BUN: 7 mg/dL (ref 6–20)
CO2: 25 mmol/L (ref 22–32)
Calcium: 8.8 mg/dL — ABNORMAL LOW (ref 8.9–10.3)
Chloride: 105 mmol/L (ref 98–111)
Creatinine, Ser: 0.81 mg/dL (ref 0.44–1.00)
GFR, Estimated: >60 mL/min (ref >60)
Glucose, Bld: 84 mg/dL (ref 70–99)
Potassium: 3.6 mmol/L (ref 3.5–5.1)
Sodium: 138 mmol/L (ref 135–145)
Total Bilirubin: 0.4 mg/dL (ref 0.0–1.2)
Total Protein: 6.2 g/dL — ABNORMAL LOW (ref 6.5–8.1)

## 2024-10-13 LAB — SURGICAL PCR SCREEN
MRSA, PCR: NEGATIVE
Staphylococcus aureus: NEGATIVE

## 2024-10-13 NOTE — Progress Notes (Signed)
 PCP - Dr. Raguel Blush Cardiologist - Denies  PPM/ICD - Denies Device Orders - n/a Rep Notified - n/a  Chest x-ray - n/a EKG - 10-13-24 Stress Test - 07-07-10 ECHO - 07-07-10 Cardiac Cath - Denies  Sleep Study - denies CPAP - n/a  NON-diabetic, gestational diabetes in 2010 & 2012, does not test or take diabetic medication.   Last dose of GLP1 agonist-  denies GLP1 instructions: n/a  Blood Thinner Instructions: denies Aspirin  Instructions: denies  ERAS Protcol - Clears until 0915 PRE-SURGERY Ensure or G2- none  COVID TEST- n/a   Anesthesia review: no  Patient denies shortness of breath, fever, cough and chest pain at PAT appointment. Patient denies any respiratory issues at this time.    All instructions explained to the patient, with a verbal understanding of the material. Patient agrees to go over the instructions while at home for a better understanding. Patient also instructed to self quarantine after being tested for COVID-19. The opportunity to ask questions was provided.

## 2024-10-19 ENCOUNTER — Ambulatory Visit (HOSPITAL_COMMUNITY): Payer: Self-pay | Admitting: Anesthesiology

## 2024-10-19 ENCOUNTER — Other Ambulatory Visit: Payer: Self-pay

## 2024-10-19 ENCOUNTER — Observation Stay (HOSPITAL_COMMUNITY)
Admission: RE | Admit: 2024-10-19 | Discharge: 2024-10-20 | Disposition: A | Discharge status: Home / Self Care | Attending: Obstetrics and Gynecology | Admitting: Obstetrics and Gynecology

## 2024-10-19 ENCOUNTER — Encounter (HOSPITAL_COMMUNITY): Admission: RE | Disposition: A | Payer: Self-pay | Source: Home / Self Care | Attending: Obstetrics and Gynecology

## 2024-10-19 ENCOUNTER — Encounter (HOSPITAL_COMMUNITY): Payer: Self-pay | Admitting: Obstetrics and Gynecology

## 2024-10-19 DIAGNOSIS — F17210 Nicotine dependence, cigarettes, uncomplicated: Secondary | ICD-10-CM | POA: Insufficient documentation

## 2024-10-19 DIAGNOSIS — R102 Pelvic and perineal pain unspecified side: Secondary | ICD-10-CM | POA: Diagnosis not present

## 2024-10-19 DIAGNOSIS — N882 Stricture and stenosis of cervix uteri: Secondary | ICD-10-CM

## 2024-10-19 DIAGNOSIS — Z7982 Long term (current) use of aspirin: Secondary | ICD-10-CM | POA: Diagnosis not present

## 2024-10-19 DIAGNOSIS — N939 Abnormal uterine and vaginal bleeding, unspecified: Secondary | ICD-10-CM | POA: Diagnosis not present

## 2024-10-19 DIAGNOSIS — E119 Type 2 diabetes mellitus without complications: Secondary | ICD-10-CM | POA: Insufficient documentation

## 2024-10-19 DIAGNOSIS — Z9889 Other specified postprocedural states: Principal | ICD-10-CM

## 2024-10-19 DIAGNOSIS — J45909 Unspecified asthma, uncomplicated: Secondary | ICD-10-CM | POA: Insufficient documentation

## 2024-10-19 DIAGNOSIS — Z90710 Acquired absence of both cervix and uterus: Principal | ICD-10-CM | POA: Diagnosis present

## 2024-10-19 DIAGNOSIS — G8929 Other chronic pain: Secondary | ICD-10-CM | POA: Diagnosis present

## 2024-10-19 HISTORY — PX: TOTAL LAPAROSCOPIC HYSTERECTOMY WITH SALPINGECTOMY: SHX6742

## 2024-10-19 HISTORY — PX: CYSTOSCOPY: SHX5120

## 2024-10-19 LAB — POCT PREGNANCY, URINE: Preg Test, Ur: NEGATIVE

## 2024-10-19 SURGERY — HYSTERECTOMY, TOTAL, LAPAROSCOPIC, WITH SALPINGECTOMY
Anesthesia: General

## 2024-10-19 MED ORDER — PROPOFOL 10 MG/ML IV BOLUS
INTRAVENOUS | Status: AC
Start: 2024-10-19 — End: 2024-10-19
  Filled 2024-10-19: qty 20

## 2024-10-19 MED ORDER — ALBUTEROL SULFATE (2.5 MG/3ML) 0.083% IN NEBU
3.0000 mL | INHALATION_SOLUTION | Freq: Four times a day (QID) | RESPIRATORY_TRACT | Status: DC | PRN
Start: 2024-10-19 — End: 2024-10-20

## 2024-10-19 MED ORDER — SCOPOLAMINE 1 MG/3DAYS TD PT72
1.0000 | MEDICATED_PATCH | TRANSDERMAL | Status: DC
  Administered 2024-10-19: 1 mg via TRANSDERMAL
  Filled 2024-10-19: qty 1

## 2024-10-19 MED ORDER — ORAL CARE MOUTH RINSE: 15.0000 mL | Freq: Once | OROMUCOSAL | Status: AC

## 2024-10-19 MED ORDER — ROCURONIUM BROMIDE 10 MG/ML (PF) SYRINGE
PREFILLED_SYRINGE | INTRAVENOUS | Status: DC | PRN
  Administered 2024-10-19: 40 mg via INTRAVENOUS
  Administered 2024-10-19: 30 mg via INTRAVENOUS
  Administered 2024-10-19: 20 mg via INTRAVENOUS
  Administered 2024-10-19: 10 mg via INTRAVENOUS

## 2024-10-19 MED ORDER — SENNOSIDES-DOCUSATE SODIUM 8.6-50 MG PO TABS
1.0000 | ORAL_TABLET | Freq: Every day | ORAL | Status: DC
Start: 2024-10-19 — End: 2024-10-20
  Filled 2024-10-19: qty 1

## 2024-10-19 MED ORDER — GABAPENTIN 300 MG PO CAPS
300.0000 mg | ORAL_CAPSULE | Freq: Two times a day (BID) | ORAL | Status: DC
  Administered 2024-10-19 – 2024-10-20 (×2): 300 mg via ORAL
  Filled 2024-10-19 (×2): qty 1

## 2024-10-19 MED ORDER — KETOROLAC TROMETHAMINE 30 MG/ML IJ SOLN
15.0000 mg | Freq: Four times a day (QID) | INTRAMUSCULAR | Status: DC
  Administered 2024-10-19 – 2024-10-20 (×3): 15 mg via INTRAVENOUS
  Filled 2024-10-19 (×3): qty 1

## 2024-10-19 MED ORDER — FENTANYL CITRATE (PF) 250 MCG/5ML IJ SOLN
INTRAMUSCULAR | Status: DC | PRN
  Administered 2024-10-19: 100 ug via INTRAVENOUS
  Administered 2024-10-19 (×2): 50 ug via INTRAVENOUS

## 2024-10-19 MED ORDER — ONDANSETRON HCL 4 MG/2ML IJ SOLN: 4.0000 mg | Freq: Four times a day (QID) | INTRAMUSCULAR | Status: DC | PRN

## 2024-10-19 MED ORDER — LIDOCAINE 2% (20 MG/ML) 5 ML SYRINGE
INTRAMUSCULAR | Status: DC | PRN
  Administered 2024-10-19: 40 mg via INTRAVENOUS

## 2024-10-19 MED ORDER — HYDROMORPHONE HCL 1 MG/ML IJ SOLN
INTRAMUSCULAR | Status: DC | PRN
  Administered 2024-10-19: .5 mg via INTRAVENOUS

## 2024-10-19 MED ORDER — KETOROLAC TROMETHAMINE 30 MG/ML IJ SOLN
INTRAMUSCULAR | Status: AC
  Filled 2024-10-19: qty 1

## 2024-10-19 MED ORDER — FENTANYL CITRATE (PF) 250 MCG/5ML IJ SOLN
INTRAMUSCULAR | Status: AC
  Filled 2024-10-19: qty 5

## 2024-10-19 MED ORDER — CHLORHEXIDINE GLUCONATE 0.12 % MT SOLN
15.0000 mL | Freq: Once | OROMUCOSAL | Status: AC
  Administered 2024-10-19: 15 mL via OROMUCOSAL
  Filled 2024-10-19: qty 15

## 2024-10-19 MED ORDER — PROPOFOL 500 MG/50ML IV EMUL
INTRAVENOUS | Status: DC | PRN
  Administered 2024-10-19: 40 ug/kg/min via INTRAVENOUS

## 2024-10-19 MED ORDER — OXYCODONE HCL 5 MG/5ML PO SOLN: 5.0000 mg | Freq: Once | ORAL | Status: DC | PRN

## 2024-10-19 MED ORDER — ACETAMINOPHEN 500 MG PO TABS
500.0000 mg | ORAL_TABLET | Freq: Once | ORAL | Status: AC
  Administered 2024-10-19: 500 mg via ORAL
  Filled 2024-10-19: qty 1

## 2024-10-19 MED ORDER — POVIDONE-IODINE 10 % EX SWAB
2.0000 | Freq: Once | CUTANEOUS | Status: AC
  Administered 2024-10-19: 2 via TOPICAL

## 2024-10-19 MED ORDER — LACTATED RINGERS IV SOLN: INTRAVENOUS | Status: DC

## 2024-10-19 MED ORDER — MIDAZOLAM HCL 2 MG/2ML IJ SOLN
INTRAMUSCULAR | Status: AC
  Filled 2024-10-19: qty 2

## 2024-10-19 MED ORDER — OXYCODONE HCL 5 MG PO TABS: 5.0000 mg | ORAL_TABLET | Freq: Once | ORAL | Status: DC | PRN

## 2024-10-19 MED ORDER — KETOROLAC TROMETHAMINE 30 MG/ML IJ SOLN
INTRAMUSCULAR | Status: DC | PRN
  Administered 2024-10-19: 15 mg via INTRAVENOUS

## 2024-10-19 MED ORDER — ONDANSETRON HCL 4 MG/2ML IJ SOLN
INTRAMUSCULAR | Status: DC | PRN
  Administered 2024-10-19: 4 mg via INTRAVENOUS

## 2024-10-19 MED ORDER — DEXAMETHASONE SOD PHOSPHATE PF 10 MG/ML IJ SOLN
INTRAMUSCULAR | Status: DC | PRN
  Administered 2024-10-19: 10 mg via INTRAVENOUS

## 2024-10-19 MED ORDER — DEXMEDETOMIDINE HCL IN NACL 80 MCG/20ML IV SOLN
INTRAVENOUS | Status: AC
Start: 2024-10-19 — End: 2024-10-19
  Filled 2024-10-19: qty 20

## 2024-10-19 MED ORDER — EPHEDRINE SULFATE-NACL 50-0.9 MG/10ML-% IV SOSY
PREFILLED_SYRINGE | INTRAVENOUS | Status: DC | PRN
  Administered 2024-10-19: 7.5 mg via INTRAVENOUS

## 2024-10-19 MED ORDER — FLUORESCEIN SODIUM 10 % IV SOLN
INTRAVENOUS | Status: AC
Start: 2024-10-19 — End: 2024-10-19
  Filled 2024-10-19: qty 5

## 2024-10-19 MED ORDER — PANTOPRAZOLE SODIUM 40 MG PO TBEC: 40.0000 mg | DELAYED_RELEASE_TABLET | Freq: Every day | ORAL | Status: DC | PRN

## 2024-10-19 MED ORDER — MIDAZOLAM HCL (PF) 2 MG/2ML IJ SOLN
INTRAMUSCULAR | Status: DC | PRN
  Administered 2024-10-19: 2 mg via INTRAVENOUS

## 2024-10-19 MED ORDER — HYDROMORPHONE HCL 1 MG/ML IJ SOLN
INTRAMUSCULAR | Status: AC
  Filled 2024-10-19: qty 0.5

## 2024-10-19 MED ORDER — CEFAZOLIN SODIUM-DEXTROSE 2-4 GM/100ML-% IV SOLN
2.0000 g | INTRAVENOUS | Status: AC
Start: 2024-10-19 — End: 2024-10-19
  Administered 2024-10-19: 2 g via INTRAVENOUS
  Filled 2024-10-19: qty 100

## 2024-10-19 MED ORDER — FENTANYL CITRATE (PF) 100 MCG/2ML IJ SOLN
INTRAMUSCULAR | Status: AC
  Filled 2024-10-19: qty 2

## 2024-10-19 MED ORDER — SIMETHICONE 80 MG PO CHEW
80.0000 mg | CHEWABLE_TABLET | Freq: Three times a day (TID) | ORAL | Status: DC
  Administered 2024-10-19 – 2024-10-20 (×2): 80 mg via ORAL
  Filled 2024-10-19 (×2): qty 1

## 2024-10-19 MED ORDER — LACTATED RINGERS IV SOLN
INTRAVENOUS | Status: DC
Start: 2024-10-19 — End: 2024-10-19

## 2024-10-19 MED ORDER — PHENYLEPHRINE HCL-NACL 20-0.9 MG/250ML-% IV SOLN
INTRAVENOUS | Status: DC | PRN
  Administered 2024-10-19: 25 ug/min via INTRAVENOUS

## 2024-10-19 MED ORDER — 0.9 % SODIUM CHLORIDE (POUR BTL) OPTIME
TOPICAL | Status: DC | PRN
  Administered 2024-10-19: 1000 mL

## 2024-10-19 MED ORDER — BUPIVACAINE HCL 0.5 % IJ SOLN
INTRAMUSCULAR | Status: DC | PRN
  Administered 2024-10-19: 10 mL

## 2024-10-19 MED ORDER — DEXMEDETOMIDINE HCL IN NACL 80 MCG/20ML IV SOLN
INTRAVENOUS | Status: DC | PRN
  Administered 2024-10-19: 12 ug via INTRAVENOUS

## 2024-10-19 MED ORDER — SUGAMMADEX SODIUM 200 MG/2ML IV SOLN
INTRAVENOUS | Status: DC | PRN
  Administered 2024-10-19: 100 mg via INTRAVENOUS

## 2024-10-19 MED ORDER — DROPERIDOL 2.5 MG/ML IJ SOLN: 0.6250 mg | Freq: Once | INTRAMUSCULAR | Status: DC | PRN

## 2024-10-19 MED ORDER — ONDANSETRON HCL 4 MG PO TABS: 4.0000 mg | ORAL_TABLET | Freq: Four times a day (QID) | ORAL | Status: DC | PRN

## 2024-10-19 MED ORDER — IBUPROFEN 600 MG PO TABS: 600.0000 mg | ORAL_TABLET | Freq: Four times a day (QID) | ORAL | Status: DC

## 2024-10-19 MED ORDER — FLUORESCEIN SODIUM 10 % IV SOLN
INTRAVENOUS | Status: DC | PRN
  Administered 2024-10-19: .25 mL via INTRAVENOUS

## 2024-10-19 MED ORDER — PROPOFOL 10 MG/ML IV BOLUS
INTRAVENOUS | Status: DC | PRN
  Administered 2024-10-19: 120 mg via INTRAVENOUS

## 2024-10-19 MED ORDER — FENTANYL CITRATE (PF) 100 MCG/2ML IJ SOLN
25.0000 ug | INTRAMUSCULAR | Status: DC | PRN
  Administered 2024-10-19: 50 ug via INTRAVENOUS

## 2024-10-19 MED ORDER — OXYCODONE HCL 5 MG PO TABS
5.0000 mg | ORAL_TABLET | ORAL | Status: DC | PRN
  Administered 2024-10-19 – 2024-10-20 (×3): 10 mg via ORAL
  Filled 2024-10-19 (×3): qty 2

## 2024-10-19 SURGICAL SUPPLY — 52 items
BARRIER ADHS 3X4 INTERCEED (GAUZE/BANDAGES/DRESSINGS) IMPLANT
BLADE SURG 15 STRL LF DISP TIS (BLADE) ×2 IMPLANT
COVER MAYO STAND STRL (DRAPES) ×2 IMPLANT
DEFOGGER SCOPE WARM SEASHARP (MISCELLANEOUS) ×2 IMPLANT
DERMABOND ADVANCED .7 DNX12 (GAUZE/BANDAGES/DRESSINGS) ×2 IMPLANT
DEVICE SUTURE ENDOST 10MM (ENDOMECHANICALS) IMPLANT
DILATOR CANAL MILEX (MISCELLANEOUS) IMPLANT
DRAPE SURG IRRIG POUCH 19X23 (DRAPES) ×2 IMPLANT
DURAPREP 26ML APPLICATOR (WOUND CARE) ×2 IMPLANT
ELECTRODE REM PT RTRN 9FT ADLT (ELECTROSURGICAL) ×2 IMPLANT
GLOVE BIOGEL PI IND STRL 6.5 (GLOVE) IMPLANT
GLOVE BIOGEL PI IND STRL 7.5 (GLOVE) ×4 IMPLANT
GLOVE ECLIPSE 6.5 STRL STRAW (GLOVE) IMPLANT
GLOVE SURG SS PI 6.5 STRL IVOR (GLOVE) IMPLANT
GLOVE SURG SS PI 7.0 STRL IVOR (GLOVE) ×4 IMPLANT
GOWN STRL REUS W/ TWL LRG LVL3 (GOWN DISPOSABLE) ×4 IMPLANT
GOWN STRL REUS W/ TWL XL LVL3 (GOWN DISPOSABLE) ×4 IMPLANT
HIBICLENS CHG 4% 4OZ BTL (MISCELLANEOUS) ×2 IMPLANT
IRRIGATION SUCT STRKRFLW 2 WTP (MISCELLANEOUS) ×2 IMPLANT
KIT PINK PAD W/HEAD ARM REST (MISCELLANEOUS) ×4 IMPLANT
KIT TURNOVER KIT B (KITS) ×2 IMPLANT
LHOOK LAP DISP 36CM (ELECTROSURGICAL) IMPLANT
LIGASURE VESSEL 5MM BLUNT TIP (ELECTROSURGICAL) IMPLANT
MANIFOLD NEPTUNE II (INSTRUMENTS) ×2 IMPLANT
MANIPULATOR VCARE STD CRV RETR (MISCELLANEOUS) IMPLANT
OCCLUDER COLPOPNEUMO (BALLOONS) ×2 IMPLANT
PACK LAPAROSCOPY BASIN (CUSTOM PROCEDURE TRAY) ×2 IMPLANT
PAD ARMBOARD POSITIONER FOAM (MISCELLANEOUS) ×4 IMPLANT
PAD OB MATERNITY 11 LF (PERSONAL CARE ITEMS) ×2 IMPLANT
PENCIL SMOKE EVACUATOR (MISCELLANEOUS) ×2 IMPLANT
POUCH LAPAROSCOPIC INSTRUMENT (MISCELLANEOUS) ×2 IMPLANT
POWDER SURGICEL 3.0 GRAM (HEMOSTASIS) IMPLANT
SCISSORS LAP 5X35 DISP (ENDOMECHANICALS) IMPLANT
SET IRRIG Y TYPE TUR BLADDER L (SET/KITS/TRAYS/PACK) ×2 IMPLANT
SET TUBE SMOKE EVAC HIGH FLOW (TUBING) ×2 IMPLANT
SOLUTION ELECTROSURG ANTI STCK (MISCELLANEOUS) IMPLANT
SOUND UTERINE 9.75 DISP STRL (MISCELLANEOUS) IMPLANT
SUT ENDO VLOC 180-0-8IN (SUTURE) IMPLANT
SUT MNCRL AB 4-0 PS2 18 (SUTURE) IMPLANT
SUT VICRYL 0 UR6 27IN ABS (SUTURE) IMPLANT
SUT VLOC 180 0 9IN GS21 (SUTURE) IMPLANT
SYR 10ML LL (SYRINGE) ×2 IMPLANT
SYR 50ML LL SCALE MARK (SYRINGE) ×2 IMPLANT
SYR TOOMEY 50ML (SYRINGE) IMPLANT
SYSTEM CARTER THOMASON II (TROCAR) IMPLANT
TIP ENDOSCOPIC SURGICEL (TIP) IMPLANT
TOWEL GREEN STERILE FF (TOWEL DISPOSABLE) ×2 IMPLANT
TRAY FOLEY W/BAG SLVR 16FR ST (SET/KITS/TRAYS/PACK) ×2 IMPLANT
TROCAR 11X100 Z THREAD (TROCAR) IMPLANT
TROCAR ADV FIXATION 5X100MM (TROCAR) IMPLANT
TROCAR BALLN 12MMX100 BLUNT (TROCAR) IMPLANT
UNDERPAD 30X36 HEAVY ABSORB (UNDERPADS AND DIAPERS) ×2 IMPLANT

## 2024-10-19 NOTE — Op Note (Signed)
 Operative Note   10/19/2024  PRE-OP DIAGNOSIS *Cervical stenosis *AUB *Chronic pelvic pain   POST-OP DIAGNOSIS *Same   SURGEON: Surgeons and Role:    * Izell Harari, MD - Primary  ASSISTANT:    DEWAINE Cleotilde Ronal GORMAN, MD - Assisting  An experienced assistant was required given the standard of surgical care given the complexity of the case.  This assistant was needed for exposure, dissection, suctioning, retraction, instrument exchange, and for overall help during the procedure.  PROCEDURE: Total laparoscopic hysterectomy, bilateral salpingectomy, cystoscopy  ANESTHESIA: General and local  ESTIMATED BLOOD LOSS:  DRAINS: indwelling foley UOP   TOTAL IV FLUIDS: crystalloid  VTE PROPHYLAXIS: SCDs to the bilateral lower extremities  ANTIBIOTICS: Two grams of Cefazolin  were given. within 1 hour of skin incision  SPECIMENS: cervix, uterus, bilateral tubes  DISPOSITION: PACU - hemodynamically stable.  CONDITION: stable  COMPLICATIONS: None  FINDINGS: mild to moderate adhesions in the area of the bladder and no other intra-abdominal adhesions noted. Grossly normal ovaries, tubes (surgically missing mid segements), uterus and cervix with normal stomach and liver edge. On cystoscopy, normal bladder with +efflux of dyed urine. EGBUS, vagina and cervix within normal limits with cervical stenosis noted.   DESCRIPTION OF PROCEDURE: After informed consent was obtained, the patient was taken to the operating room where anesthesia was obtained without difficulty. The patient was positioned in the dorsal lithotomy position in [place] stirrups and her arms were carefully tucked at her sides and the usual precautions were taken.  She was prepped and draped in normal sterile fashion.  Using the open technique, the abdomen was entered at the umbilicus. The balloon trocar was placed, pneumoperitoneum achieved and the laparoscope introduced with no injury noted below. The patient was  placed in Trendelenburg and 5mm right and LLQ ports. Next, a foley was placed, and a standard VCare uterine manipulator was then placed in the uterus without incident. Gloves were then changed, and an 11 mm suprapubic port was placed.   The bilateral tubes were then cauterized, transected and separated from the ovaries, with the Ligasure device and removed. The bilateral cornua were then cauterized with the Ligasure device, and the round ligaments were divided on each side with the monopolar scissors and the retroperitoneal space was opened bilaterally.  The ureters were identified and preserved.  A bladder flap was created and the bladder was dissected down off the lower uterine segment and cervix using endoshears and the Ligasure.  The broad ligaments were then opened, and the uterine arteries were skeletonized bilaterally, sealed and divided with the Ligasure device.  A colpotomy was performed circumferentially along the ring with electrocautery and the cervix was incised from the vagina and the specimen was removed through the vagina.  A pneumo balloon was placed in the vagina and the vaginal cuff was then closed in a running continuous fashion using technique with a 0 V-Lock suture with careful attention to include the vaginal cuff angles and the vaginal mucosa within the closure.  Surgicel powder was then applied to the cuff.  The cystoscopy was then performed, with the above noted findings. The intraperitoneal pressure was dropped, and all planes of dissection, vascular pedicles and the vaginal cuff were found to be hemostatic on low pressure.    The suprapubic trocar was removed and the fascia was closed with 0 Vicryl suture using the [employer] II.  The lateral trocars were removed under visualization.   Before the umbilical trocar was removed the CO2  gas was released.  The fascia there was closed with 0 vicryl suture in a figure of eight fashion.   The skin incision at the umbilicus was closed with  a subcuticular stitch of 4-0 monocryl.  The remaining skin incisions were closed with Dermabond glue.  The patient tolerated the procedure well.  Sponge, lap and needle counts were correct x2.  The patient was taken to recovery room in excellent condition.  Bebe Izell Raddle MD Attending [facility] for [employer] [facility])

## 2024-10-19 NOTE — Transfer of Care (Signed)
 Immediate Anesthesia Transfer of Care Note  Patient: Kim [name]  Procedure(s) Performed: HYSTERECTOMY, TOTAL, LAPAROSCOPIC, WITH SALPINGECTOMY (Bilateral) CYSTOSCOPY  Patient Location: PACU  Anesthesia Type:General  Level of Consciousness: awake  Airway & Oxygen Therapy: Patient Spontanous Breathing  Post-op Assessment: Report given to RN and Post -op Vital signs reviewed and stable  Post vital signs: Reviewed and stable  Last Vitals:  Vitals Value Taken Time  BP 125/65 10/19/24 18:01  Temp    Pulse 97 10/19/24 18:04  Resp 13 10/19/24 18:04  SpO2 99 % 10/19/24 18:04  Vitals shown include unfiled device data.  Last Pain:  Vitals:   10/19/24 1041  TempSrc:   PainSc: 0-No pain         Complications: No notable events documented.

## 2024-10-19 NOTE — Brief Op Note (Signed)
 10/19/2024  5:45 PM  PATIENT:  Harlene SAUNDERS Lemler  34 y.o. female  PRE-OPERATIVE DIAGNOSIS:  Cervical stenosis AUB pelvic pain+  POST-OPERATIVE DIAGNOSIS:  Cervical stenosis AUBpelvic pain  PROCEDURE:  TLH/BS/cysto  SURGEON:  Surgeons and Role:    * Izell Harari, MD - Primary   ASSISTANTS:    DEWAINE Cleotilde Ronal GORMAN, MD - Assisting   ANESTHESIA:   local and general  EBL:   IVF: crystalloid   BLOOD ADMINISTERED:none  DRAINS: indwelling foley UOP clear with fluorescein   LOCAL MEDICATIONS USED:  MARCAINE      SPECIMEN:  cervix, uterus, tubal remnants bilaterally  DISPOSITION OF SPECIMEN:  PATHOLOGY  COUNTS:  YES  TOURNIQUET:  * No tourniquets in log *  DICTATION: .Note written in EPIC  PLAN OF CARE: Admit for overnight observation  PATIENT DISPOSITION:  PACU - hemodynamically stable.   Delay start of Pharmacological VTE agent (>24hrs) due to surgical blood loss or risk of bleeding: not applicable  Harari Izell Raddle MD Attending [facility] [employer] (Faculty Practice) 10/19/2024 Time: [phone]

## 2024-10-19 NOTE — Discharge Instructions (Signed)
Laparoscopic Surgery Discharge Instructions  Instructions Following Major Laparoscopic Surgery You have just undergone a major laparoscopic surgery.  The following list should answer your most common questions.  Although we will discuss your surgery and post-operative instructions with you prior to your discharge, this list will serve as a reminder if you fail to recall the details of what we discussed.  We will discuss your surgery once again in detail at your post-op visit in two to four weeks. If you haven't already done so, please call to make your appointment as soon as possible.  How you will feel: Although you have just undergone a major surgery, your recovery will be significantly shorter since the surgery was performed through much smaller incisions than the traditional approach.  You should feel slightly better each day.  If you suddenly feel much worse than the prior day, please call the clinic.  It's important during the early part of your recovery that you maintain some activity.  Walking is encouraged.  You will quicken your recovery by continued activity.  Incision:  Your incisions will be closed with dissolvable stitches or surgical adhesive (glue).  There may be Band-aids and/or Steri-strips covering your incisions.  If there is no drainage from the incisions you may remove the Band-aids in one to two days.  You may notice some minor bruising at the incision sites.  This is common and will resolve within several days.  Please inform us if the redness at the edges of your incision appears to be spreading.  If the skin around your incision becomes warm to the touch, or if you notice a pus-like drainage, please call the office.  Vaginal Discharge Following a Laparoscopic Hysterectomy: Minor vaginal bleeding or spotting is normal following a hysterectomy.  Bleeding similar to the amount of your period is excessive, and you should inform us of this immediately.  Vaginal spotting may continue  for several weeks following your surgery.  You may notice a yellowish discharge which occasionally occurs as the vaginal stitches dissolve, and may last for several weeks.  Sexual Activity Following a Hysterectomy: Do not have sexual intercourse or place tampons or douches in the vagina prior to your first office visit.  We will discuss when you may resume these activities at that visit.    Stairs/Driving/Activities: You may climb stairs if necessary.  If you've had general anesthesia, do not drive a car the rest of the day today.  You may begin light housework when you feel up to it, but avoid heavy lifting (more than 15-20lbs) or pushing until cleared for these activities by your physician.  Hygiene:  Do not soak your incisions.  Showers are acceptable but you may not take a bath or swim in a pool.  Cleanse your incisions daily with soap and water.  Medications:  Please resume taking any medications that you were taking prior to the surgery.  If we have prescribed any new medications for you, please take them as directed.  Constipation:  It is fairly common to experience some difficulty in moving your bowels following major surgery.  Being active will help to reduce this likelihood. A diet rich in fiber and plenty of liquids is desirable.  If you do become constipated, a mild laxative such as Miralax, Milk of Magnesia, or Metamucil, or a stool softener such as Colace, is recommended.  General Instructions: If you develop a fever of 100.5 degrees or higher, please call the office number(s) below for physician on call.   

## 2024-10-19 NOTE — Anesthesia Preprocedure Evaluation (Addendum)
 Anesthesia Evaluation  Patient identified by MRN, date of birth, ID band Patient awake    Reviewed: Allergy & Precautions, H&P , NPO status , Patient's Chart, lab work & pertinent test results  History of Anesthesia Complications Negative for: history of anesthetic complications  Airway Mallampati: II  TM Distance: >3 FB Neck ROM: Full    Dental  (+) Edentulous Upper, Edentulous Lower   Pulmonary asthma , Current Smoker and Patient abstained from smoking.   Pulmonary exam normal breath sounds clear to auscultation       Cardiovascular negative cardio ROS Normal cardiovascular exam Rhythm:Regular Rate:Normal     Neuro/Psych Seizures -, Well Controlled,  PSYCHIATRIC DISORDERS Anxiety Depression Bipolar Disorder      GI/Hepatic negative GI ROS, Neg liver ROS,,,  Endo/Other  diabetes    Renal/GU negative Renal ROS   AUB    Musculoskeletal  (+)  Fibromyalgia -  Abdominal   Peds negative pediatric ROS (+)  Hematology negative hematology ROS (+)   Anesthesia Other Findings   Reproductive/Obstetrics negative OB ROS                              Anesthesia Physical Anesthesia Plan  ASA: 2  Anesthesia Plan: General   Post-op Pain Management: Tylenol  PO (pre-op)*   Induction: Intravenous  PONV Risk Score and Plan: 2 and Scopolamine  patch - Pre-op, Ondansetron , Dexamethasone  and Treatment may vary due to age or medical condition  Airway Management Planned: Oral ETT  Additional Equipment: None  Intra-op Plan:   Post-operative Plan: Extubation in OR  Informed Consent: I have reviewed the patients History and Physical, chart, labs and discussed the procedure including the risks, benefits and alternatives for the proposed anesthesia with the patient or authorized representative who has indicated his/her understanding and acceptance.     Dental advisory given  Plan Discussed with:  CRNA  Anesthesia Plan Comments:          Anesthesia Quick Evaluation

## 2024-10-19 NOTE — H&P (Signed)
 Obstetrics & Gynecology Surgical H&P   Date of Surgery: 10/19/2024   Primary OBGYN: Center for [facility]  Reason for Admission: scheduled surgery for TLH/BS/cysto for AUB, cramping, cervical stenosis.    History of Present Illness: Ms. Cowell is a 34 y.o. [id number] (Patient's last menstrual period was 09/27/2024 (exact date).), with the above chief complaint. Past medical history is significant for h/o c-section x 4 with BTL and h/o appendectomy, cervical stenosis, h/o opiate addiction, tobacco abuse, asthma.   Patient had EUA with uterine perforation with dilation on 7/29 with hysteroscopy, d&c, mirena  IUD placement due to AUB, cervical stenosis. After that, d/w pt and she elected for definitive treatment with hyst  Interval History Patient last seen 05/11/2024. At that time, she was still noting intermittent cramping throughout the week but no AUB and she had a short period for a day earlier that month. D/w her and she was amenable to hysteroscopy, d&c, as well as mirena  placement due to her h/o amenorrhea with it.   Patient initially seen on 3/24 after referral for AUB and abnormal imaging.    Patient seen late October 2024 for abdominal pain and diagnosed with appendicitis and underwent a l/s appy. CT showed possible pelvic congestion based on prominent vasculature; October 2023 CT wnl.    She also stated  she's had AUB for the past six months along with increased cramping. Prior to this, her periods were qmonth and regular. She'll have two periods a month that can be 3 days to a week. Pap and STI swab testing negative earlier this year.    Plan at that time was u/s and follow up. U/s on 4/11 showed no e/o congestion syndrome with a 6x2x60mm endometrial cyst at the LUS/area of the c/s incision with no overt vascularity noted. At her 4/21 f/u, EMBx attempt done with severe cervical stenosis noted and unable to enter into the uterine cavity and patient got vagal.   ROS: A  12-point review of systems was performed and negative, except as stated in the above HPI.  OBGYN History: As per HPI. OB History  Gravida Para Term Preterm AB Living  5 4 4  1 4   SAB IAB Ectopic Multiple Live Births  1   0 4    # Outcome Date GA Lbr Len/2nd Weight Sex Type Anes PTL Lv  5 Term 09/05/16 [redacted]w[redacted]d  3125 g F CS-LTranv Spinal  LIV  4 Term 07/03/15 [redacted]w[redacted]d  3118 g M CS-LTranv Spinal  LIV     Birth Comments: Hgb, Normal, FA Newborn Screen Barcode: [id number] Date Collected: 07/04/2015  3 Term 02/04/11    F CS-LTranv   LIV     Complications: Pre-eclampsia  2 Term 01/12/09    M CS-LTranv EPI N LIV     Complications: Fetal Intolerance  1 SAB            History of pap smears: Yes. Last pap smear 01/2024 negative and HPV negative   Past Medical History: Past Medical History:  Diagnosis Date   Acute appendicitis 10/22/2023   Anemia    Anxiety    Asthma    Chest pain    Depression    Fibromyalgia    Gestational diabetes 2010, 2012   H/O pre-eclampsia in prior pregnancy, currently pregnant 12/14/2014   Baby ASA--baseline labs--     Heartburn during pregnancy    Ileus, postoperative (HCC) 09/10/2016   Rectal bleeding    Seizures (HCC) 2015   x 1- unknown reason-  passed out   Ulcer     Past Surgical History: Past Surgical History:  Procedure Laterality Date   CESAREAN SECTION     x 2   CESAREAN SECTION WITH BILATERAL TUBAL LIGATION Bilateral 07/03/2015   Procedure: REPEAT CESAREAN SECTION ;  Surgeon: Gloris DELENA Hugger, MD;  Location: [facility];  Service: Obstetrics;  Laterality: Bilateral;   CESAREAN SECTION WITH BILATERAL TUBAL LIGATION Bilateral 09/05/2016   Procedure: REPEAT CESAREAN SECTION WITH BILATERAL TUBAL LIGATION;  Surgeon: Bebe Furry, MD;  Location: [facility];  Service: Obstetrics;  Laterality: Bilateral;   CHOLECYSTECTOMY     COLONOSCOPY  04/16/2008   HYSTEROSCOPY WITH D & C N/A 07/28/2024   Procedure: Exam under Anesthesa cervical dilitation;   Surgeon: Furry Bebe, MD;  Location: [facility] OR;  Service: Gynecology;  Laterality: N/A;  Hysteroscopy, Possible Myosure polypectomy   LAPAROSCOPIC APPENDECTOMY N/A 10/22/2023   Procedure: APPENDECTOMY LAPAROSCOPIC;  Surgeon: Stechschulte, Deward PARAS, MD;  Location: [facility] OR;  Service: General;  Laterality: N/A;   WISDOM TOOTH EXTRACTION      Family History:  Family History  Problem Relation Age of Onset   Depression Mother    Fibromyalgia Mother    Cancer Mother        breast   Ovarian cancer Mother    Diabetes Maternal Grandmother    Hypertension Maternal Grandmother    Arthritis Maternal Grandmother    Thyroid disease Maternal Grandmother    Colon polyps Maternal Grandmother    Irritable bowel syndrome Maternal Grandmother    Social History:  Social History   Socioeconomic History   Marital status: Single    Spouse name: Not on file   Number of children: Not on file   Years of education: Not on file   Highest education level: Some college, no degree  Occupational History   Not on file  Tobacco Use   Smoking status: Every Day    Current packs/day: 0.50    Types: Cigarettes    Passive exposure: Current   Smokeless tobacco: Former    Quit date: 08/24/2014  Vaping Use   Vaping status: Never Used  Substance and Sexual Activity   Alcohol use: No   Drug use: Yes    Types: Marijuana    Comment: weekly   Sexual activity: Yes    Birth control/protection: None  Other Topics Concern   Not on file  Social History Narrative   Not on file   Social Drivers of Health   Financial Resource Strain: Low Risk  (01/28/2024)   Overall Financial Resource Strain (CARDIA)    Difficulty of Paying Living Expenses: Not very hard  Food Insecurity: No Food Insecurity (01/28/2024)   Hunger Vital Sign    Worried About Running Out of Food in the Last Year: Never true    Ran Out of Food in the Last Year: Never true  Transportation Needs: No Transportation Needs (01/28/2024)   PRAPARE -  Administrator, Civil Service (Medical): No    Lack of Transportation (Non-Medical): No  Physical Activity: Sufficiently Active (01/28/2024)   Exercise Vital Sign    Days of Exercise per Week: 4 days    Minutes of Exercise per Session: 100 min  Stress: Stress Concern Present (01/28/2024)   [employer] of Occupational Health - Occupational Stress Questionnaire    Feeling of Stress : To some extent  Social Connections: Moderately Isolated (01/28/2024)   Social Connection and Isolation Panel    Frequency of Communication with Friends  and Family: Three times a week    Frequency of Social Gatherings with Friends and Family: Once a week    Attends Religious Services: Never    Database administrator or Organizations: No    Attends Engineer, structural: Not on file    Marital Status: Living with partner  Intimate Partner Violence: Not At Risk (10/22/2023)   Humiliation, Afraid, Rape, and Kick questionnaire    Fear of Current or Ex-Partner: No    Emotionally Abused: No    Physically Abused: No    Sexually Abused: No    Allergy: No Known Allergies  Current Outpatient Medications: Current Facility-Administered Medications  Medication Dose Route Frequency Provider Last Rate Last Admin   ceFAZolin  (ANCEF ) IVPB 2g/100 mL premix  2 g Intravenous On Call to OR Izell Harari, MD       lactated ringers  infusion   Intravenous Continuous Izell Harari, MD       lactated ringers  infusion   Intravenous Continuous Erma Thom SAUNDERS, MD 10 mL/hr at 10/19/24 1044 New Bag at 10/19/24 1044   scopolamine  (TRANSDERM-SCOP) 1 MG/3DAYS 1 mg  1 patch Transdermal Q72H Treen, Jon R, MD   1 mg at 10/19/24 1043     Physical Exam:  Current Vital Signs 24h Vital Sign Ranges  T 98.2 F (36.8 C) Temp  Avg: 98.2 F (36.8 C)  Min: 98.2 F (36.8 C)  Max: 98.2 F (36.8 C)  BP (!) 106/58 BP  Min: 106/58  Max: 106/58  HR (!) 56 Pulse  Avg: 56  Min: 56  Max: 56  RR 18 Resp  Avg: 18  Min: 18   Max: 18  SaO2 99 % Room Air SpO2  Avg: 99 %  Min: 99 %  Max: 99 %       24 Hour I/O Current Shift I/O  Time Ins Outs No intake/output data recorded. No intake/output data recorded.   Body mass index is 15.81 kg/m. General appearance: Well nourished, well developed female in no acute distress.  Cardiovascular: S1, S2 normal, no murmur, rub or gallop, regular rate and rhythm Respiratory:  Clear to auscultation bilateral. Normal respiratory effort Abdomen: positive bowel sounds and no masses, hernias; diffusely non tender to palpation, non distended Neuro/Psych:  Normal mood and affect.  Skin:  Warm and dry.  Extremities: no clubbing, cyanosis, or edema.     Laboratory: No new labs  Imaging:  Narrative & Impression  CLINICAL DATA:  Abnormal uterine bleeding.   EXAM: TRANSABDOMINAL AND TRANSVAGINAL ULTRASOUND OF PELVIS   TECHNIQUE: Both transabdominal and transvaginal ultrasound examinations of the pelvis were performed. Transabdominal technique was performed for global imaging of the pelvis including uterus, ovaries, adnexal regions, and pelvic cul-de-sac. It was necessary to proceed with endovaginal exam following the transabdominal exam to visualize the endometrium, uterus and ovaries.   COMPARISON:  CT abdomen pelvis October 22, 2023   FINDINGS: Uterus   Measurements: 8.8 x 3.9 x 5.5 cm = volume: 99.0 mL. No fibroids or other mass visualized.   Endometrium   Thickness: 4.1 mm. There is a 6 x 2 x 8 mm cyst in the endometrium.   Right ovary   Measurements: 3.2 x 1.5 x 2.6 cm = volume: 6.2 mL. Normal appearance/no adnexal mass.   Left ovary   Measurements: 2.8 x 1.1 x 2 cm = volume: 3.2 mL. Normal appearance/no adnexal mass.   Other findings   No abnormal free fluid. Nabothian cysts are identified within the cervix.  IMPRESSION: 1. No acute abnormality identified. 2. 6 x 2 x 8 mm cyst in the endometrium. This is nonspecific but can be seen in the  setting of endometrial hyperplasia.     Electronically Signed   By: Craig Farr M.D.   On: 04/10/2024 08:56   Assessment: Ms. Sherley is a 34 y.o. [id number] (Patient's last menstrual period was 09/27/2024 (exact date).) here for scheduled surgery; pt stable  Plan: D/w pt and still amenable to TLH/BS/cysto/all indicated such as LAVH, TAH d/w her. Patient amenable to stay overnight   Bebe Izell Overcast MD Attending Center for [facility])

## 2024-10-19 NOTE — Anesthesia Postprocedure Evaluation (Signed)
 Anesthesia Post Note  Patient: Kim [name]  Procedure(s) Performed: HYSTERECTOMY, TOTAL, LAPAROSCOPIC, WITH SALPINGECTOMY (Bilateral) CYSTOSCOPY     Patient location during evaluation: PACU Anesthesia Type: General Level of consciousness: awake and alert and oriented Pain management: pain level controlled Vital Signs Assessment: post-procedure vital signs reviewed and stable Respiratory status: spontaneous breathing, nonlabored ventilation and respiratory function stable Cardiovascular status: blood pressure returned to baseline and stable Postop Assessment: no apparent nausea or vomiting Anesthetic complications: no   No notable events documented.  Last Vitals:  Vitals:   10/19/24 1800 10/19/24 1815  BP: 125/65 114/65  Pulse: (!) 122 83  Resp: 10 (!) 8  Temp: 36.7 C   SpO2: 100% 98%    Last Pain:  Vitals:   10/19/24 1815  TempSrc:   PainSc: 4    Pain Goal:                   Bernita Beckstrom A.

## 2024-10-20 DIAGNOSIS — N882 Stricture and stenosis of cervix uteri: Secondary | ICD-10-CM | POA: Diagnosis not present

## 2024-10-20 DIAGNOSIS — N939 Abnormal uterine and vaginal bleeding, unspecified: Secondary | ICD-10-CM | POA: Diagnosis not present

## 2024-10-20 DIAGNOSIS — J45909 Unspecified asthma, uncomplicated: Secondary | ICD-10-CM | POA: Diagnosis not present

## 2024-10-20 DIAGNOSIS — R102 Pelvic and perineal pain unspecified side: Secondary | ICD-10-CM | POA: Diagnosis not present

## 2024-10-20 MED ORDER — SENNOSIDES-DOCUSATE SODIUM 8.6-50 MG PO TABS: 1.0000 | ORAL_TABLET | Freq: Every evening | ORAL | 0 refills | Status: AC | PRN

## 2024-10-20 MED ORDER — POLYETHYLENE GLYCOL 3350 17 G PO PACK: 17.0000 g | PACK | ORAL | 1 refills | Status: AC

## 2024-10-20 MED ORDER — INFLUENZA VIRUS VACC SPLIT PF (FLUZONE) 0.5 ML IM SUSY: 0.5000 mL | PREFILLED_SYRINGE | INTRAMUSCULAR | Status: DC

## 2024-10-20 MED ORDER — IBUPROFEN 600 MG PO TABS: 600.0000 mg | ORAL_TABLET | Freq: Four times a day (QID) | ORAL | 0 refills | Status: AC | PRN

## 2024-10-20 MED ORDER — OXYCODONE-ACETAMINOPHEN 5-325 MG PO TABS: 1.0000 | ORAL_TABLET | Freq: Four times a day (QID) | ORAL | 0 refills | Status: AC | PRN

## 2024-10-20 MED ORDER — SIMETHICONE 80 MG PO CHEW: CHEWABLE_TABLET | ORAL | 0 refills | Status: AC

## 2024-10-20 MED ORDER — GABAPENTIN 300 MG PO CAPS: 300.0000 mg | ORAL_CAPSULE | Freq: Two times a day (BID) | ORAL | 0 refills | Status: AC

## 2024-10-20 NOTE — Plan of Care (Signed)
  Problem: Education: Goal: Knowledge of General Education information will improve Description: Including pain rating scale, medication(s)/side effects and non-pharmacologic comfort measures Outcome: Progressing   Problem: Clinical Measurements: Goal: Ability to maintain clinical measurements within normal limits will improve Outcome: Progressing   Problem: Activity: Goal: Risk for activity intolerance will decrease Outcome: Progressing   Problem: Nutrition: Goal: Adequate nutrition will be maintained Outcome: Progressing   Problem: Elimination: Goal: Will not experience complications related to bowel motility Outcome: Progressing Goal: Will not experience complications related to urinary retention Outcome: Progressing   Problem: Pain Managment: Goal: General experience of comfort will improve and/or be controlled Outcome: Progressing   Problem: Skin Integrity: Goal: Demonstration of wound healing without infection will improve Outcome: Progressing

## 2024-10-20 NOTE — Progress Notes (Signed)
   10/20/24 0936  Assess: MEWS Score  Temp 97.7 F (36.5 C)  BP (!) 96/55  MAP (mmHg) 67  Pulse Rate (!) 45  Resp 14  Level of Consciousness Alert  SpO2 99 %  O2 Device Room Air  Assess: MEWS Score  MEWS Temp 0  MEWS Systolic 1  MEWS Pulse 1  MEWS RR 0  MEWS LOC 0  MEWS Score 2  MEWS Score Color Yellow  Assess: if the MEWS score is Yellow or Red  Were vital signs accurate and taken at a resting state? Yes  Does the patient meet 2 or more of the SIRS criteria? No  MEWS guidelines implemented  Yes, yellow  Treat  MEWS Interventions Considered administering scheduled or prn medications/treatments as ordered  Take Vital Signs  Increase Vital Sign Frequency  Yellow: Q2hr x1, continue Q4hrs until patient remains green for 12hrs  Escalate  MEWS: Escalate Yellow: Discuss with charge nurse and consider notifying provider and/or RRT  Notify: Charge Nurse/RN  Name of Charge Nurse/RN Notified Daina DASEN, RN  Provider Notification  Provider Name/Title Pickens,MD  Date Provider Notified 10/20/24  Time Provider Notified 0945  Method of Notification Page  Notification Reason Other (Comment) (yellow MEWS)  Provider response Other (Comment) (per MD rechek at 10am)  Date of Provider Response 10/20/24  Time of Provider Response 0945  Assess: SIRS CRITERIA  SIRS Temperature  0  SIRS Respirations  0  SIRS Pulse 0  SIRS WBC 0  SIRS Score Sum  0

## 2024-10-20 NOTE — Progress Notes (Signed)
 Discharge instructions given to pt. Pt verbalized understanding of all teaching and had no further questions. Pt discharged to home with family via wheelchair by volunteer staff.

## 2024-10-20 NOTE — Progress Notes (Signed)
   10/20/24 1010  Vitals  BP (!) 93/46  BP Location Left Arm  BP Method Automatic  Patient Position (if appropriate) Lying  Pulse Rate (!) 45  Pulse Rate Source Dinamap  Resp 16  Level of Consciousness  Level of Consciousness Alert  MEWS COLOR  MEWS Score Color Yellow  Oxygen Therapy  SpO2 99 %  O2 Device Room Air  Pain Assessment  Pain Scale 0-10  Pain Score 4  MEWS Score  MEWS Temp 0  MEWS Systolic 1  MEWS Pulse 1  MEWS RR 0  MEWS LOC 0  MEWS Score 2

## 2024-10-21 ENCOUNTER — Encounter (HOSPITAL_COMMUNITY): Payer: Self-pay | Admitting: Obstetrics and Gynecology

## 2024-10-21 LAB — SURGICAL PATHOLOGY

## 2024-10-22 NOTE — Discharge Summary (Signed)
 Gynecology Discharge Summary Date of Admission: 10/19/2024 Date of Discharge: 10/20/2024  The patient was admitted, as scheduled, and underwent a TLH/BS/cysto for benign indications; please refer to operative note for full details.  She was meeting all post op goals and discharged to home on POD#1. She did have asymptomatic sinus bradycardia, confirmed on ECG, which she's had in the past. Cardiology called and to arrange outpatient follow up  Allergies as of 10/20/2024   No Known Allergies      Medication List     STOP taking these medications    acetaminophen  500 MG tablet Commonly known as: TYLENOL        TAKE these medications    albuterol  108 (90 Base) MCG/ACT inhaler Commonly known as: VENTOLIN  HFA Inhale 2 puffs into the lungs every 6 (six) hours as needed for wheezing or shortness of breath.   aspirin -acetaminophen -caffeine 250-250-65 MG tablet Commonly known as: EXCEDRIN MIGRAINE Take 1-2 tablets by mouth every 6 (six) hours as needed for migraine.   gabapentin  300 MG capsule Commonly known as: NEURONTIN  Take 1 capsule (300 mg total) by mouth 2 (two) times daily for 7 days.   ibuprofen  600 MG tablet Commonly known as: ADVIL  Take 1 tablet (600 mg total) by mouth every 6 (six) hours as needed for mild pain (pain score 1-3) or cramping.   oxyCODONE -acetaminophen  5-325 MG tablet Commonly known as: PERCOCET/ROXICET Take 1-2 tablets by mouth every 6 (six) hours as needed.   polyethylene glycol 17 g packet Commonly known as: MiraLax Take 17 g by mouth every other day for 10 doses.   senna-docusate 8.6-50 MG tablet Commonly known as: Senokot-S Take 1 tablet by mouth at bedtime as needed for moderate constipation.   simethicone  80 MG chewable tablet Commonly known as: MYLICON PO BID x 48 hours and then q6h PRN gas pains   Spacer/Aero-Holding [employer] 1 Units by Does not apply route in the morning, at noon, and at bedtime.        Future Appointments   Date Time Provider Department Center  01/28/2025 10:20 AM Tanda Bleacher, MD [facility] [address]    Bebe Izell Overcast MD Attending Center for [facility])

## 2024-12-01 ENCOUNTER — Encounter: Payer: Self-pay | Admitting: Obstetrics and Gynecology

## 2024-12-03 ENCOUNTER — Ambulatory Visit: Admitting: Obstetrics and Gynecology

## 2024-12-03 ENCOUNTER — Encounter: Payer: Self-pay | Admitting: Obstetrics and Gynecology

## 2024-12-03 VITALS — BP 114/67 | HR 72 | Wt 104.1 lb

## 2024-12-03 DIAGNOSIS — Z09 Encounter for follow-up examination after completed treatment for conditions other than malignant neoplasm: Secondary | ICD-10-CM

## 2024-12-03 NOTE — Patient Instructions (Signed)
 Okay to try sexual intercourse on Jan 11, 2025

## 2024-12-03 NOTE — Progress Notes (Signed)
 Obstetrics and Gynecology Visit Return Patient Evaluation  Appointment Date: 12/03/2024  Primary Care Provider: Tanda Bleacher  OBGYN Clinic: Center for [facility]  Chief Complaint: Scheduled post op check  History of Present Illness:  Kim [name] is a 34 y.o. s/p 10/20 TLH/BS/cysto for CPP, AUB. Patient discharged to home on POD#1 and surg path negative  Interval History: Since that time, she states that she's doing well. She had some initial constipation with pulling sensation but currently no s/s.   Review of Systems:as noted in the History of Present Illness. 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 [name] had no medications administered during this visit. Current Outpatient Medications  Medication Sig Dispense Refill   albuterol  (VENTOLIN  HFA) 108 (90 Base) MCG/ACT inhaler Inhale 2 puffs into the lungs every 6 (six) hours as needed for wheezing or shortness of breath. 8 g 0   aspirin -acetaminophen -caffeine (EXCEDRIN MIGRAINE) 250-250-65 MG tablet Take 1-2 tablets by mouth every 6 (six) hours as needed for migraine.     gabapentin  (NEURONTIN ) 300 MG capsule Take 1 capsule (300 mg total) by mouth 2 (two) times daily for 7 days. 14 capsule 0   ibuprofen  (ADVIL )  600 MG tablet Take 1 tablet (600 mg total) by mouth every 6 (six) hours as needed for mild pain (pain score 1-3) or cramping. 30 tablet 0   oxyCODONE -acetaminophen  (PERCOCET/ROXICET) 5-325 MG tablet Take 1-2 tablets by mouth every 6 (six) hours as needed. 30 tablet 0   senna-docusate (SENOKOT-S) 8.6-50 MG tablet Take 1 tablet by mouth at bedtime as needed for moderate constipation. 3 tablet 0   simethicone  (MYLICON) 80 MG chewable tablet PO BID x 48 hours and then q6h PRN gas pains 30 tablet 0   Spacer/Aero-Holding [employer] 1 Units by Does not apply route in the morning, at noon, and at bedtime. 1 Units 0   No current facility-administered medications for this visit.    Allergies: has no known allergies.  Physical Exam:  BP 114/67   Pulse 72   Wt 104 lb 2 oz (47.2 kg)   BMI 15.83 kg/m  Body mass index is 15.83 kg/m. General appearance: Well nourished, well developed female in no acute distress.  Abdomen: diffusely non tender to palpation, non distended, and no masses, hernias. Well healed l/s port sites Neuro/Psych:  Normal mood and affect.   Pelvic exam: VULVA: normal appearing vulva with no masses, tenderness or lesions, VAGINA: normal appearing vagina with normal color and discharge, no lesions, CERVIX: surgically absent, UTERUS: surgically absent, vaginal cuff well healed, with some sutures still visible. ADNEXA: normal adnexa in size, nontender and no masses, RECTAL: deferred, exam chaperoned by CMA.  Assessment: patient doing well  Plan:  1. Postop check (Primary) Routine care. Okay to come off all restrictions except no intercourse for a full 12  weeks (mid January).    Return if symptoms worsen or fail to improve.  Future Appointments  Date Time Provider Department Center  01/28/2025 10:20 AM Tanda Bleacher, MD [facility] [address]    Bebe Izell Raddle MD Attending Center for [facility])

## 2024-12-18 ENCOUNTER — Encounter: Payer: Self-pay | Admitting: Medical

## 2025-01-28 ENCOUNTER — Encounter: Payer: Medicaid Other | Admitting: Family Medicine

## 2025-02-23 ENCOUNTER — Encounter: Admitting: Family Medicine
